# Patient Record
Sex: Male | Born: 1960 | Race: White | Hispanic: No | Marital: Married | Smoking: Former smoker
Health system: Southern US, Community
[De-identification: ages and names within clinical notes are randomized; demographics above are authoritative.]

## PROBLEM LIST (undated history)

## (undated) DIAGNOSIS — Z9861 Coronary angioplasty status: Secondary | ICD-10-CM

## (undated) DIAGNOSIS — M545 Low back pain, unspecified: Secondary | ICD-10-CM

## (undated) DIAGNOSIS — M179 Osteoarthritis of knee, unspecified: Secondary | ICD-10-CM

## (undated) DIAGNOSIS — M1710 Unilateral primary osteoarthritis, unspecified knee: Secondary | ICD-10-CM

## (undated) DIAGNOSIS — G5603 Carpal tunnel syndrome, bilateral upper limbs: Secondary | ICD-10-CM

## (undated) DIAGNOSIS — J449 Chronic obstructive pulmonary disease, unspecified: Principal | ICD-10-CM

## (undated) DIAGNOSIS — I2510 Atherosclerotic heart disease of native coronary artery without angina pectoris: Secondary | ICD-10-CM

## (undated) DIAGNOSIS — I213 ST elevation (STEMI) myocardial infarction of unspecified site: Secondary | ICD-10-CM

## (undated) HISTORY — DX: Atherosclerotic heart disease of native coronary artery without angina pectoris: I25.10

## (undated) HISTORY — DX: Low back pain: M54.5

## (undated) HISTORY — DX: ST elevation (STEMI) myocardial infarction of unspecified site: I21.3

## (undated) HISTORY — DX: Chronic obstructive pulmonary disease, unspecified: J44.9

## (undated) HISTORY — DX: Carpal tunnel syndrome, bilateral upper limbs: G56.03

## (undated) HISTORY — PX: HERNIA REPAIR: SHX51

## (undated) HISTORY — DX: Low back pain, unspecified: M54.50

## (undated) HISTORY — DX: Unilateral primary osteoarthritis, unspecified knee: M17.10

## (undated) HISTORY — DX: Coronary angioplasty status: Z98.61

## (undated) HISTORY — DX: Osteoarthritis of knee, unspecified: M17.9

## (undated) HISTORY — PX: TONSILLECTOMY: SUR1361

---

## 2005-04-26 HISTORY — PX: POSTERIOR LAMINECTOMY / DECOMPRESSION LUMBAR SPINE: SUR740

## 2005-08-11 ENCOUNTER — Ambulatory Visit (HOSPITAL_COMMUNITY): Admission: RE | Admit: 2005-08-11 | Discharge: 2005-08-11 | Payer: Self-pay | Admitting: Neurosurgery

## 2005-09-08 ENCOUNTER — Ambulatory Visit (HOSPITAL_COMMUNITY): Admission: RE | Admit: 2005-09-08 | Discharge: 2005-09-09 | Payer: Self-pay | Admitting: Neurosurgery

## 2007-03-28 ENCOUNTER — Encounter: Admission: RE | Admit: 2007-03-28 | Discharge: 2007-03-28 | Payer: Self-pay | Admitting: General Surgery

## 2007-03-30 ENCOUNTER — Ambulatory Visit (HOSPITAL_BASED_OUTPATIENT_CLINIC_OR_DEPARTMENT_OTHER): Admission: RE | Admit: 2007-03-30 | Discharge: 2007-03-30 | Payer: Self-pay | Admitting: General Surgery

## 2009-03-14 ENCOUNTER — Encounter: Admission: RE | Admit: 2009-03-14 | Discharge: 2009-03-14 | Payer: Self-pay | Admitting: Internal Medicine

## 2010-09-08 NOTE — Op Note (Signed)
Gordon Hayes, Gordon Hayes               ACCOUNT NO.:  [id number]   MEDICAL RECORD NO.:  [id number]          PATIENT TYPE:  AMB   LOCATION:  DSC                          FACILITY:  [facility]   PHYSICIAN:  Cherylynn Ridges, M.D.    DATE OF BIRTH:  Jun 17, 1960   DATE OF PROCEDURE:  03/30/2007  DATE OF DISCHARGE:                               OPERATIVE REPORT   PREOPERATIVE DIAGNOSIS:  Right inguinal hernia.   POSTOPERATIVE DIAGNOSIS:  Indirect right inguinal hernia.   PROCEDURE:  Repair of right inguinal hernia with mesh.   SURGEON:  Cherylynn Ridges, M.D.   ASSISTANT:  None.   ANESTHESIA:  General with a laryngeal airway.   ESTIMATED BLOOD LOSS:  Less than 20 mL.   COMPLICATIONS:  None.   CONDITION:  Good.   INDICATIONS FOR OPERATION:  The patient is a 50 year old with a  symptomatic right inguinal hernia who comes in for repair.   FINDINGS:  The patient had an indirect sac which was moderate in size.  The floor was weakened but not significantly herniated.   OPERATION:  The patient was taken to the operating room, placed on the  table in supine position.  After adequate general laryngeal airway  anesthetic was administered, he was prepped and draped in the usual  sterile manner exposing the right lower quadrant and right inguinal  area.   A transverse curvilinear incision was made using #10 blade into the  subcutaneous tissue at the level superficial ring.  He was taken down  through the subcutaneous tissue to the fascia of the external oblique  which was incised with cautery medially which had to be repaired with a  running 3-0 Vicryl suture.  We then exposed the fascia extending down to  the superficial ring.  We nicked the fascia along its fibers and then  opened up the fascia down through the superficial ring and then  proximally using Metzenbaum scissors.  We mobilized the spermatic cord  along with the hernia sac at the pubic tubercle and mobilized it with a  Penrose drain.  We  did not place it up on a work bench using the Penrose  drain and dissected the sac away from the anterior medial aspect of the  spermatic cord away from the spermatic cord itself.  A moderate sized  scarred sac was excised and we subsequently suture ligated it at its  base using interrupted 0 Ethibond sutures.  A total of two suture  ligatures were used at the base of the sac.  Once this was excised and  allowed to retract back into the internal ring, we placed an oval piece  of mesh measuring approximately 4 x 2 cm in size, attaching it to the  pubic tubercle and the conjoined tendon anterior medially and the  reflected portion of inguinal ligament inferolaterally, both running 0  Prolene sutures were used to attach the mesh.  The mesh had been soaked  in antibiotic solution prior to being implanted.  Once we had completed  the repair there, we closed the fascia on top of the spermatic cord  using a running 3-0 Vicryl suture.  We irrigated with antibiotic  solution again.  We closed Scarpa's fascia using interrupted 3-0 Vicryl  sutures.  We injected 50% Marcaine without epinephrine into the wound, a  total of 17-18 mL were used including a partial regional block at the  anterior-superior iliac spine.  The skin was closed using a running  subcuticular stitch of 4-0 Monocryl.  We applied Dermabond and Steri-  Strips and a Tegaderm dressing for closure.  All counts were correct.      Cherylynn Ridges, M.D.  Electronically Signed     JOW/MEDQ  D:  03/30/2007  T:  03/30/2007  Job:  [id number]   cc:   Brett Canales A. Cleta Alberts, M.D.

## 2010-09-11 NOTE — Op Note (Signed)
NAMEDONACIANO, RANGE NO.:  [id number]   MEDICAL RECORD NO.:  [id number]          PATIENT TYPE:  OIB   LOCATION:  3021                         FACILITY:  [facility]   PHYSICIAN:  Hewitt Shorts, M.D.DATE OF BIRTH:  1960/07/12   DATE OF PROCEDURE:  09/08/2005  DATE OF DISCHARGE:                                 OPERATIVE REPORT   PREOPERATIVE DIAGNOSES:  1.  Bilateral L4-5 lumbar disk herniation.  2.  Lumbar degenerative disk disease.  3.  Lumbar spondylosis and bilateral lumbar radiculopathy.   POSTOPERATIVE DIAGNOSES:  1.  Bilateral L4-5 lumbar disk herniation.  2.  Lumbar degenerative disk disease.  3.  Lumbar spondylosis and bilateral lumbar radiculopathy.   PROCEDURE:  1.  Left L4-5 hemilaminectomy and left L4-5 diskectomy with microdissection      with decompression of the left L4-5 nerve roots.  2.  Right L4 hemilaminotomy and right L4-5 diskectomy with microdissection      and decompression of the right L5 nerve root.   SURGEON:  Hewitt Shorts, M.D.   ASSISTANT:  Danae Orleans. Venetia Maxon, M.D.   ANESTHESIA:  General endotracheal.   INDICATIONS:  Patient is a 50 year old man who presented with bilateral  lumbar radiculopathy, right worse than left.  He was evaluated with an MRI  scan and myelogram and post myelogram CT scan.  This showed a large L4-5  disk herniation with bilateral compression at the L4-5 level, somewhat worse  on the left than the right side.  As well, he had a large fragment that had  migrated rostrally behind the body of L4 and was extending into the left L4-  5 neural foramen, compressing the exiting left L4 nerve roots.  The decision  was made to proceed with decompression.   PROCEDURE:  The patient was brought to the operating room, placed under  general endotracheal anesthesia.  The patient was turned to the prone  position.  The lumbar region was prepped with Betadine soap and solution and  draped in a sterile fashion.  The  midline was infiltrated with local  anesthetic with epinephrine.  An x-ray was taken.  The L4-5 level  identified, and the midline incision is made, carried down through the  subcutaneous tissue.  Bipolar cautery, electrocautery was used to maintain  hemostasis.  Dissection was carried down to the lumbar fascia, which was  incised bilaterally, and the paraspinous muscles with dissection of the  spinous process of the lamina in a subperiosteal fashion.  Greater exposure  was carried out on the left side so that we could expose the left L4 nerve  root.  The microscope was then draped and brought into the field to provide  additional magnification and illumination and visualization, and  decompression was performed using microdissection and microsurgical  technique.   A left L4 hemilaminectomy was performed, and a right L4 hemilaminotomy was  performed using the ExMax drill and Kerrison punches. The edges of the bone  were waxed as needed to maintain hemostasis.  The edges of the bone were  also rounded.  We then examined and identified the  L5 nerve roots  bilaterally as well as the left L4 nerve root as it exited into the neural  foramen.  Bilateral disk herniation was noted at the L4-5 level as well as a  large disk herniation within the left L4-5 neural foramen.  Diskectomy was  performed bilaterally at the L4-5 level with incision of the annulus  continued with a variety of microcurettes and pituitary rongeurs.  Thorough  diskectomy  was performed with removal of all loose disk fragments of both  the disk space and the epidural space with good decompression of the thecal  sac and the L5 nerve roots at the level of the disk space.  We then turned  our attention to the L4 nerve root on the left side.  The disk herniation  was identified.  This was carefully removed with decompression of the  exiting left L4 nerve root.  A thorough diskectomy was performed, with  again, removal of all disk  fragments from the epidural space and disk space.  Good decompression of the thecal sac and nerve root were achieved.  Then we  established hemostasis with the use of bipolar cautery as well as Gelfoam  soaked in thrombin providing closure.  All of the Gelfoam was removed, and  good hemostasis was confirmed.  We then instilled 2cc of fentanyl and 80 mg  of Depo-Medrol into the epidural space and then proceeded with closure.  The  deep fascia was closed with interrupted, undyed #1 Vicryl sutures, and  Scarpa's fascia was closed with interrupted inverted, undyed #1 Vicryl  sutures, and the subcutaneous and subcuticular layers closed with  interrupted, inverted 2-0 Vicryl sutures.  The skin was reapproximated with  Dermabond, and the wound was dressed with Adaptic and sterile gauze.  Following surgery, the patient was turned back to a supine position to be  reversed, and was extubated and transferred to the recovery room for further  care.      Hewitt Shorts, M.D.  Electronically Signed     RWN/MEDQ  D:  09/08/2005  T:  09/08/2005  Job:  [id number]

## 2011-02-01 LAB — DIFFERENTIAL
Eosinophils Absolute: 0.1 — ABNORMAL LOW
Monocytes Absolute: 0.5
Neutro Abs: 4

## 2011-02-01 LAB — CBC
Hemoglobin: 14.7
MCHC: 34.4
MCV: 91.7
WBC: 6.7

## 2011-04-27 DIAGNOSIS — J449 Chronic obstructive pulmonary disease, unspecified: Secondary | ICD-10-CM

## 2011-04-27 HISTORY — DX: Chronic obstructive pulmonary disease, unspecified: J44.9

## 2011-09-29 ENCOUNTER — Ambulatory Visit: Payer: BC Managed Care – PPO

## 2011-09-29 ENCOUNTER — Ambulatory Visit (INDEPENDENT_AMBULATORY_CARE_PROVIDER_SITE_OTHER): Payer: BC Managed Care – PPO | Admitting: Family Medicine

## 2011-09-29 VITALS — BP 116/79 | HR 75 | Temp 98.6°F | Resp 16 | Ht 69.0 in | Wt 181.0 lb

## 2011-09-29 DIAGNOSIS — R05 Cough: Secondary | ICD-10-CM

## 2011-09-29 DIAGNOSIS — R059 Cough, unspecified: Secondary | ICD-10-CM

## 2011-09-29 DIAGNOSIS — R062 Wheezing: Secondary | ICD-10-CM

## 2011-09-29 DIAGNOSIS — H6120 Impacted cerumen, unspecified ear: Secondary | ICD-10-CM

## 2011-09-29 DIAGNOSIS — J40 Bronchitis, not specified as acute or chronic: Secondary | ICD-10-CM

## 2011-09-29 LAB — POCT CBC
Granulocyte percent: 69 %G (ref 37–80)
HCT, POC: 51.5 % (ref 43.5–53.7)
Hemoglobin: 16.8 g/dL (ref 14.1–18.1)
Lymph, poc: 2.2 (ref 0.6–3.4)
MCH, POC: 30.2 pg (ref 27–31.2)
MCHC: 32.6 g/dL (ref 31.8–35.4)
MCV: 92.6 fL (ref 80–97)
MID (cbc): 1 — AB (ref 0–0.9)
MPV: 9.2 fL (ref 0–99.8)
POC Granulocyte: 7.2 — AB (ref 2–6.9)
POC LYMPH PERCENT: 21 %L (ref 10–50)
POC MID %: 10 %M (ref 0–12)
Platelet Count, POC: 300 10*3/uL (ref 142–424)
RBC: 5.56 M/uL (ref 4.69–6.13)
RDW, POC: 14.6 %
WBC: 10.5 10*3/uL — AB (ref 4.6–10.2)

## 2011-09-29 LAB — GLUCOSE, POCT (MANUAL RESULT ENTRY): POC Glucose: 65 mg/dl — AB (ref 70–99)

## 2011-09-29 MED ORDER — PREDNISONE 20 MG PO TABS: ORAL_TABLET | ORAL | Status: AC

## 2011-09-29 MED ORDER — IPRATROPIUM BROMIDE 0.02 % IN SOLN
0.5000 mg | Freq: Once | RESPIRATORY_TRACT | Status: AC
  Administered 2011-09-29: 0.5 mg via RESPIRATORY_TRACT

## 2011-09-29 MED ORDER — LEVOFLOXACIN 750 MG PO TABS: 750.0000 mg | ORAL_TABLET | Freq: Every day | ORAL | Status: AC

## 2011-09-29 MED ORDER — ALBUTEROL SULFATE HFA 108 (90 BASE) MCG/ACT IN AERS: 2.0000 | INHALATION_SPRAY | RESPIRATORY_TRACT | Status: DC | PRN

## 2011-09-29 MED ORDER — ALBUTEROL SULFATE (2.5 MG/3ML) 0.083% IN NEBU
2.5000 mg | INHALATION_SOLUTION | Freq: Once | RESPIRATORY_TRACT | Status: AC
  Administered 2011-09-29: 2.5 mg via RESPIRATORY_TRACT

## 2011-09-29 NOTE — Progress Notes (Signed)
  Subjective:    Patient ID: Gordon Hayes, male    DOB: October 19, 1960, 51 y.o.   MRN: [id number]  HPI Patient presents with cough, chest tightness, and wheezing since 09/26/11. Also complains of fatigue but this has been present for "months" due to long hours. He has been taking dayquil which helped with his upper respiratory symptoms but now he has tightness and pressure in his chest. Denies fever, chills, nausea, vomiting, otalgia, sinus pain/pressure, or dizziness.     Review of Systems  All other systems reviewed and are negative.       Objective:   Physical Exam  Vitals reviewed. Constitutional: He is oriented to person, place, and time. He appears well-developed and well-nourished.  HENT:  Head: Normocephalic and atraumatic.  Right Ear: Hearing, tympanic membrane and external ear normal.  Left Ear: Hearing, tympanic membrane, external ear and ear canal normal.  Mouth/Throat: No oropharyngeal exudate.       Right cerumen impaction. Normal TM s/p ear irrigation  Eyes: Conjunctivae are normal.  Neck: Neck supple.  Cardiovascular: Normal rate, regular rhythm and normal heart sounds.   Pulmonary/Chest: Effort normal. He has wheezes.       Wheezes improved s/p atrovent/albuterol nebulizer tx  Lymphadenopathy:    He has no cervical adenopathy.  Neurological: He is alert and oriented to person, place, and time.  Psychiatric: He has a normal mood and affect. His behavior is normal. Judgment and thought content normal.    [facility] reading (PRIMARY) by  Dr. Georgiana Shore as increased markings, no acute infiltration or consolidation.   Results for orders placed in visit on 09/29/11  POCT CBC      Component Value Range   WBC 10.5 (*) 4.6 - 10.2 (K/uL)   Lymph, poc 2.2  0.6 - 3.4    POC LYMPH PERCENT 21.0  10 - 50 (%L)   MID (cbc) 1.0 (*) 0 - 0.9    POC MID % 10.0  0 - 12 (%M)   POC Granulocyte 7.2 (*) 2 - 6.9    Granulocyte percent 69.0  37 - 80 (%G)   RBC 5.56  4.69 - 6.13 (M/uL)   Hemoglobin 16.8  14.1 - 18.1 (g/dL)   HCT, POC 78.2  95.6 - 53.7 (%)   MCV 92.6  80 - 97 (fL)   MCH, POC 30.2  27 - 31.2 (pg)   MCHC 32.6  31.8 - 35.4 (g/dL)   RDW, POC 21.3     Platelet Count, POC 300  142 - 424 (K/uL)   MPV 9.2  0 - 99.8 (fL)  GLUCOSE, POCT (MANUAL RESULT ENTRY)      Component Value Range   POC Glucose 65 (*) 70 - 99 (mg/dl)    Patient reports improvement of chest tightness and breathing after nebulizer. PO2 increased to 96% after nebulizer.      Assessment & Plan:   1. Cough  Bronchitis or possible early pneumonia. Will start Levaquin 750 mg daily x 5 days DG Chest 2 View, POCT CBC  2. Wheezing  Use albuterol as needed for wheezing/chest tightness ipratropium (ATROVENT) nebulizer solution 0.5 mg, albuterol (PROVENTIL) (2.5 MG/3ML) 0.083% nebulizer solution 2.5 mg  3. Bronchitis  Return for re-evaluation if no improvement in 48 hours, sooner if worse.

## 2011-09-29 NOTE — Patient Instructions (Addendum)
Discontinue use of Q-tips, hair pins, keys, etc.   To help prevent cerumen impaction: While in the washing hair in the shower, allow soapy water to run into ear canal for a few minutes and then rinse (it dissolves the wax like a dishwasher dissolves grease).   May also use half hydrogen peroxide half water in the shower 2-3 times per week to help rinse out the wax. DO NOT USE 100% HYDROGEN PEROXIDE (this can burn the skin).   Also, several drops of sweet oil can be used to help prevent itching of the canal. If this is not enough to decrease itch, you may put a small amount of OTC hydrocortisone cream on pinky finger and apply to portion of canal that can be reached with tip of finger.  Recommend OTC Debrox or Colace to prevent impaction.    Smoking Cessation This document explains the best ways for you to quit smoking and new treatments to help. It lists new medicines that can double or triple your chances of quitting and quitting for good. It also considers ways to avoid relapses and concerns you may have about quitting, including weight gain. NICOTINE: A POWERFUL ADDICTION If you have tried to quit smoking, you know how hard it can be. It is hard because nicotine is a very addictive drug. For some people, it can be as addictive as heroin or cocaine. Usually, people make 2 or 3 tries, or more, before finally being able to quit. Each time you try to quit, you can learn about what helps and what hurts. Quitting takes hard work and a lot of effort, but you can quit smoking. QUITTING SMOKING IS ONE OF THE MOST IMPORTANT THINGS YOU WILL EVER DO.  You will live longer, feel better, and live better.   The impact on your body of quitting smoking is felt almost immediately:   Within 20 minutes, blood pressure decreases. Pulse returns to its normal level.   After 8 hours, carbon monoxide levels in the blood return to normal. Oxygen level increases.   After 24 hours, chance of heart attack starts to  decrease. Breath, hair, and body stop smelling like smoke.   After 48 hours, damaged nerve endings begin to recover. Sense of taste and smell improve.   After 72 hours, the body is virtually free of nicotine. Bronchial tubes relax and breathing becomes easier.   After 2 to 12 weeks, lungs can hold more air. Exercise becomes easier and circulation improves.   Quitting will reduce your risk of having a heart attack, stroke, cancer, or lung disease:   After 1 year, the risk of coronary heart disease is cut in half.   After 5 years, the risk of stroke falls to the same as a nonsmoker.   After 10 years, the risk of lung cancer is cut in half and the risk of other cancers decreases significantly.   After 15 years, the risk of coronary heart disease drops, usually to the level of a nonsmoker.   If you are pregnant, quitting smoking will improve your chances of having a healthy baby.   The people you live with, especially your children, will be healthier.   You will have extra money to spend on things other than cigarettes.  FIVE KEYS TO QUITTING Studies have shown that these 5 steps will help you quit smoking and quit for good. You have the best chances of quitting if you use them together: 1. Get ready.  2. Get support and encouragement.  3. Learn new skills and behaviors.  4. Get medicine to reduce your nicotine addiction and use it correctly.  5. Be prepared for relapse or difficult situations. Be determined to continue trying to quit, even if you do not succeed at first.  1. GET READY  Set a quit date.   Change your environment.   Get rid of ALL cigarettes, ashtrays, matches, and lighters in your home, car, and place of work.   Do not let people smoke in your home.   Review your past attempts to quit. Think about what worked and what did not.   Once you quit, do not smoke. NOT EVEN A PUFF!  2. GET SUPPORT AND ENCOURAGEMENT Studies have shown that you have a better chance of  being successful if you have help. You can get support in many ways.  Tell your family, friends, and coworkers that you are going to quit and need their support. Ask them not to smoke around you.   Talk to your caregivers (doctor, dentist, nurse, pharmacist, psychologist, and/or smoking counselor).   Get individual, group, or telephone counseling and support. The more counseling you have, the better your chances are of quitting. Programs are available at [employer] and health centers. Call your local health department for information about programs in your area.   Spiritual beliefs and practices may help some smokers quit.   Quit meters are Photographer that keep track of quit statistics, such as amount of "quit-time," cigarettes not smoked, and money saved.   Many smokers find one or more of the many self-help books available useful in helping them quit and stay off tobacco.  3. LEARN NEW SKILLS AND BEHAVIORS  Try to distract yourself from urges to smoke. Talk to someone, go for a walk, or occupy your time with a task.   When you first try to quit, change your routine. Take a different route to work. Drink tea instead of coffee. Eat breakfast in a different place.   Do something to reduce your stress. Take a hot bath, exercise, or read a book.   Plan something enjoyable to do every day. Reward yourself for not smoking.   Explore interactive web-based programs that specialize in helping you quit.  4. GET MEDICINE AND USE IT CORRECTLY Medicines can help you stop smoking and decrease the urge to smoke. Combining medicine with the above behavioral methods and support can quadruple your chances of successfully quitting smoking. The [place] Food and Drug Administration (FDA) has approved 7 medicines to help you quit smoking. These medicines fall into 3 categories.  Nicotine replacement therapy (delivers nicotine to your body without the negative effects and  risks of smoking):   Nicotine gum: Available over-the-counter.   Nicotine lozenges: Available over-the-counter.   Nicotine inhaler: Available by prescription.   Nicotine nasal spray: Available by prescription.   Nicotine skin patches (transdermal): Available by prescription and over-the-counter.   Antidepressant medicine (helps people abstain from smoking, but how this works is unknown):   Bupropion sustained-release (SR) tablets: Available by prescription.   Nicotinic receptor partial agonist (simulates the effect of nicotine in your brain):   Varenicline tartrate tablets: Available by prescription.   Ask your caregiver for advice about which medicines to use and how to use them. Carefully read the information on the package.   Everyone who is trying to quit may benefit from using a medicine. If you are pregnant or trying to become pregnant, nursing an infant, you  are under age 61, or you smoke fewer than 10 cigarettes per day, talk to your caregiver before taking any nicotine replacement medicines.   You should stop using a nicotine replacement product and call your caregiver if you experience nausea, dizziness, weakness, vomiting, fast or irregular heartbeat, mouth problems with the lozenge or gum, or redness or swelling of the skin around the patch that does not go away.   Do not use any other product containing nicotine while using a nicotine replacement product.   Talk to your caregiver before using these products if you have diabetes, heart disease, asthma, stomach ulcers, you had a recent heart attack, you have high blood pressure that is not controlled with medicine, a history of irregular heartbeat, or you have been prescribed medicine to help you quit smoking.  5. BE PREPARED FOR RELAPSE OR DIFFICULT SITUATIONS  Most relapses occur within the first 3 months after quitting. Do not be discouraged if you start smoking again. Remember, most people try several times before they  finally quit.   You may have symptoms of withdrawal because your body is used to nicotine. You may crave cigarettes, be irritable, feel very hungry, cough often, get headaches, or have difficulty concentrating.   The withdrawal symptoms are only temporary. They are strongest when you first quit, but they will go away within 10 to 14 days.  Here are some difficult situations to watch for:  Alcohol. Avoid drinking alcohol. Drinking lowers your chances of successfully quitting.   Caffeine. Try to reduce the amount of caffeine you consume. It also lowers your chances of successfully quitting.   Other smokers. Being around smoking can make you want to smoke. Avoid smokers.   Weight gain. Many smokers will gain weight when they quit, usually less than 10 pounds. Eat a healthy diet and stay active. Do not let weight gain distract you from your main goal, quitting smoking. Some medicines that help you quit smoking may also help delay weight gain. You can always lose the weight gained after you quit.   Bad mood or depression. There are a lot of ways to improve your mood other than smoking.  If you are having problems with any of these situations, talk to your caregiver. SPECIAL SITUATIONS AND CONDITIONS Studies suggest that everyone can quit smoking. Your situation or condition can give you a special reason to quit.  Pregnant women/new mothers: By quitting, you protect your baby's health and your own.   Hospitalized patients: By quitting, you reduce health problems and help healing.   Heart attack patients: By quitting, you reduce your risk of a second heart attack.   Lung, head, and neck cancer patients: By quitting, you reduce your chance of a second cancer.   Parents of children and adolescents: By quitting, you protect your children from illnesses caused by secondhand smoke.  QUESTIONS TO THINK ABOUT Think about the following questions before you try to stop smoking. You may want to talk  about your answers with your caregiver.  Why do you want to quit?   If you tried to quit in the past, what helped and what did not?   What will be the most difficult situations for you after you quit? How will you plan to handle them?   Who can help you through the tough times? Your family? Friends? Caregiver?   What pleasures do you get from smoking? What ways can you still get pleasure if you quit?  Here are some questions to ask  your caregiver:  How can you help me to be successful at quitting?   What medicine do you think would be best for me and how should I take it?   What should I do if I need more help?   What is smoking withdrawal like? How can I get information on withdrawal?  Quitting takes hard work and a lot of effort, but you can quit smoking. FOR MORE INFORMATION  Smokefree.gov (http://www.davis-sullivan.com/) provides free, accurate, evidence-based information and professional assistance to help support the immediate and long-term needs of people trying to quit smoking. Document Released: 04/06/2001 Document Revised: 04/01/2011 Document Reviewed: 01/27/2009 [facility] Patient Information 2012 [place], [place].

## 2011-09-29 NOTE — Progress Notes (Signed)
  Subjective:    Patient ID: Gordon Hayes, male    DOB: 05-30-1960, 51 y.o.   MRN: [id number]  HPI    Review of Systems     Objective:   Physical Exam        Assessment & Plan:

## 2011-11-23 ENCOUNTER — Ambulatory Visit (INDEPENDENT_AMBULATORY_CARE_PROVIDER_SITE_OTHER): Payer: BC Managed Care – PPO | Admitting: Emergency Medicine

## 2011-11-23 ENCOUNTER — Encounter: Payer: Self-pay | Admitting: Physician Assistant

## 2011-11-23 ENCOUNTER — Ambulatory Visit: Payer: BC Managed Care – PPO

## 2011-11-23 VITALS — BP 130/80 | HR 78 | Temp 98.3°F | Resp 16 | Ht 70.5 in | Wt 183.2 lb

## 2011-11-23 DIAGNOSIS — Z1211 Encounter for screening for malignant neoplasm of colon: Secondary | ICD-10-CM

## 2011-11-23 DIAGNOSIS — R05 Cough: Secondary | ICD-10-CM

## 2011-11-23 DIAGNOSIS — F17200 Nicotine dependence, unspecified, uncomplicated: Secondary | ICD-10-CM

## 2011-11-23 DIAGNOSIS — Z0000 Encounter for general adult medical examination without abnormal findings: Secondary | ICD-10-CM

## 2011-11-23 DIAGNOSIS — Z125 Encounter for screening for malignant neoplasm of prostate: Secondary | ICD-10-CM

## 2011-11-23 DIAGNOSIS — R5381 Other malaise: Secondary | ICD-10-CM

## 2011-11-23 DIAGNOSIS — R5383 Other fatigue: Secondary | ICD-10-CM

## 2011-11-23 DIAGNOSIS — J449 Chronic obstructive pulmonary disease, unspecified: Secondary | ICD-10-CM | POA: Insufficient documentation

## 2011-11-23 DIAGNOSIS — R059 Cough, unspecified: Secondary | ICD-10-CM

## 2011-11-23 DIAGNOSIS — Z720 Tobacco use: Secondary | ICD-10-CM

## 2011-11-23 DIAGNOSIS — Z23 Encounter for immunization: Secondary | ICD-10-CM

## 2011-11-23 DIAGNOSIS — J4489 Other specified chronic obstructive pulmonary disease: Secondary | ICD-10-CM

## 2011-11-23 LAB — COMPREHENSIVE METABOLIC PANEL
ALT: 17 U/L (ref 0–53)
Albumin: 4.3 g/dL (ref 3.5–5.2)
Alkaline Phosphatase: 46 U/L (ref 39–117)
CO2: 21 mEq/L (ref 19–32)
Chloride: 108 mEq/L (ref 96–112)
Creat: 0.87 mg/dL (ref 0.50–1.35)
Sodium: 138 mEq/L (ref 135–145)

## 2011-11-23 LAB — LIPID PANEL
Cholesterol: 162 mg/dL (ref 0–200)
Total CHOL/HDL Ratio: 2.7 Ratio
Triglycerides: 64 mg/dL (ref <150)
VLDL: 13 mg/dL (ref 0–40)

## 2011-11-23 LAB — CBC WITH DIFFERENTIAL/PLATELET
Basophils Absolute: 0 10*3/uL (ref 0.0–0.1)
Basophils Relative: 1 % (ref 0–1)
Eosinophils Absolute: 0.1 10*3/uL (ref 0.0–0.7)
Eosinophils Relative: 1 % (ref 0–5)
HCT: 45.8 % (ref 39.0–52.0)
Monocytes Absolute: 0.4 10*3/uL (ref 0.1–1.0)
Neutro Abs: 3 10*3/uL (ref 1.7–7.7)
Platelets: 270 10*3/uL (ref 150–400)
RBC: 5.15 MIL/uL (ref 4.22–5.81)
RDW: 14.3 % (ref 11.5–15.5)

## 2011-11-23 LAB — POCT UA - MICROSCOPIC ONLY: Casts, Ur, LPF, POC: NEGATIVE

## 2011-11-23 LAB — IFOBT (OCCULT BLOOD): IFOBT: NEGATIVE

## 2011-11-23 LAB — POCT URINALYSIS DIPSTICK
Bilirubin, UA: NEGATIVE
Glucose, UA: NEGATIVE
Nitrite, UA: NEGATIVE
Urobilinogen, UA: 0.2

## 2011-11-23 LAB — TSH: TSH: 1.108 u[IU]/mL (ref 0.350–4.500)

## 2011-11-23 MED ORDER — TIOTROPIUM BROMIDE MONOHYDRATE 18 MCG IN CAPS: 18.0000 ug | ORAL_CAPSULE | Freq: Every day | RESPIRATORY_TRACT | Status: DC

## 2011-11-23 NOTE — Progress Notes (Signed)
Subjective:    Patient ID: Gordon Hayes, male    DOB: May 15, 1960, 51 y.o.   MRN: [id number]  HPI This 51 y.o. Male presents for CPE.  Review of Systems  Constitutional: Negative.   HENT: Negative.  Rhinorrhea: with lawn mowing.   Eyes: Negative.   Respiratory: Positive for cough (daily).        Dyspnea with exertion.  Cardiovascular: Negative.   Gastrointestinal: Negative.   Genitourinary: Negative.   Musculoskeletal: Positive for arthralgias (hands, knees, back).  Skin: Negative.   Neurological: Negative.   Hematological: Negative.   Psychiatric/Behavioral: Negative.     Past Medical History  Diagnosis Date  . Back pain, lumbosacral   . DJD (degenerative joint disease) of knee   . Carpal tunnel syndrome, bilateral     Past Surgical History  Procedure Date  . Posterior laminectomy / decompression lumbar spine 2007  . Hernia repair     right inguinal  . Tonsillectomy     Prior to Admission medications   Medication Sig Start Date End Date Taking? Authorizing Provider  albuterol (PROVENTIL HFA;VENTOLIN HFA) 108 (90 BASE) MCG/ACT inhaler Inhale 2 puffs into the lungs every 4 (four) hours as needed for wheezing (cough, shortness of breath or wheezing.). 09/29/11 09/28/12 Yes Heather Jaquita Rector, PA-C   Allergies  Allergen Reactions  . Penicillins Anaphylaxis    History   Social History  . Marital Status: Married    Spouse Name: Asher Muir    Number of Children: 0  . 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 Smoking status: Current Everyday Smoker -- 1.5 packs/day    Types: Cigarettes  . Smokeless tobacco: Former Neurosurgeon   Comment: trying to-"you gotta have some will power.  I don't have my mind right to do it yet."  . Alcohol Use: 0.0 - 2.4 oz/week    0-4 Cans of beer per week  . Drug Use: No  . Sexually Active: Yes -- Male partner(s)   Family History  Problem Relation Age of Onset  . Hearing  loss Mother   . Cancer Father 87    prostate, bladder  . Heart disease Father   . Stroke Father   . Cancer Maternal Grandfather   . Cancer Paternal Grandmother   . Heart disease Paternal Grandfather       Objective:   Physical Exam  Vitals reviewed. Constitutional: He is oriented to person, place, and time. Vital signs are normal. He appears well-developed and well-nourished.  Non-toxic appearance. He does not have a sickly appearance. He does not appear ill. No distress.  HENT:  Head: Normocephalic and atraumatic. No trismus in the jaw.  Right Ear: Hearing, tympanic membrane, external ear and ear canal normal.  Left Ear: Hearing, tympanic membrane, external ear and ear canal normal.  Nose: Nose normal.  Mouth/Throat: Uvula is midline, oropharynx is clear and moist and mucous membranes are normal. He does not have dentures. No oral lesions. Normal dentition. No dental abscesses, uvula swelling, lacerations or dental caries.  Eyes: Conjunctivae and EOM are normal. Pupils are equal, round, and reactive to light. Right eye exhibits no discharge. Left eye exhibits no discharge. No scleral icterus.  Fundoscopic exam:      The right eye shows no arteriolar narrowing, no AV nicking, no exudate, no hemorrhage and no papilledema. The right eye shows red reflex.The right eye shows no venous pulsations.  The left eye shows no arteriolar narrowing, no AV nicking, no exudate, no hemorrhage and no papilledema. The left eye shows red reflex.The left eye shows no venous pulsations. Neck: Normal range of motion, full passive range of motion without pain and phonation normal. Neck supple. No spinous process tenderness and no muscular tenderness present. No rigidity. No tracheal deviation, no edema, no erythema and normal range of motion present. No thyromegaly present.  Cardiovascular: Normal rate, regular rhythm, S1 normal, S2 normal, normal heart sounds, intact distal pulses and normal pulses.  Exam  reveals no gallop and no friction rub.   No murmur heard. Pulmonary/Chest: Effort normal and breath sounds normal. No respiratory distress. He has no wheezes. He has no rales.  Abdominal: Soft. Normal appearance and bowel sounds are normal. He exhibits no distension and no mass. There is no hepatosplenomegaly. There is no tenderness. There is no rebound and no guarding. No hernia. Hernia confirmed negative in the right inguinal area and confirmed negative in the left inguinal area.  Genitourinary: Rectum normal, prostate normal, testes normal and penis normal. Guaiac negative stool. No phimosis, paraphimosis, hypospadias, penile erythema or penile tenderness. No discharge found.  Musculoskeletal: Normal range of motion. He exhibits no edema and no tenderness.       Right shoulder: Normal.       Left shoulder: Normal.       Right elbow: Normal.      Left elbow: Normal.       Right wrist: Normal.       Left wrist: Normal.       Right hip: Normal.       Left hip: Normal.       Right knee: Normal.       Left knee: Normal.       Right ankle: Normal. Achilles tendon normal.       Left ankle: Normal. Achilles tendon normal.       Cervical back: Normal. He exhibits normal range of motion, no tenderness, no bony tenderness, no swelling, no edema, no deformity, no laceration, no pain, no spasm and normal pulse.       Thoracic back: Normal.       Lumbar back: Normal.       Right upper arm: Normal.       Left upper arm: Normal.       Right forearm: Normal.       Left forearm: Normal.       Right hand: Normal.       Left hand: Normal.       Right upper leg: Normal.       Left upper leg: Normal.       Right lower leg: Normal.       Left lower leg: Normal.       Right foot: Normal.       Left foot: Normal.  Lymphadenopathy:       Head (right side): No submental, no submandibular, no tonsillar, no preauricular, no posterior auricular and no occipital adenopathy present.       Head (left side): No  submental, no submandibular, no tonsillar, no preauricular, no posterior auricular and no occipital adenopathy present.    He has no cervical adenopathy.       Right: No inguinal and no supraclavicular adenopathy present.       Left: No inguinal and no supraclavicular adenopathy present.  Neurological: He is alert and oriented to person, place, and time. He has normal strength and  normal reflexes. He displays no tremor. No cranial nerve deficit. He exhibits normal muscle tone. Coordination and gait normal.  Skin: Skin is warm, dry and intact. No abrasion, no ecchymosis, no laceration, no lesion and no rash noted. He is not diaphoretic. No cyanosis or erythema. No pallor. Nails show no clubbing.  Psychiatric: He has a normal mood and affect. His speech is normal and behavior is normal. Judgment and thought content normal. Cognition and memory are normal.   CXR: [facility] reading (PRIMARY) by  Dr. Cleta Alberts.  Increased markings in the right hilar region, seen on both PA and lateral views are concerning given his smoking history.  On lateral view anterior markings are unusual, opacity like a curtain.  Spirometry reveals severe obstruction.  Results for orders placed in visit on 11/23/11  IFOBT (OCCULT BLOOD)      Component Value Range   IFOBT Negative    POCT UA - MICROSCOPIC ONLY      Component Value Range   WBC, Ur, HPF, POC neg     RBC, urine, microscopic 0-3     Bacteria, U Microscopic neg     Mucus, UA trace     Epithelial cells, urine per micros 0-1     Crystals, Ur, HPF, POC neg     Casts, Ur, LPF, POC neg     Yeast, UA neg    POCT URINALYSIS DIPSTICK      Component Value Range   Color, UA yellow     Clarity, UA clear     Glucose, UA neg     Bilirubin, UA neg     Ketones, UA neg     Spec Grav, UA 1.020     Blood, UA trace     pH, UA 5.5     Protein, UA neg     Urobilinogen, UA 0.2     Nitrite, UA neg     Leukocytes, UA Negative        Assessment & Plan:   1. Routine general  medical examination at a health care facility  Lipid panel, POCT UA - Microscopic Only, POCT urinalysis dipstick  2. COPD (chronic obstructive pulmonary disease)  tiotropium (SPIRIVA) 18 MCG inhalation capsule; repeat spirometry in 4 weeks.  Urged to quit smoking.  3. Need for Tdap vaccination  Tdap vaccine greater than or equal to 7yo IM  4. Screening for colon cancer  IFOBT POC (occult bld, rslt in office), Ambulatory referral to Gastroenterology for screening colonoscopy.  5. Fatigue  CBC with Differential, Comprehensive metabolic panel, TSH, Testosterone; likely due to COPD  6. Screening for prostate cancer  PSA  7. Cough  DG Chest 2 View  8. Tobacco abuse  URGED to quit   Discussed with Dr. Cleta Alberts.

## 2011-11-23 NOTE — Patient Instructions (Addendum)

## 2011-11-24 ENCOUNTER — Encounter: Payer: Self-pay | Admitting: Physician Assistant

## 2011-11-24 ENCOUNTER — Telehealth: Payer: Self-pay

## 2011-11-24 LAB — PSA: PSA: 1.2 ng/mL (ref <=4.00)

## 2011-11-24 MED ORDER — BECLOMETHASONE DIPROPIONATE 80 MCG/ACT IN AERS: 2.0000 | INHALATION_SPRAY | Freq: Two times a day (BID) | RESPIRATORY_TRACT | Status: DC

## 2011-11-24 NOTE — Telephone Encounter (Signed)
Spiriva costly for him is there any substitution?

## 2011-11-24 NOTE — Telephone Encounter (Signed)
Rx changed to Qvar and sent to pharmacy.

## 2011-11-24 NOTE — Telephone Encounter (Signed)
PT STATES HE WENT TO GET HIS MEDS FILLED AND EVEN WITH INSURANCE THE MEDICINE COST OVER $184.00 CANNOT AFFORD IT PLEASE CALL [phone]   [facility] [address]

## 2011-11-25 NOTE — Telephone Encounter (Signed)
Pt picked up rx yesterday.

## 2011-12-13 ENCOUNTER — Telehealth: Payer: Self-pay | Admitting: *Deleted

## 2011-12-13 NOTE — Telephone Encounter (Signed)
Spoke with Mrs. Gordon Hayes and she said they were not aware that any appts for colon were set up.  She will have [name] call and [facility] Previsit and Colon.

## 2011-12-15 ENCOUNTER — Encounter: Payer: Self-pay | Admitting: Gastroenterology

## 2011-12-20 ENCOUNTER — Other Ambulatory Visit: Payer: Self-pay | Admitting: Gastroenterology

## 2011-12-21 ENCOUNTER — Ambulatory Visit (INDEPENDENT_AMBULATORY_CARE_PROVIDER_SITE_OTHER): Payer: BC Managed Care – PPO | Admitting: Physician Assistant

## 2011-12-21 ENCOUNTER — Encounter: Payer: Self-pay | Admitting: Physician Assistant

## 2011-12-21 VITALS — BP 124/80 | HR 72 | Temp 98.7°F | Resp 16 | Ht 70.5 in | Wt 188.0 lb

## 2011-12-21 DIAGNOSIS — J449 Chronic obstructive pulmonary disease, unspecified: Secondary | ICD-10-CM

## 2011-12-21 MED ORDER — MOMETASONE FURO-FORMOTEROL FUM 200-5 MCG/ACT IN AERO: 1.0000 | INHALATION_SPRAY | Freq: Two times a day (BID) | RESPIRATORY_TRACT | Status: DC

## 2011-12-21 NOTE — Assessment & Plan Note (Addendum)
  Office Spirometry Results:   FVC 91% predicted FEV1 71% predicted (up from 35% last month) FEV1/FVC% 78% predicted FEF 25-75 44% predicted  Significant improvement in spirometry, but could improve still.  D/C Qvar.  Start [place].  RTC 4 weeks.

## 2011-12-21 NOTE — Progress Notes (Signed)
Subjective:    Patient ID: Gordon Hayes, male    DOB: [birth year], 51 y.o.   MRN: [id number]  HPI  This 51 y.o. male presents for evaluation of COPD since starting Qvar last month.  He was originally prescribed Spireva, but was unable to afford it.  He reports it's not helping, but that since he started taking Claritin, he's had dramatic improvement.  No rescue inhaler use. He denies SOB, dyspnea with daily activities.  Wants to quit smoking, but "I don't have the will power."    Review of Systems No chest pain, SOB, HA, dizziness, vision change, N/V, diarrhea, constipation, dysuria, urinary urgency or frequency, myalgias, arthralgias or rash.   Past Medical History  Diagnosis Date  . Back pain, lumbosacral   . DJD (degenerative joint disease) of knee   . Carpal tunnel syndrome, bilateral     Past Surgical History  Procedure Date  . Posterior laminectomy / decompression lumbar spine 2007  . Hernia repair     right inguinal  . Tonsillectomy     Prior to Admission medications   Medication Sig Start Date End Date Taking? Authorizing Provider  albuterol (PROVENTIL HFA;VENTOLIN HFA) 108 (90 BASE) MCG/ACT inhaler Inhale 2 puffs into the lungs every 4 (four) hours as needed for wheezing (cough, shortness of breath or wheezing.). 09/29/11 09/28/12 Yes Heather M Marte, PA-C  beclomethasone (QVAR) 80 MCG/ACT inhaler Inhale 2 puffs into the lungs 2 (two) times daily. 11/24/11 11/23/12 Yes Heather M Marte, PA-C  loratadine (CLARITIN) 10 MG tablet Take 10 mg by mouth daily.   Yes Historical Provider, MD  tiotropium (SPIRIVA) 18 MCG inhalation capsule Place 1 capsule (18 mcg total) into inhaler and inhale daily. 11/23/11 11/22/12  Janaisha Tolsma Tessa Lerner, PA-C    Allergies  Allergen Reactions  . Penicillins Anaphylaxis    History   Social History  . Marital Status: Married    Spouse Name: Asher Muir    Number of Children: 0  . 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 Smoking status: Current Everyday Smoker -- 1.5 packs/day    Types: Cigarettes  . Smokeless tobacco: Former Neurosurgeon   Comment: trying to-"you gotta have some will power.  I don't have my mind right to do it yet."  . Alcohol Use: 0.0 - 2.4 oz/week    0-4 Cans of beer per week  . Drug Use: No  . Sexually Active: Yes -- Male partner(s)   Other Topics Concern  . Not on file   Social History Narrative  . No narrative on file    Family History  Problem Relation Age of Onset  . Hearing loss Mother   . Cancer Father 77    prostate, bladder  . Heart disease Father   . Stroke Father   . Cancer Maternal Grandfather   . Cancer Paternal Grandmother   . Heart disease Paternal Grandfather        Objective:   Physical Exam  Blood pressure 124/80, pulse 72, temperature 98.7 F (37.1 C), temperature source Oral, resp. rate 16, height 5' 10.5" (1.791 m), weight 188 lb (85.276 kg), SpO2 95.00%. Body mass index is 26.59 kg/(m^2). Well-developed, well nourished WM who is awake, alert and oriented, in NAD. HEENT: [place]/AT, sclera and conjunctiva are clear.   Neck: supple, non-tender, no lymphadenopathy, thyromegaly. Heart: RRR, no murmur Lungs: normal effort, CTA Extremities: no cyanosis, clubbing or edema.  Skin: warm and dry without rash.  Office Spirometry Results:   FVC 91% predicted FEV1 71% predicted (up from 35% last month) FEV1/FVC% 78% predicted FEF 25-75 44% predicted      Assessment & Plan:

## 2012-01-11 ENCOUNTER — Ambulatory Visit (AMBULATORY_SURGERY_CENTER): Payer: Self-pay | Admitting: *Deleted

## 2012-01-11 ENCOUNTER — Encounter: Payer: Self-pay | Admitting: Gastroenterology

## 2012-01-11 VITALS — Ht 70.0 in | Wt 187.1 lb

## 2012-01-11 DIAGNOSIS — Z1211 Encounter for screening for malignant neoplasm of colon: Secondary | ICD-10-CM

## 2012-01-11 MED ORDER — MOVIPREP 100 G PO SOLR: 1.0000 | Freq: Once | ORAL | Status: DC

## 2012-01-20 ENCOUNTER — Encounter: Payer: Self-pay | Admitting: Physician Assistant

## 2012-01-25 ENCOUNTER — Encounter: Payer: Self-pay | Admitting: Gastroenterology

## 2012-01-25 ENCOUNTER — Ambulatory Visit (AMBULATORY_SURGERY_CENTER): Payer: BC Managed Care – PPO | Admitting: Gastroenterology

## 2012-01-25 VITALS — BP 124/88 | HR 66 | Temp 98.5°F | Resp 17 | Ht 70.0 in | Wt 187.0 lb

## 2012-01-25 DIAGNOSIS — Z1211 Encounter for screening for malignant neoplasm of colon: Secondary | ICD-10-CM

## 2012-01-25 DIAGNOSIS — D126 Benign neoplasm of colon, unspecified: Secondary | ICD-10-CM

## 2012-01-25 LAB — HM COLONOSCOPY

## 2012-01-25 MED ORDER — SODIUM CHLORIDE 0.9 % IV SOLN: 500.0000 mL | INTRAVENOUS | Status: DC

## 2012-01-25 NOTE — Progress Notes (Signed)
Patient did not experience any of the following events: a burn prior to discharge; a fall within the facility; wrong site/side/patient/procedure/implant event; or a hospital transfer or hospital admission upon discharge from the facility. (G8907) Patient did not have preoperative order for IV antibiotic SSI prophylaxis. (G8918)  

## 2012-01-25 NOTE — Patient Instructions (Addendum)
One of your biggest health concerns is your smoking.  This increases your risk for most cancers and serious cardiovascular diseases such as strokes, heart attacks.  You should try your best to stop.  If you need assistance, please contact your PCP or Smoking Cessation Class at [facility] ([phone]) or [facility] (1-800-QUIT-NOW).   YOU HAD AN ENDOSCOPIC PROCEDURE TODAY AT THE [facility]: Refer to the procedure report that was given to you for any specific questions about what was found during the examination.  If the procedure report does not answer your questions, please call your gastroenterologist to clarify.  If you requested that your care partner not be given the details of your procedure findings, then the procedure report has been included in a sealed envelope for you to review at your convenience later.  YOU SHOULD EXPECT: Some feelings of bloating in the abdomen. Passage of more gas than usual.  Walking can help get rid of the air that was put into your GI tract during the procedure and reduce the bloating. If you had a lower endoscopy (such as a colonoscopy or flexible sigmoidoscopy) you may notice spotting of blood in your stool or on the toilet paper. If you underwent a bowel prep for your procedure, then you may not have a normal bowel movement for a few days.  DIET: Your first meal following the procedure should be a light meal and then it is ok to progress to your normal diet.  A half-sandwich or bowl of soup is an example of a good first meal.  Heavy or fried foods are harder to digest and may make you feel nauseous or bloated.  Likewise meals heavy in dairy and vegetables can cause extra gas to form and this can also increase the bloating.  Drink plenty of fluids but you should avoid alcoholic beverages for 24 hours.  ACTIVITY: Your care partner should take you home directly after the procedure.  You should plan to take it easy, moving slowly for the rest of  the day.  You can resume normal activity the day after the procedure however you should NOT DRIVE or use heavy machinery for 24 hours (because of the sedation medicines used during the test).    SYMPTOMS TO REPORT IMMEDIATELY: A gastroenterologist can be reached at any hour.  During normal business hours, 8:30 AM to 5:00 PM Monday through Friday, call [phone].  After hours and on weekends, please call the GI answering service at [phone] who will take a message and have the physician on call contact you.   Following lower endoscopy (colonoscopy or flexible sigmoidoscopy):  Excessive amounts of blood in the stool  Significant tenderness or worsening of abdominal pains  Swelling of the abdomen that is new, acute  Fever of 100F or higher   FOLLOW UP: If any biopsies were taken you will be contacted by phone or by letter within the next 1-3 weeks.  Call your gastroenterologist if you have not heard about the biopsies in 3 weeks.  Our staff will call the home number listed on your records the next business day following your procedure to check on you and address any questions or concerns that you may have at that time regarding the information given to you following your procedure. This is a courtesy call and so if there is no answer at the home number and we have not heard from you through the emergency physician on call, we will assume that you have   returned to your regular daily activities without incident.  SIGNATURES/CONFIDENTIALITY: You and/or your care partner have signed paperwork which will be entered into your electronic medical record.  These signatures attest to the fact that that the information above on your After Visit Summary has been reviewed and is understood.  Full responsibility of the confidentiality of this discharge information lies with you and/or your care-partner.   INFORMATION ON POLYPS GIVEN TO YOU TODAY 

## 2012-01-25 NOTE — Op Note (Signed)
[facility] [address] [place], [address]   COLONOSCOPY PROCEDURE REPORT  PATIENT: Gordon Hayes, Gordon Hayes  MR#: [id number] BIRTHDATE: August 22, 1960 , 51  yrs. old GENDER: Male ENDOSCOPIST: Rachael Fee, MD PROCEDURE DATE:  01/25/2012 PROCEDURE:   Colonoscopy with snare polypectomy ASA CLASS:   Class III INDICATIONS:average risk screening. MEDICATIONS: Fentanyl 75 mcg IV, Versed 8 mg IV, and These medications were titrated to patient response per physician's verbal order  DESCRIPTION OF PROCEDURE:   After the risks benefits and alternatives of the procedure were thoroughly explained, informed consent was obtained.  A digital rectal exam revealed no abnormalities of the rectum.   The LB [id number]-[id number]  endoscope was introduced through the anus and advanced to the cecum, which was identified by both the appendix and ileocecal valve. No adverse events experienced.   The quality of the prep was Moviprep fair The instrument was then slowly withdrawn as the colon was fully examined.  OLON FINDINGS: Three sessile polyps were found.  These ranged in size from 3mm to 12mm across, located in ascending and transverse segments.  They were all removed with cold snare and all were sent to pathology.  The largest polyp required piecemeal technique.  The examination was otherwise normal.  Retroflexed views revealed no abnormalities. The time to cecum=1 minutes 31 seconds.  Withdrawal time=15 minutes 22 seconds.  The scope was withdrawn and the procedure completed. COMPLICATIONS: There were no complications.  ENDOSCOPIC IMPRESSION: Three polyps were found, removed and sent to pathology.  One was 12mm and required piecemeal resection.Marland Kitchen  RECOMMENDATIONS: If the polyp(s) removed today are proven to be adenomatous (pre-cancerous) polyps, you will need a colonoscopy in 6 months. You will receive a letter within 1-2 weeks with the results of your biopsy as well as final  recommendations.  Please call my office if you have not received a letter after 3 weeks.   eSigned:  Rachael Fee, MD 01/25/2012 11:48 AM

## 2012-01-26 ENCOUNTER — Telehealth: Payer: Self-pay | Admitting: *Deleted

## 2012-01-26 NOTE — Telephone Encounter (Signed)
  Follow up Call-  Call back number 01/25/2012  Post procedure Call Back phone  # [phone]  Permission to leave phone message Yes     Patient questions:  Do you have a fever, pain , or abdominal swelling? no Pain Score  0 *  Have you tolerated food without any problems? yes  Have you been able to return to your normal activities? yes  Do you have any questions about your discharge instructions: Diet   no Medications  no Follow up visit  no  Do you have questions or concerns about your Care? no  Actions: * If pain score is 4 or above: No action needed, pain <4.  Spoke with patients wife who states he has gone to work, he was fine and everything went well.

## 2012-02-01 ENCOUNTER — Encounter: Payer: Self-pay | Admitting: Physician Assistant

## 2012-02-01 ENCOUNTER — Ambulatory Visit (INDEPENDENT_AMBULATORY_CARE_PROVIDER_SITE_OTHER): Payer: BC Managed Care – PPO | Admitting: Physician Assistant

## 2012-02-01 ENCOUNTER — Encounter: Payer: Self-pay | Admitting: Gastroenterology

## 2012-02-01 VITALS — BP 154/98 | HR 79 | Temp 98.0°F | Resp 18 | Ht 70.0 in | Wt 190.0 lb

## 2012-02-01 DIAGNOSIS — Z23 Encounter for immunization: Secondary | ICD-10-CM

## 2012-02-01 DIAGNOSIS — IMO0001 Reserved for inherently not codable concepts without codable children: Secondary | ICD-10-CM

## 2012-02-01 DIAGNOSIS — J449 Chronic obstructive pulmonary disease, unspecified: Secondary | ICD-10-CM

## 2012-02-01 DIAGNOSIS — R030 Elevated blood-pressure reading, without diagnosis of hypertension: Secondary | ICD-10-CM

## 2012-02-01 NOTE — Progress Notes (Signed)
Subjective:    Patient ID: Gordon Hayes, male    DOB: [birth year], 51 y.o.   MRN: [id number]  HPI This 51 y.o. male presents for evaluation of COPD, diagnosed this summer.  He believes he doesn't have a chronic lung disease, and that the problem was allergies, which are now controlled. Is breathing better, sleeping through the night.  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 Frustrated that he was recent\ly denied application for increased life insurance due to COPD.  COntinues to smoke, but is working hard to quit.  Has elimiated the cigarette after each meal.  When I asked about his elevated BP at this visit, he states that he "Power chugged" 48 ounces of coffee before coming in this morning.   Review of Systems No chest pain, SOB, HA, dizziness, vision change, N/V, diarrhea, constipation, dysuria, urinary urgency or frequency, myalgias, arthralgias or rash.    Past Medical History  Diagnosis Date  . Back pain, lumbosacral   . DJD (degenerative joint disease) of knee   . Carpal tunnel syndrome, bilateral   . COPD (chronic obstructive pulmonary disease) 2013    Past Surgical History  Procedure Date  . Posterior laminectomy / decompression lumbar spine 2007  . Hernia repair     right inguinal  . Tonsillectomy     Prior to Admission medications   Medication Sig Start Date End Date Taking? Authorizing Provider  albuterol (PROVENTIL HFA;VENTOLIN HFA) 108 (90 BASE) MCG/ACT inhaler Inhale 2 puffs into the lungs every 4 (four) hours as needed for wheezing (cough, shortness of breath or wheezing.). 09/29/11 09/28/12 Yes Heather M Marte, PA-C  ibuprofen (ADVIL,MOTRIN) 200 MG tablet Take 600 mg by mouth every 6 (six) hours as needed.   Yes Historical Provider, MD  loratadine (CLARITIN) 10 MG tablet Take 10 mg by mouth daily.   Yes Historical Provider, MD  Mometasone Furo-Formoterol Fum 200-5 MCG/ACT AERO Inhale 1 puff  into the lungs 2 (two) times daily. 12/21/11  Yes Ladasha Schnackenberg Tessa Lerner, PA-C    Allergies  Allergen Reactions  . Penicillins Anaphylaxis    History   Social History  . Marital Status: Married    Spouse Name: Asher Muir    Number of Children: 3  . 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     Social History Main Topics  . Smoking status: Current Every Day Smoker -- 0.8 packs/day    Types: Cigarettes  . Smokeless tobacco: Former Neurosurgeon   Comment: trying to-"you gotta have some will power.  I don't have my mind right to do it yet."  . Alcohol Use: 0.0 - 2.4 oz/week    0-4 Cans of beer per week  . Drug Use: No  . Sexually Active: Yes -- Male partner(s)   Other Topics Concern  . Not on file   Social History Narrative   Denied application for additional life insurance policy due to recent diagnosis of COPD.    Family History  Problem Relation Age of Onset  . Hearing loss Mother   . Cancer Father 56    prostate, bladder  . Heart disease Father   . Stroke Father   . Cancer Maternal Grandfather   . Cancer Paternal Grandmother   . Heart disease Paternal Grandfather   . Colon cancer Neg Hx   . Esophageal cancer Neg  Hx   . Rectal cancer Neg Hx   . Stomach cancer Neg Hx        Objective:   Physical Exam Blood pressure 154/98, pulse 79, temperature 98 F (36.7 C), resp. rate 18, height 5\' 10"  (1.778 m), weight 190 lb (86.183 kg). Body mass index is 27.26 kg/(m^2). Well-developed, well nourished WM who is awake, alert and oriented, in NAD. HEENT: [place]/AT, sclera and conjunctiva are clear.   Heart: RRR, no murmur Lungs: normal effort, CTA Extremities: no cyanosis, clubbing or edema. Skin: warm and dry without rash. Psychologic: good mood and appropriate affect, normal speech and behavior.  Office Spirometry Results: FEV1: 3.31 liters FVC: 5.38 liters FEV1/FVC: 61.5 % FVC  % Predicted: 106 liters FEV % Predicted: 84 liters FeF 25-75:  2.32 liters FeF 25-75 % Predicted: 67  Difficult to reproduce, so interpret with care-none-the-less, this is a considerable improvement from his initial diagnosis, and continued improvement from 5 weeks ago.      Assessment & Plan:   1. COPD (chronic obstructive pulmonary disease)  Improved.  Encouraged continued efforts for smoking cessation and continued medication.  2. Need for influenza vaccination  Flu vaccine greater than or equal to 3yo preservative free IM  3. Elevated BP  Recheck at next visit.  If remains elevated, will need to start medication to lower it.  Of course, smoking cessation will also help.   He declined the pneumococcal vaccine at this visit, but will re-consider in the future.

## 2012-02-01 NOTE — Patient Instructions (Signed)
Please continue your current treatment and continue your efforts to quit smoking.

## 2012-05-09 ENCOUNTER — Ambulatory Visit: Payer: BC Managed Care – PPO | Admitting: Physician Assistant

## 2012-07-19 ENCOUNTER — Encounter: Payer: Self-pay | Admitting: Gastroenterology

## 2013-03-01 ENCOUNTER — Other Ambulatory Visit: Payer: Self-pay

## 2015-03-27 DIAGNOSIS — Z9861 Coronary angioplasty status: Principal | ICD-10-CM

## 2015-03-27 DIAGNOSIS — I2510 Atherosclerotic heart disease of native coronary artery without angina pectoris: Secondary | ICD-10-CM

## 2015-03-27 HISTORY — DX: Coronary angioplasty status: Z98.61

## 2015-03-27 HISTORY — DX: Atherosclerotic heart disease of native coronary artery without angina pectoris: I25.10

## 2015-03-31 ENCOUNTER — Encounter (HOSPITAL_COMMUNITY): Payer: Self-pay | Admitting: Family Medicine

## 2015-03-31 ENCOUNTER — Encounter (HOSPITAL_COMMUNITY)
Admission: EM | Disposition: A | Discharge status: Home / Self Care | Payer: Self-pay | Source: Home / Self Care | Attending: Cardiology

## 2015-03-31 ENCOUNTER — Ambulatory Visit (HOSPITAL_COMMUNITY): Admit: 2015-03-31 | Payer: Self-pay | Admitting: Cardiology

## 2015-03-31 ENCOUNTER — Inpatient Hospital Stay (HOSPITAL_COMMUNITY)
Admission: EM | Admit: 2015-03-31 | Discharge: 2015-04-03 | DRG: 247 | Disposition: A | Discharge status: Home / Self Care | Payer: 59 | Attending: Cardiology | Admitting: Cardiology

## 2015-03-31 DIAGNOSIS — I213 ST elevation (STEMI) myocardial infarction of unspecified site: Secondary | ICD-10-CM

## 2015-03-31 DIAGNOSIS — Z880 Allergy status to penicillin: Secondary | ICD-10-CM

## 2015-03-31 DIAGNOSIS — Z8249 Family history of ischemic heart disease and other diseases of the circulatory system: Secondary | ICD-10-CM | POA: Diagnosis not present

## 2015-03-31 DIAGNOSIS — I25110 Atherosclerotic heart disease of native coronary artery with unstable angina pectoris: Secondary | ICD-10-CM

## 2015-03-31 DIAGNOSIS — J449 Chronic obstructive pulmonary disease, unspecified: Secondary | ICD-10-CM | POA: Diagnosis present

## 2015-03-31 DIAGNOSIS — I2129 ST elevation (STEMI) myocardial infarction involving other sites: Principal | ICD-10-CM | POA: Diagnosis present

## 2015-03-31 DIAGNOSIS — I10 Essential (primary) hypertension: Secondary | ICD-10-CM | POA: Diagnosis present

## 2015-03-31 DIAGNOSIS — Z79899 Other long term (current) drug therapy: Secondary | ICD-10-CM

## 2015-03-31 DIAGNOSIS — I2510 Atherosclerotic heart disease of native coronary artery without angina pectoris: Secondary | ICD-10-CM | POA: Diagnosis present

## 2015-03-31 DIAGNOSIS — I2121 ST elevation (STEMI) myocardial infarction involving left circumflex coronary artery: Secondary | ICD-10-CM

## 2015-03-31 DIAGNOSIS — Z7951 Long term (current) use of inhaled steroids: Secondary | ICD-10-CM

## 2015-03-31 DIAGNOSIS — Z823 Family history of stroke: Secondary | ICD-10-CM

## 2015-03-31 DIAGNOSIS — F17200 Nicotine dependence, unspecified, uncomplicated: Secondary | ICD-10-CM | POA: Diagnosis present

## 2015-03-31 HISTORY — PX: CARDIAC CATHETERIZATION: SHX172

## 2015-03-31 HISTORY — DX: ST elevation (STEMI) myocardial infarction of unspecified site: I21.3

## 2015-03-31 LAB — DIFFERENTIAL
BASOS ABS: 0 10*3/uL (ref 0.0–0.1)
Basophils Relative: 0 %
Eosinophils Absolute: 0.1 10*3/uL (ref 0.0–0.7)
Eosinophils Relative: 1 %
LYMPHS ABS: 1.7 10*3/uL (ref 0.7–4.0)
Lymphocytes Relative: 21 %
MONOS PCT: 10 %
Monocytes Absolute: 0.8 10*3/uL (ref 0.1–1.0)
NEUTROS ABS: 5.6 10*3/uL (ref 1.7–7.7)
NEUTROS PCT: 68 %

## 2015-03-31 LAB — CBC
HEMATOCRIT: 46.9 % (ref 39.0–52.0)
HEMOGLOBIN: 15.5 g/dL (ref 13.0–17.0)
MCH: 31 pg (ref 26.0–34.0)
MCHC: 33 g/dL (ref 30.0–36.0)
MCV: 93.8 fL (ref 78.0–100.0)
Platelets: 216 10*3/uL (ref 150–400)
RBC: 5 MIL/uL (ref 4.22–5.81)
RDW: 13.4 % (ref 11.5–15.5)
WBC: 8.3 10*3/uL (ref 4.0–10.5)

## 2015-03-31 LAB — I-STAT CHEM 8, ED
BUN: 13 mg/dL (ref 6–20)
CALCIUM ION: 1.11 mmol/L — AB (ref 1.12–1.23)
CHLORIDE: 105 mmol/L (ref 101–111)
Creatinine, Ser: 0.8 mg/dL (ref 0.61–1.24)
Glucose, Bld: 100 mg/dL — ABNORMAL HIGH (ref 65–99)
HEMATOCRIT: 51 % (ref 39.0–52.0)
Hemoglobin: 17.3 g/dL — ABNORMAL HIGH (ref 13.0–17.0)
Potassium: 3.9 mmol/L (ref 3.5–5.1)
SODIUM: 140 mmol/L (ref 135–145)
TCO2: 20 mmol/L (ref 0–100)

## 2015-03-31 LAB — BASIC METABOLIC PANEL
ANION GAP: 9 (ref 5–15)
BUN: 12 mg/dL (ref 6–20)
CALCIUM: 9 mg/dL (ref 8.9–10.3)
CO2: 20 mmol/L — AB (ref 22–32)
Chloride: 107 mmol/L (ref 101–111)
Creatinine, Ser: 0.91 mg/dL (ref 0.61–1.24)
GLUCOSE: 100 mg/dL — AB (ref 65–99)
POTASSIUM: 3.9 mmol/L (ref 3.5–5.1)
Sodium: 136 mmol/L (ref 135–145)

## 2015-03-31 LAB — MRSA PCR SCREENING: MRSA by PCR: NEGATIVE

## 2015-03-31 LAB — TSH: TSH: 1.059 u[IU]/mL (ref 0.350–4.500)

## 2015-03-31 LAB — TROPONIN I: TROPONIN I: 19.51 ng/mL — AB (ref <0.031)

## 2015-03-31 LAB — I-STAT TROPONIN, ED: Troponin i, poc: 0.01 ng/mL (ref 0.00–0.08)

## 2015-03-31 LAB — POCT ACTIVATED CLOTTING TIME: Activated Clotting Time: 435 seconds

## 2015-03-31 LAB — MAGNESIUM: Magnesium: 1.9 mg/dL (ref 1.7–2.4)

## 2015-03-31 SURGERY — LEFT HEART CATH AND CORONARY ANGIOGRAPHY
Anesthesia: LOCAL

## 2015-03-31 MED ORDER — ENOXAPARIN SODIUM 30 MG/0.3ML ~~LOC~~ SOLN: 30.0000 mg | SUBCUTANEOUS | Status: DC

## 2015-03-31 MED ORDER — ASPIRIN EC 81 MG PO TBEC: 81.0000 mg | DELAYED_RELEASE_TABLET | Freq: Every day | ORAL | Status: DC

## 2015-03-31 MED ORDER — ACETAMINOPHEN 325 MG PO TABS: 650.0000 mg | ORAL_TABLET | ORAL | Status: DC | PRN

## 2015-03-31 MED ORDER — IOHEXOL 350 MG/ML SOLN
INTRAVENOUS | Status: DC | PRN
  Administered 2015-03-31: 160 mL via INTRA_ARTERIAL

## 2015-03-31 MED ORDER — TICAGRELOR 90 MG PO TABS
ORAL_TABLET | ORAL | Status: DC | PRN
  Administered 2015-03-31: 180 mg via ORAL

## 2015-03-31 MED ORDER — HEPARIN (PORCINE) IN NACL 100-0.45 UNIT/ML-% IJ SOLN
1600.0000 [IU]/h | INTRAMUSCULAR | Status: DC
  Administered 2015-03-31: 1100 [IU]/h via INTRAVENOUS
  Administered 2015-04-01: 1600 [IU]/h via INTRAVENOUS
  Filled 2015-03-31 (×2): qty 250

## 2015-03-31 MED ORDER — NITROGLYCERIN 1 MG/10 ML FOR IR/CATH LAB
INTRA_ARTERIAL | Status: AC
  Filled 2015-03-31: qty 10

## 2015-03-31 MED ORDER — MIDAZOLAM HCL 2 MG/2ML IJ SOLN
INTRAMUSCULAR | Status: DC | PRN
  Administered 2015-03-31: 2 mg via INTRAVENOUS

## 2015-03-31 MED ORDER — MOMETASONE FURO-FORMOTEROL FUM 200-5 MCG/ACT IN AERO
1.0000 | INHALATION_SPRAY | Freq: Two times a day (BID) | RESPIRATORY_TRACT | Status: DC
  Administered 2015-03-31 – 2015-04-03 (×6): 1 via RESPIRATORY_TRACT
  Filled 2015-03-31: qty 8.8

## 2015-03-31 MED ORDER — SODIUM CHLORIDE 0.9 % IJ SOLN: 3.0000 mL | INTRAMUSCULAR | Status: DC | PRN

## 2015-03-31 MED ORDER — ASPIRIN 81 MG PO CHEW
81.0000 mg | CHEWABLE_TABLET | Freq: Every day | ORAL | Status: DC
  Administered 2015-04-01 – 2015-04-03 (×3): 81 mg via ORAL
  Filled 2015-03-31 (×3): qty 1

## 2015-03-31 MED ORDER — VERAPAMIL HCL 2.5 MG/ML IV SOLN
INTRA_ARTERIAL | Status: DC | PRN
  Administered 2015-03-31: 13:00:00 via INTRA_ARTERIAL

## 2015-03-31 MED ORDER — MIDAZOLAM HCL 2 MG/2ML IJ SOLN
INTRAMUSCULAR | Status: AC
  Filled 2015-03-31: qty 2

## 2015-03-31 MED ORDER — SODIUM CHLORIDE 0.9 % IV SOLN: 250.0000 mL | INTRAVENOUS | Status: DC | PRN

## 2015-03-31 MED ORDER — ATORVASTATIN CALCIUM 80 MG PO TABS
80.0000 mg | ORAL_TABLET | Freq: Every day | ORAL | Status: DC
  Administered 2015-03-31 – 2015-04-02 (×3): 80 mg via ORAL
  Filled 2015-03-31 (×3): qty 1

## 2015-03-31 MED ORDER — FENTANYL CITRATE (PF) 100 MCG/2ML IJ SOLN
INTRAMUSCULAR | Status: DC | PRN
  Administered 2015-03-31: 50 ug via INTRAVENOUS

## 2015-03-31 MED ORDER — ONDANSETRON HCL 4 MG/2ML IJ SOLN: 4.0000 mg | Freq: Four times a day (QID) | INTRAMUSCULAR | Status: DC | PRN

## 2015-03-31 MED ORDER — SODIUM CHLORIDE 0.9 % IV SOLN
INTRAVENOUS | Status: DC | PRN
  Administered 2015-03-31: 1000 mL via INTRAVENOUS
  Administered 2015-03-31: 250 mL

## 2015-03-31 MED ORDER — ALBUTEROL SULFATE (2.5 MG/3ML) 0.083% IN NEBU: 3.0000 mL | INHALATION_SOLUTION | RESPIRATORY_TRACT | Status: DC | PRN

## 2015-03-31 MED ORDER — LORATADINE 10 MG PO TABS: 10.0000 mg | ORAL_TABLET | Freq: Every day | ORAL | Status: DC | PRN

## 2015-03-31 MED ORDER — MORPHINE SULFATE (PF) 2 MG/ML IV SOLN: 2.0000 mg | INTRAVENOUS | Status: DC | PRN

## 2015-03-31 MED ORDER — FENTANYL CITRATE (PF) 100 MCG/2ML IJ SOLN
INTRAMUSCULAR | Status: AC
  Filled 2015-03-31: qty 2

## 2015-03-31 MED ORDER — SODIUM CHLORIDE 0.9 % WEIGHT BASED INFUSION
3.0000 mL/kg/h | INTRAVENOUS | Status: AC
  Administered 2015-03-31: 3 mL/kg/h via INTRAVENOUS

## 2015-03-31 MED ORDER — SODIUM CHLORIDE 0.9 % IV SOLN
250.0000 mg | INTRAVENOUS | Status: DC | PRN
  Administered 2015-03-31: 1.75 mg/kg/h via INTRAVENOUS

## 2015-03-31 MED ORDER — VERAPAMIL HCL 2.5 MG/ML IV SOLN
INTRAVENOUS | Status: AC
  Filled 2015-03-31: qty 2

## 2015-03-31 MED ORDER — HEPARIN SODIUM (PORCINE) 5000 UNIT/ML IJ SOLN
60.0000 [IU]/kg | Freq: Once | INTRAMUSCULAR | Status: AC
  Administered 2015-03-31: 4000 [IU] via INTRAVENOUS

## 2015-03-31 MED ORDER — CARVEDILOL 6.25 MG PO TABS
6.2500 mg | ORAL_TABLET | Freq: Two times a day (BID) | ORAL | Status: DC
  Administered 2015-03-31 – 2015-04-03 (×5): 6.25 mg via ORAL
  Filled 2015-03-31: qty 1
  Filled 2015-03-31 (×4): qty 2

## 2015-03-31 MED ORDER — LIDOCAINE HCL (PF) 1 % IJ SOLN
INTRAMUSCULAR | Status: AC
Start: 2015-03-31 — End: 2015-03-31
  Filled 2015-03-31: qty 30

## 2015-03-31 MED ORDER — HEPARIN (PORCINE) IN NACL 2-0.9 UNIT/ML-% IJ SOLN
INTRAMUSCULAR | Status: AC
  Filled 2015-03-31: qty 1000

## 2015-03-31 MED ORDER — SODIUM CHLORIDE 0.9 % IJ SOLN
3.0000 mL | Freq: Two times a day (BID) | INTRAMUSCULAR | Status: DC
  Administered 2015-04-01 – 2015-04-02 (×3): 3 mL via INTRAVENOUS

## 2015-03-31 MED ORDER — TICAGRELOR 90 MG PO TABS
90.0000 mg | ORAL_TABLET | Freq: Two times a day (BID) | ORAL | Status: DC
  Administered 2015-03-31 – 2015-04-03 (×6): 90 mg via ORAL
  Filled 2015-03-31 (×6): qty 1

## 2015-03-31 MED ORDER — ONDANSETRON HCL 4 MG/2ML IJ SOLN
4.0000 mg | Freq: Four times a day (QID) | INTRAMUSCULAR | Status: DC | PRN
Start: 2015-03-31 — End: 2015-04-03

## 2015-03-31 MED ORDER — NITROGLYCERIN 0.4 MG SL SUBL: 0.4000 mg | SUBLINGUAL_TABLET | SUBLINGUAL | Status: DC | PRN

## 2015-03-31 MED ORDER — BIVALIRUDIN 250 MG IV SOLR
INTRAVENOUS | Status: AC
  Filled 2015-03-31: qty 250

## 2015-03-31 MED ORDER — LIDOCAINE HCL (PF) 1 % IJ SOLN
INTRAMUSCULAR | Status: DC | PRN
  Administered 2015-03-31: 5 mL

## 2015-03-31 MED ORDER — ZOLPIDEM TARTRATE 5 MG PO TABS: 5.0000 mg | ORAL_TABLET | Freq: Every evening | ORAL | Status: DC | PRN

## 2015-03-31 MED ORDER — SODIUM CHLORIDE 0.9 % IJ SOLN
3.0000 mL | Freq: Two times a day (BID) | INTRAMUSCULAR | Status: DC
  Administered 2015-03-31 – 2015-04-01 (×2): 3 mL via INTRAVENOUS

## 2015-03-31 MED ORDER — BIVALIRUDIN BOLUS VIA INFUSION - CUPID
INTRAVENOUS | Status: DC | PRN
  Administered 2015-03-31: 66.375 mg via INTRAVENOUS

## 2015-03-31 MED ORDER — ASPIRIN 81 MG PO CHEW
324.0000 mg | CHEWABLE_TABLET | Freq: Once | ORAL | Status: AC
  Administered 2015-03-31: 324 mg via ORAL
  Filled 2015-03-31: qty 4

## 2015-03-31 MED ORDER — ALPRAZOLAM 0.25 MG PO TABS: 0.2500 mg | ORAL_TABLET | Freq: Two times a day (BID) | ORAL | Status: DC | PRN

## 2015-03-31 MED ORDER — TICAGRELOR 90 MG PO TABS
ORAL_TABLET | ORAL | Status: AC
  Filled 2015-03-31: qty 2

## 2015-03-31 MED ORDER — HEPARIN (PORCINE) IN NACL 2-0.9 UNIT/ML-% IJ SOLN
INTRAMUSCULAR | Status: DC | PRN
  Administered 2015-03-31: 14:00:00

## 2015-03-31 SURGICAL SUPPLY — 17 items
BALLN EUPHORA RX 2.5X15 (BALLOONS) ×2
BALLN ~~LOC~~ EMERGE MR 3.0X8 (BALLOONS) ×2
BALLOON EUPHORA RX 2.5X15 (BALLOONS) IMPLANT
BALLOON ~~LOC~~ EMERGE MR 3.0X8 (BALLOONS) IMPLANT
CATH INFINITI JR4 5F (CATHETERS) ×1 IMPLANT
CATH OPTITORQUE TIG 4.0 5F (CATHETERS) ×2 IMPLANT
CATH VISTA GUIDE 6FR XBLAD3.5 (CATHETERS) ×1 IMPLANT
DEVICE RAD COMP TR BAND LRG (VASCULAR PRODUCTS) ×2 IMPLANT
GLIDESHEATH SLEND A-KIT 6F 22G (SHEATH) ×2 IMPLANT
KIT ENCORE 26 ADVANTAGE (KITS) ×1 IMPLANT
KIT HEART LEFT (KITS) ×2 IMPLANT
PACK CARDIAC CATHETERIZATION (CUSTOM PROCEDURE TRAY) ×2 IMPLANT
STENT SYNERGY DES 2.75X16 (Permanent Stent) ×1 IMPLANT
TRANSDUCER W/STOPCOCK (MISCELLANEOUS) ×2 IMPLANT
TUBING CIL FLEX 10 FLL-RA (TUBING) ×2 IMPLANT
WIRE ASAHI PROWATER 180CM (WIRE) ×1 IMPLANT
WIRE SAFE-T 1.5MM-J .035X260CM (WIRE) ×2 IMPLANT

## 2015-03-31 NOTE — Progress Notes (Signed)
ANTICOAGULATION CONSULT NOTE - Initial Consult  Pharmacy Consult for heparin Indication: chest pain/ACS  Allergies  Allergen Reactions  . Penicillins Anaphylaxis    Patient Measurements: Height: 5\' 10"  (177.8 cm) Weight: 180 lb 12.4 oz (82 kg) IBW/kg (Calculated) : 73 Heparin Dosing Weight: 82kg  Vital Signs: Temp: 98.5 F (36.9 C) (12/05 1250) Temp Source: Oral (12/05 1250) BP: 114/82 mmHg (12/05 1432) Pulse Rate: 77 (12/05 1432)  Labs:  Recent Labs  03/31/15 1245 03/31/15 1253  HGB 15.5 17.3*  HCT 46.9 51.0  PLT 216  --   CREATININE 0.91 0.80    Estimated Creatinine Clearance: 110.3 mL/min (by C-G formula based on Cr of 0.8).   Medical History: Past Medical History  Diagnosis Date  . Back pain, lumbosacral   . DJD (degenerative joint disease) of knee   . Carpal tunnel syndrome, bilateral   . COPD (chronic obstructive pulmonary disease) ([place]) 2013    Medications:  OTCs only  Assessment: 54 year old male with history of tobacco use but no prior cardiac history (strong family history of CAD). Patient began to have chest pain at work earlier today and was taken to [facility] for emergency cath. 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 DES placed OM, will plan on staged pci to RCA on Wednesday. Heparin to start tonight.  Goal of Therapy:  Heparin level 0.3-0.7 units/ml Monitor platelets by anticoagulation protocol: Yes   Plan:  Start heparin at 1100 units/hr>>9pm tonight Check 6 hour HL then daily with CBC  Erin Hearing PharmD., BCPS Clinical Pharmacist Pager [phone] 03/31/2015 3:08 PM

## 2015-03-31 NOTE — H&P (Signed)
History and Physical   Patient ID: WILBOR SCHMIESING MRN: [id number], DOB/AGE: 12-13-1960 54 y.o. Date of Encounter: 03/31/2015  Primary Physician: No primary care provider on file. Primary Cardiologist: New  Chief Complaint:  STEMI  HPI: Gordon Hayes is a 54 y.o. male with a history of tobacco use and premature coronary artery disease in his father and grandfather. He was without medical care until her recent visit to clinic. He had not been taking any medications. He denies any history of hypertension, hyperlipidemia or diabetes, but has not been screened recently for chronic diseases.  Today he was at work, and developed sudden onset of substernal chest pain at about 12:15 PM. It was an 11/10. It was associated with shortness of breath, diaphoresis, and nausea. He thought he was going to vomit but did not. He did not take any medications for this pain. He has never had before. He drove himself to the emergency room, and his ECG was consistent with a lateral STEMI. Code STEMI was called and he was taken emergently to the Cath Lab. He was given aspirin 324 mg and a heparin bolus. He was placed on oxygen. Upon arrival to the Cath Lab, his pain is decreased to an 8/10.  Past Medical History  Diagnosis Date  . Back pain, lumbosacral   . DJD (degenerative joint disease) of knee   . Carpal tunnel syndrome, bilateral   . COPD (chronic obstructive pulmonary disease) ([place]) 2013    Surgical History:  Past Surgical History  Procedure Laterality Date  . Posterior laminectomy / decompression lumbar spine  2007  . Hernia repair      right inguinal  . Tonsillectomy       I have reviewed the patient's current medications. Prior to Admission medications   Medication Sig Start Date End Date Taking? Authorizing Provider  albuterol (PROVENTIL HFA;VENTOLIN HFA) 108 (90 BASE) MCG/ACT inhaler Inhale 2 puffs into the lungs every 4 (four) hours as needed for wheezing (cough, shortness of breath  or wheezing.). 09/29/11 09/28/12  Collene Leyden, PA-C  ibuprofen (ADVIL,MOTRIN) 200 MG tablet Take 600 mg by mouth every 6 (six) hours as needed.    Historical Provider, MD  loratadine (CLARITIN) 10 MG tablet Take 10 mg by mouth daily.    Historical Provider, MD  Mometasone Furo-Formoterol Fum 200-5 MCG/ACT AERO Inhale 1 puff into the lungs 2 (two) times daily. 12/21/11   Harrison Mons, PA-C   Scheduled Meds:  Continuous Infusions: . sodium chloride 1,000 mL (03/31/15 1312)   PRN Meds:.sodium chloride, fentaNYL, lidocaine (PF), midazolam, Radial Cocktail (customizable)  Allergies:  Allergies  Allergen Reactions  . Penicillins Anaphylaxis   Social History   Social History  . Marital Status: Married    Spouse Name: [name]  . Number of Children: 3  . Years of Education: 12   Occupational History  . [employer] History Main Topics  . Smoking status: Current Every Day Smoker -- 0.80 packs/day    Types: Cigarettes  . Smokeless tobacco: Former Systems developer     Comment: trying to-"you gotta have some will power.  I don't have my mind right to do it yet."  . Alcohol Use: 0.0 - 2.4 oz/week    0-4 Cans of beer per week  . Drug Use: No  . Sexual Activity:    Partners: Female   Other Topics Concern  . Not on file   Social History Narrative  Denied application for additional life insurance policy due to recent diagnosis of COPD.    Family History  Problem Relation Age of Onset  . Hearing loss Mother   . Cancer Father 49    prostate, bladder  . Heart disease Father   . Stroke Father   . Cancer Maternal Grandfather   . Cancer Paternal Grandmother   . Heart disease Paternal Grandfather   . Colon cancer Neg Hx   . Esophageal cancer Neg Hx   . Rectal cancer Neg Hx   . Stomach cancer Neg Hx   . Heart attack Father     2   Family Status  Relation Status Death Age  . Mother Alive   . Father Deceased 49    staph infection  . Maternal Grandfather Deceased       prostate cancer  . Paternal Grandmother Deceased     cancer ?Lymphoma  . Paternal Grandfather Deceased   . Brother Alive   . Maternal Grandmother Deceased 39  . Brother Alive   . Daughter Alive   . Daughter Alive   . Daughter Alive     Review of Systems:   Full 14-point review of systems otherwise negative except as noted above.  Physical Exam: Blood pressure 150/107, pulse 79, temperature 98.5 F (36.9 C), temperature source Oral, resp. rate 18, height 5\' 10"  (1.778 m), weight 195 lb (88.451 kg), SpO2 97 %. General: Well developed, well nourished,male in acute distress. Head: Normocephalic, atraumatic, sclera non-icteric, no xanthomas, nares are without discharge. Dentition:  Neck: No carotid bruits. JVD not elevated. No thyromegally Lungs: Good expansion bilaterally. without wheezes or rhonchi.  Heart: Regular rate and rhythm with S1 S2.  No S3 or S4.  No murmur, no rubs, or gallops appreciated. Abdomen: Soft, non-tender, non-distended with normoactive bowel sounds. No hepatomegaly. No rebound/guarding. No obvious abdominal masses. Msk:  Strength and tone appear normal for age. No joint deformities or effusions, no spine or costo-vertebral angle tenderness. Extremities: No clubbing or cyanosis. No edema.  Distal pedal pulses are 2+ in 4 extrem Neuro: Alert and oriented X 3. Moves all extremities spontaneously. No focal deficits noted. Psych:  Responds to questions appropriately with a normal affect. Skin: No rashes or lesions noted  Labs:   Lab Results  Component Value Date   WBC 5.3 11/23/2011   HGB 17.3* 03/31/2015   HCT 51.0 03/31/2015   MCV 88.9 11/23/2011   PLT 270 11/23/2011     Recent Labs Lab 03/31/15 1253  NA 140  K 3.9  CL 105  BUN 13  CREATININE 0.80  GLUCOSE 100*    Recent Labs  03/31/15 1251  TROPIPOC 0.01    ECG: sinus rhythm, rate 80, lateral ST elevation in V5 and V6 with reciprocal changes in V 1-3  ASSESSMENT AND PLAN:  Principal  Problem:   Acute ST elevation myocardial infarction (STEMI) of lateral wall (HCC) - patient has had aspirin. He is being taken emergently to the cath lab with further evaluation and treatment depending on the results. He will be screened for cardiac risk factors and educated on cardiac risk factor reduction, including smoking cessation.  Active Problems:   Essential hypertension - patient is not on any medications for this and denies history. Continue to follow    COPD - Pt not currently on any inhalers, but has 2 on previous med lists - follow for symptoms, hold off on ordering anything at this time.  Signed, Rosaria Ferries, PA-C 03/31/2015 1:20  PM Beeper [phone]   I have seen, examined and evaluated the patient this PM along with Ms. Benita Gutter, PA-C.  After reviewing all the available data and chart,  I agree with her findings, examination as well as impression recommendations.  Presented with ACS- Acute Lateral STE on V5 & V6 severe 8-10/10 pain.  Plan is to proceed to cardiac catheterization lab. Final plans per Note.   Leonie Man, M.D., M.S. Interventional Cardiologist   Pager # [phone]

## 2015-03-31 NOTE — ED Notes (Signed)
Pt here for chest pain that started about 30 minutes ago. sts left chest pain with bilateral arm numbness. sts he was sitting there when it started.

## 2015-03-31 NOTE — ED Notes (Signed)
Patient undressed, in gown, on monitor, placed on zoll pads, continuous pulse oximetry and blood pressure cuff

## 2015-03-31 NOTE — Care Management Note (Signed)
Case Management Note  Patient Details  Name: Gordon Hayes MRN: [id number] Date of Birth: 1960/08/18  Subjective/Objective:        Adm w mi         Action/Plan: lives w wife  Expected Discharge Date:                  Expected Discharge Plan:  Home/Self Care  In-House Referral:     Discharge planning Services  CM Consult, Medication Assistance  Post Acute Care Choice:    Choice offered to:     DME Arranged:    DME Agency:     HH Arranged:    [place] Agency:     Status of Service:     Medicare Important Message Given:    Date Medicare IM Given:    Medicare IM give by:    Date Additional Medicare IM Given:    Additional Medicare Important Message give by:     If discussed at [place] of Stay Meetings, dates discussed:    Additional Comments: ur review done, gave pt 30day free and copay assist card for brilinta. Pt has [facility] ins thru 12-31 then changes to [facility] ins.  Lacretia Leigh, RN 03/31/2015, 2:52 PM

## 2015-03-31 NOTE — ED Provider Notes (Signed)
CSN: [id number]     Arrival date & time 03/31/15  1233 History   First MD Initiated Contact with Patient 03/31/15 1302     Chief Complaint  Patient presents with  . Code STEMI     HPI  Patient presents after sudden onset of chest pain. Since onset has been persistent, anterior, left-sided, nonradiating. There is associated generalized discomfort, weakness, minimal dyspnea. Patient denies prior similar events, acknowledges a history of COPD, denies history of coronary disease.   Past Medical History  Diagnosis Date  . Back pain, lumbosacral   . DJD (degenerative joint disease) of knee   . Carpal tunnel syndrome, bilateral   . COPD (chronic obstructive pulmonary disease) ([place]) 2013   Past Surgical History  Procedure Laterality Date  . Posterior laminectomy / decompression lumbar spine  2007  . Hernia repair      right inguinal  . Tonsillectomy     Family History  Problem Relation Age of Onset  . Hearing loss Mother   . Cancer Father 67    prostate, bladder  . Heart disease Father   . Stroke Father   . Cancer Maternal Grandfather   . Cancer Paternal Grandmother   . Heart disease Paternal Grandfather   . Colon cancer Neg Hx   . Esophageal cancer Neg Hx   . Rectal cancer Neg Hx   . Stomach cancer Neg Hx   . Heart attack Father     2   Social History  Substance Use Topics  . Smoking status: Current Every Day Smoker -- 0.80 packs/day    Types: Cigarettes  . Smokeless tobacco: Former Systems developer     Comment: trying to-"you gotta have some will power.  I don't have my mind right to do it yet."  . Alcohol Use: 0.0 - 2.4 oz/week    0-4 Cans of beer per week    Review of Systems  Constitutional:       Per HPI, otherwise negative  HENT:       Per HPI, otherwise negative  Respiratory:       Per HPI, otherwise negative  Cardiovascular:       Per HPI, otherwise negative  Gastrointestinal: Negative for vomiting.  Endocrine:       Negative aside from HPI  Genitourinary:       Neg aside from HPI   Musculoskeletal:       Per HPI, otherwise negative  Skin: Negative.   Neurological: Negative for syncope.      Allergies  Penicillins  Home Medications   Prior to Admission medications   Medication Sig Start Date End Date Taking? Authorizing Provider  albuterol (PROVENTIL HFA;VENTOLIN HFA) 108 (90 BASE) MCG/ACT inhaler Inhale 2 puffs into the lungs every 4 (four) hours as needed for wheezing (cough, shortness of breath or wheezing.). 09/29/11 09/28/12  Collene Leyden, PA-C  ibuprofen (ADVIL,MOTRIN) 200 MG tablet Take 600 mg by mouth every 6 (six) hours as needed.    Historical Provider, MD  loratadine (CLARITIN) 10 MG tablet Take 10 mg by mouth daily.    Historical Provider, MD  Mometasone Furo-Formoterol Fum 200-5 MCG/ACT AERO Inhale 1 puff into the lungs 2 (two) times daily. 12/21/11   Chelle Jeffery, PA-C   BP 150/107 mmHg  Pulse 79  Temp(Src) 98.5 F (36.9 C) (Oral)  Resp 18  Ht 5\' 10"  (1.778 m)  Wt 195 lb (88.451 kg)  BMI 27.98 kg/m2  SpO2 97% Physical Exam  Constitutional: He is oriented to  person, place, and time. He appears well-developed.  Uncomfortable appearing M, diaphoretic  HENT:  Head: Normocephalic and atraumatic.  Eyes: Conjunctivae and EOM are normal.  Cardiovascular: Regular rhythm.  Tachycardia present.   Pulmonary/Chest: Effort normal. No stridor. No respiratory distress.  Abdominal: He exhibits no distension.  Musculoskeletal: He exhibits no edema.  Neurological: He is alert and oriented to person, place, and time.  Skin: Skin is warm. He is diaphoretic.  Psychiatric: He has a normal mood and affect.  Nursing note and vitals reviewed.  With EKG changes concerning for ST elevation MI, case was conducted with our cardiology colleagues. ED Course  Procedures (including critical care time) Labs Review Labs Reviewed  I-STAT CHEM 8, ED - Abnormal; Notable for the following:    Glucose, Bld 100 (*)    Calcium, Ion 1.11 (*)     Hemoglobin 17.3 (*)    All other components within normal limits  CBC  DIFFERENTIAL  PROTIME-INR  APTT  BASIC METABOLIC PANEL  I-STAT TROPOININ, ED       EKG Interpretation   Date/Time:  Monday March 31 2015 12:40:25 EST Ventricular Rate:  80 PR Interval:  122 QRS Duration: 98 QT Interval:  370 QTC Calculation: 426 R Axis:   86 Text Interpretation:  Critical Test Result: STEMI Normal sinus rhythm  Incomplete right bundle branch block ST elevation consider inferolateral  injury or acute infarct  ACUTE MI / STEMI Abnormal EC ACUTE MI / STEMI Sinus rhythm Abnormal ekg Confirmed by Carmin Muskrat  MD [phone]) on 03/31/2015 1:42:55 PM     After the initial valuation discussed patient's case with our cath lab colleagues. Patient was started on heparin, transferred to the catheterization lab for definitive care. MDM  No history of coronary artery disease, but active smoker now presents with new chest pain. Given the patient's new chest pain, EKG changes, there is concern for ongoing ischemia. This was discussed with our cardiology team, soon after my evaluation, and the patient was transferred to the catheterization lab for additional intervention.  On review of cath results:  Severe 2 vessel disease involving OM 2 and mid RCA. 2nd Mrg lesion, 99% stenosed. PCI performed with synergy DES 2.75 mm x 16 mm (3.2 mm). Post intervention, there is a 0% residual stenosis. Lat 2nd Mrg lesion, 80% stenosed. Mid RCA lesion, 85% stenosed. There is mild left ventricular systolic dysfunction.   CRITICAL CARE Performed by: Carmin Muskrat Total critical care time: 35 minutes Critical care time was exclusive of separately billable procedures and treating other patients. Critical care was necessary to treat or prevent imminent or life-threatening deterioration. Critical care was time spent personally by me on the following activities: development of treatment plan with patient and/or  surrogate as well as nursing, discussions with consultants, evaluation of patient's response to treatment, examination of patient, obtaining history from patient or surrogate, ordering and performing treatments and interventions, ordering and review of laboratory studies, ordering and review of radiographic studies, pulse oximetry and re-evaluation of patient's condition.   Carmin Muskrat, MD 03/31/15 [phone]

## 2015-03-31 NOTE — Progress Notes (Addendum)
CRITICAL VALUE ALERT  Critical value received:  Troponin 19.51  Date of notification:  03/31/15  Time of notification:  [id number]  Critical value read back:Yes.    Nurse who received alert:  Newman Nickels RN Expected value

## 2015-04-01 ENCOUNTER — Inpatient Hospital Stay (HOSPITAL_COMMUNITY): Payer: 59

## 2015-04-01 LAB — COMPREHENSIVE METABOLIC PANEL
ALBUMIN: 3.3 g/dL — AB (ref 3.5–5.0)
ALT: 45 U/L (ref 17–63)
ANION GAP: 7 (ref 5–15)
AST: 159 U/L — ABNORMAL HIGH (ref 15–41)
Alkaline Phosphatase: 53 U/L (ref 38–126)
BILIRUBIN TOTAL: 0.6 mg/dL (ref 0.3–1.2)
BUN: 10 mg/dL (ref 6–20)
CO2: 24 mmol/L (ref 22–32)
Calcium: 8.8 mg/dL — ABNORMAL LOW (ref 8.9–10.3)
Chloride: 109 mmol/L (ref 101–111)
Creatinine, Ser: 0.86 mg/dL (ref 0.61–1.24)
GFR calc Af Amer: >60 mL/min (ref >60)
GLUCOSE: 107 mg/dL — AB (ref 65–99)
POTASSIUM: 3.9 mmol/L (ref 3.5–5.1)
Sodium: 140 mmol/L (ref 135–145)
TOTAL PROTEIN: 6.2 g/dL — AB (ref 6.5–8.1)

## 2015-04-01 LAB — LIPID PANEL
Cholesterol: 140 mg/dL (ref 0–200)
HDL: 46 mg/dL (ref >40)
LDL CALC: 80 mg/dL (ref 0–99)
TRIGLYCERIDES: 69 mg/dL (ref <150)
Total CHOL/HDL Ratio: 3 RATIO
VLDL: 14 mg/dL (ref 0–40)

## 2015-04-01 LAB — CBC
HEMATOCRIT: 45 % (ref 39.0–52.0)
HEMOGLOBIN: 15 g/dL (ref 13.0–17.0)
MCH: 30.9 pg (ref 26.0–34.0)
MCHC: 33.3 g/dL (ref 30.0–36.0)
MCV: 92.8 fL (ref 78.0–100.0)
Platelets: 204 10*3/uL (ref 150–400)
RBC: 4.85 MIL/uL (ref 4.22–5.81)
RDW: 13.4 % (ref 11.5–15.5)
WBC: 8.9 10*3/uL (ref 4.0–10.5)

## 2015-04-01 LAB — HEPARIN LEVEL (UNFRACTIONATED)
Heparin Unfractionated: 0.11 IU/mL — ABNORMAL LOW (ref 0.30–0.70)
Heparin Unfractionated: 0.24 IU/mL — ABNORMAL LOW (ref 0.30–0.70)
Heparin Unfractionated: 0.31 IU/mL (ref 0.30–0.70)

## 2015-04-01 LAB — TROPONIN I: Troponin I: >65 ng/mL (ref <0.031)

## 2015-04-01 LAB — HEMOGLOBIN A1C
HEMOGLOBIN A1C: 5.9 % — AB (ref 4.8–5.6)
MEAN PLASMA GLUCOSE: 123 mg/dL

## 2015-04-01 MED ORDER — HEPARIN (PORCINE) IN NACL 100-0.45 UNIT/ML-% IJ SOLN
1650.0000 [IU]/h | INTRAMUSCULAR | Status: DC
  Administered 2015-04-02: 1650 [IU]/h via INTRAVENOUS
  Filled 2015-04-01: qty 250

## 2015-04-01 NOTE — Progress Notes (Signed)
Patient Name: Gordon Hayes Date of Encounter: 04/01/2015  Principal Problem:   Acute ST elevation myocardial infarction (STEMI) of lateral wall [facility]) Active Problems:   Essential hypertension   Atherosclerotic heart disease of native coronary artery with unstable angina pectoris ([place])   STEMI (ST elevation myocardial infarction) [facility])   Primary Cardiologist: Dr Ellyn Hack  Patient Profile: 54 yo male w/ tob use, FH CAD, admitted 12/05 w/ STEMI, had PCI OM2, staged PCI RCA 12/07  SUBJECTIVE: No chest pain or SOB, did well w/ cardiac rehab  OBJECTIVE Filed Vitals:   04/01/15 0600 04/01/15 0716 04/01/15 0740 04/01/15 0800  BP: 99/68 112/80  112/92  Pulse: 66 73  74  Temp:  98.2 F (36.8 C)    TempSrc:  Oral    Resp:  18    Height:      Weight: 179 lb 14.3 oz (81.6 kg)     SpO2: 93% 96% 96% 95%    Intake/Output Summary (Last 24 hours) at 04/01/15 1006 Last data filed at 04/01/15 0800  Gross per 24 hour  Intake 1032.05 ml  Output   2600 ml  Net -1567.95 ml   Filed Weights   03/31/15 1311 03/31/15 1432 04/01/15 0600  Weight: 195 lb (88.451 kg) 180 lb 12.4 oz (82 kg) 179 lb 14.3 oz (81.6 kg)    PHYSICAL EXAM General: Well developed, well nourished, male in no acute distress. Head: Normocephalic, atraumatic.  Neck: Supple without bruits, JVD not elevated. Lungs:  Resp regular and unlabored, CTA. Heart: RRR, S1, S2, no S3, S4, or murmur; no rub. Abdomen: Soft, non-tender, non-distended, BS + x 4.  Extremities: No clubbing, cyanosis, edema. R radial cath site without ecchymosis or hematoma  Neuro: Alert and oriented X 3. Moves all extremities spontaneously. Psych: Normal affect.  LABS: CBC: Recent Labs  03/31/15 1245 03/31/15 1253 04/01/15 0250  WBC 8.3  --  8.9  NEUTROABS 5.6  --   --   HGB 15.5 17.3* 15.0  HCT 46.9 51.0 45.0  MCV 93.8  --  92.8  PLT 216  --  0000000   Basic Metabolic Panel: Recent Labs  03/31/15 1245 03/31/15 1253 03/31/15 1541  04/01/15 0250  NA 136 140  --  140  K 3.9 3.9  --  3.9  CL 107 105  --  109  CO2 20*  --   --  24  GLUCOSE 100* 100*  --  107*  BUN 12 13  --  10  CREATININE 0.91 0.80  --  0.86  CALCIUM 9.0  --   --  8.8*  MG  --   --  1.9  --    Liver Function Tests: Recent Labs  04/01/15 0250  AST 159*  ALT 45  ALKPHOS 53  BILITOT 0.6  PROT 6.2*  ALBUMIN 3.3*   Cardiac Enzymes: Recent Labs  03/31/15 1541 03/31/15 2026 04/01/15 0250  TROPONINI 19.51* >65.00* >65.00*    Recent Labs  03/31/15 1251  TROPIPOC 0.01   Fasting Lipid Panel: Recent Labs  04/01/15 0250  CHOL 140  HDL 46  LDLCALC 80  TRIG 69  CHOLHDL 3.0   Thyroid Function Tests: Recent Labs  03/31/15 1541  TSH 1.059   TELE:  SR, PVCs & 3 bt runs VT      ECG: 12/05 SR, short PR, previous ischemic changes resolved Vent. rate 69 BPM PR interval 108 ms QRS duration 96 ms QT/QTc 446/477 ms P-R-T axes 4 -41 235  Cath: 12/05 1. Severe 2 vessel disease involving OM 2 and mid RCA. 2. 2nd Mrg lesion, 99% stenosed. PCI performed with synergy DES 2.75 mm x 16 mm (3.2 mm). Post intervention, there is a 0% residual stenosis. 3. Lat 2nd Mrg lesion, 80% stenosed. 4. Mid RCA lesion, 85% stenosed. 5. There is mild left ventricular systolic dysfunction. Successful PCI of what appears to be the culprit lesion based on patient having resolution of his anginal symptoms and improved EKG findings. He does still have residual. Lesion in the mid RCA. The inferior branch of OM 2 does have an ostial lesion, but this was felt to be too small and too ostial branch vessel for PCI/PTCA. Will treat medically. Plan:  Admit to CCU for standard post radial cath care.  Restart heparin 6 hours post TR band removal  Anticipate staged PCI of severe flow-limiting lesion of mid RCA on Wednesday.  Dual antiplatelet therapy for minimum one year (would be okay potentially to stop aspirin after 3 months for bleeding issues) Expected  discharge on the following days PCI.  Radiology/Studies: No results found.   Current Medications:  . aspirin  81 mg Oral Daily  . atorvastatin  80 mg Oral q1800  . carvedilol  6.25 mg Oral BID WC  . mometasone-formoterol  1 puff Inhalation BID  . sodium chloride  3 mL Intravenous Q12H  . sodium chloride  3 mL Intravenous Q12H  . ticagrelor  90 mg Oral BID   . heparin 1,400 Units/hr (04/01/15 0334)    ASSESSMENT AND PLAN: Principal Problem:   Acute ST elevation myocardial infarction (STEMI) of lateral wall (HCC) - on ASA, BB, statin - no ACE w/ SBP 90s at times - PCI RCA 12/07 - cardiac rehab seeing  Active Problems:   Essential hypertension - good control on current rx    Atherosclerotic heart disease of native coronary artery with unstable angina pectoris ([place]) - see above     Tobacco use - cessation encouraged  Plan - tx stepdown, PCI in am, possible d/c 12/08  Signed, Rosaria Ferries , PA-C 10:06 AM 04/01/2015 Patient seen and examined. I agree with the assessment and plan as detailed above. See also my additional thoughts below.   Patient is stable. The plan is for staged PCI. He had PCI with acute event yesterday. Schedule for PCI tomorrow on April 02, 2015.  Dola Argyle, MD, [facility] 04/01/2015 11:34 AM

## 2015-04-01 NOTE — Progress Notes (Addendum)
ANTICOAGULATION CONSULT NOTE - Follow-up Consult  Pharmacy Consult for heparin Indication: chest pain/ACS  Allergies  Allergen Reactions  . Penicillins Anaphylaxis    Patient Measurements: Height: 5\' 10"  (177.8 cm) Weight: 179 lb 14.3 oz (81.6 kg) IBW/kg (Calculated) : 73 Heparin Dosing Weight: 82kg  Vital Signs: Temp: 98.9 F (37.2 C) (12/06 1139) Temp Source: Oral (12/06 1139) BP: 125/75 mmHg (12/06 1140) Pulse Rate: 78 (12/06 1139)  Labs:  Recent Labs  03/31/15 1245 03/31/15 1253 03/31/15 1541 03/31/15 2026 04/01/15 0250 04/01/15 0930  HGB 15.5 17.3*  --   --  15.0  --   HCT 46.9 51.0  --   --  45.0  --   PLT 216  --   --   --  204  --   HEPARINUNFRC  --   --   --   --  0.11* 0.24*  CREATININE 0.91 0.80  --   --  0.86  --   TROPONINI  --   --  19.51* >65.00* >65.00*  --     Estimated Creatinine Clearance: 102.6 mL/min (by C-G formula based on Cr of 0.86).  Assessment: 54 year old male on heparin for severe CAD. S/p cath with DES placed OM, will plan on staged pci to RCA on Wednesday.  Heparin level 0.24 (subtherapeutic) on 1400 units/hr. CBC stable. No issues with line or bleeding reported per RN.  Goal of Therapy:  Heparin level 0.3-0.7 units/ml Monitor platelets by anticoagulation protocol: Yes   Plan:  Increase heparin to 1600 units/hr Check 6 hour HL  Erin Hearing PharmD., BCPS Clinical Pharmacist Pager [phone] 04/01/2015 11:44 AM   ===========================   Addendum: - HL 0.31, therapeutic - no bleeding reported   Plan: - Increase heparin gtt to 1650 units/hr - F/U AM labs    Azrael Maddix D. Mina Marble, PharmD, BCPS Pager:  [phone] 04/01/2015, 8:04 PM

## 2015-04-01 NOTE — Research (Signed)
Presented [employer] Research Study to patient and family. Pt reviewing ICF, questions encouraged and answered. Will follow up 04/02/15.

## 2015-04-01 NOTE — Progress Notes (Signed)
ANTICOAGULATION CONSULT NOTE - Follow-up Consult  Pharmacy Consult for heparin Indication: chest pain/ACS  Allergies  Allergen Reactions  . Penicillins Anaphylaxis    Patient Measurements: Height: 5\' 10"  (177.8 cm) Weight: 180 lb 12.4 oz (82 kg) IBW/kg (Calculated) : 73 Heparin Dosing Weight: 82kg  Vital Signs: Temp: 98.2 F (36.8 C) (12/05 1640) Temp Source: Oral (12/05 1640) BP: 120/80 mmHg (12/05 2200) Pulse Rate: 68 (12/06 0000)  Labs:  Recent Labs  03/31/15 1245 03/31/15 1253 03/31/15 1541 03/31/15 2026 04/01/15 0250  HGB 15.5 17.3*  --   --  15.0  HCT 46.9 51.0  --   --  45.0  PLT 216  --   --   --  204  HEPARINUNFRC  --   --   --   --  0.11*  CREATININE 0.91 0.80  --   --   --   TROPONINI  --   --  19.51* >65.00*  --     Estimated Creatinine Clearance: 110.3 mL/min (by C-G formula based on Cr of 0.8).  Assessment: 54 year old male on heparin for severe CAD. S/p cath with DES placed OM, will plan on staged pci to RCA on Wednesday. Heparin level 0.11 (subtherapeutic) on 1100 units/hr. CBC stable. No issues with line or bleeding reported per RN.  Goal of Therapy:  Heparin level 0.3-0.7 units/ml Monitor platelets by anticoagulation protocol: Yes   Plan:  Increase heparin to 1400 units/hr Check 6 hour HL  Sherlon Handing, PharmD, BCPS Clinical pharmacist, pager [phone] 04/01/2015 3:30 AM

## 2015-04-01 NOTE — Progress Notes (Signed)
CARDIAC REHAB PHASE I   PRE:  Rate/Rhythm: 105 ST  BP:  Sitting: 115/82        SaO2: 95 RA  MODE:  Ambulation: 700 ft   POST:  Rate/Rhythm: 83 SR  BP:  Sitting: 118/88         SaO2: 97 RA  Pt ambulated  700 ft on RA, IV, handheld assist, steady gait, tolerated well.  Pt denies cp, dizziness, DOE, declined rest stop. Began MI/stent education with pt and mother at bedside.  Reviewed risk factors, tobacco cessation (gave pt fake cigarette), anti-platelet therapy, stent card, activity restrictions, ntg, exercise, and  phase 2 cardiac rehab. Gave pt heart healthy diet handout to review. Pt verbalized understanding, receptive to education, states he knows he has to quit smoking. Pt agrees to phase 2 cardiac rehab referral, will send to [facility]. Pt to recliner after walk, call bell within reach. Pt for staged PCI tomorrow, will follow and finish diet education.  WW:[phone]  Lenna Sciara, RN, BSN 04/01/2015 10:41 AM

## 2015-04-02 ENCOUNTER — Encounter (HOSPITAL_COMMUNITY)
Admission: EM | Disposition: A | Discharge status: Home / Self Care | Payer: Self-pay | Source: Home / Self Care | Attending: Cardiology

## 2015-04-02 DIAGNOSIS — I2129 ST elevation (STEMI) myocardial infarction involving other sites: Principal | ICD-10-CM

## 2015-04-02 HISTORY — PX: CARDIAC CATHETERIZATION: SHX172

## 2015-04-02 LAB — PROTIME-INR
INR: 1.06 (ref 0.00–1.49)
PROTHROMBIN TIME: 14 s (ref 11.6–15.2)

## 2015-04-02 LAB — CBC
HEMATOCRIT: 42.4 % (ref 39.0–52.0)
Hemoglobin: 14.3 g/dL (ref 13.0–17.0)
MCH: 31.1 pg (ref 26.0–34.0)
MCHC: 33.7 g/dL (ref 30.0–36.0)
MCV: 92.2 fL (ref 78.0–100.0)
PLATELETS: 193 10*3/uL (ref 150–400)
RBC: 4.6 MIL/uL (ref 4.22–5.81)
RDW: 13.6 % (ref 11.5–15.5)
WBC: 9.8 10*3/uL (ref 4.0–10.5)

## 2015-04-02 LAB — HEPARIN LEVEL (UNFRACTIONATED): Heparin Unfractionated: 0.53 IU/mL (ref 0.30–0.70)

## 2015-04-02 LAB — POCT ACTIVATED CLOTTING TIME: ACTIVATED CLOTTING TIME: 513 s

## 2015-04-02 SURGERY — CORONARY STENT INTERVENTION
Anesthesia: LOCAL

## 2015-04-02 MED ORDER — SODIUM CHLORIDE 0.9 % IV SOLN
INTRAVENOUS | Status: AC
  Administered 2015-04-02: 18:00:00 via INTRAVENOUS

## 2015-04-02 MED ORDER — ONDANSETRON HCL 4 MG/2ML IJ SOLN
INTRAMUSCULAR | Status: AC
  Filled 2015-04-02: qty 2

## 2015-04-02 MED ORDER — HEPARIN (PORCINE) IN NACL 2-0.9 UNIT/ML-% IJ SOLN
INTRAMUSCULAR | Status: AC
  Filled 2015-04-02: qty 1000

## 2015-04-02 MED ORDER — ONDANSETRON HCL 4 MG/2ML IJ SOLN
INTRAMUSCULAR | Status: DC | PRN
  Administered 2015-04-02: 4 mg via INTRAVENOUS

## 2015-04-02 MED ORDER — IOHEXOL 350 MG/ML SOLN
INTRAVENOUS | Status: DC | PRN
  Administered 2015-04-02: 75 mL via INTRA_ARTERIAL

## 2015-04-02 MED ORDER — MIDAZOLAM HCL 2 MG/2ML IJ SOLN
INTRAMUSCULAR | Status: DC | PRN
  Administered 2015-04-02: 1 mg via INTRAVENOUS

## 2015-04-02 MED ORDER — SODIUM CHLORIDE 0.9 % IJ SOLN
3.0000 mL | Freq: Two times a day (BID) | INTRAMUSCULAR | Status: DC
  Administered 2015-04-02: 3 mL via INTRAVENOUS

## 2015-04-02 MED ORDER — LIDOCAINE HCL (PF) 1 % IJ SOLN
INTRAMUSCULAR | Status: AC
  Filled 2015-04-02: qty 30

## 2015-04-02 MED ORDER — BIVALIRUDIN BOLUS VIA INFUSION - CUPID
INTRAVENOUS | Status: DC | PRN
  Administered 2015-04-02: 61.875 mg via INTRAVENOUS

## 2015-04-02 MED ORDER — SODIUM CHLORIDE 0.9 % IJ SOLN: 3.0000 mL | INTRAMUSCULAR | Status: DC | PRN

## 2015-04-02 MED ORDER — SODIUM CHLORIDE 0.9 % IV SOLN: 250.0000 mL | INTRAVENOUS | Status: DC | PRN

## 2015-04-02 MED ORDER — MIDAZOLAM HCL 2 MG/2ML IJ SOLN
INTRAMUSCULAR | Status: AC
  Filled 2015-04-02: qty 2

## 2015-04-02 MED ORDER — FENTANYL CITRATE (PF) 100 MCG/2ML IJ SOLN
INTRAMUSCULAR | Status: AC
  Filled 2015-04-02: qty 2

## 2015-04-02 MED ORDER — NITROGLYCERIN 1 MG/10 ML FOR IR/CATH LAB
INTRA_ARTERIAL | Status: DC | PRN
  Administered 2015-04-02: 17:00:00

## 2015-04-02 MED ORDER — SODIUM CHLORIDE 0.9 % IV SOLN
250.0000 mg | INTRAVENOUS | Status: DC | PRN
  Administered 2015-04-02: 1.75 mg/kg/h via INTRAVENOUS

## 2015-04-02 MED ORDER — SODIUM CHLORIDE 0.9 % IV SOLN
INTRAVENOUS | Status: DC
  Administered 2015-04-02: 08:00:00 via INTRAVENOUS

## 2015-04-02 MED ORDER — SODIUM CHLORIDE 0.9 % IV SOLN: INTRAVENOUS | Status: DC | PRN

## 2015-04-02 MED ORDER — FENTANYL CITRATE (PF) 100 MCG/2ML IJ SOLN
INTRAMUSCULAR | Status: DC | PRN
  Administered 2015-04-02: 25 ug via INTRAVENOUS
  Administered 2015-04-02: 50 ug via INTRAVENOUS

## 2015-04-02 MED ORDER — VERAPAMIL HCL 2.5 MG/ML IV SOLN
INTRAVENOUS | Status: AC
  Filled 2015-04-02: qty 2

## 2015-04-02 MED ORDER — LIDOCAINE HCL (PF) 1 % IJ SOLN
INTRAMUSCULAR | Status: DC | PRN
  Administered 2015-04-02: 5 mL via INTRADERMAL

## 2015-04-02 MED ORDER — BIVALIRUDIN 250 MG IV SOLR
INTRAVENOUS | Status: AC
  Filled 2015-04-02: qty 250

## 2015-04-02 MED ORDER — NITROGLYCERIN 1 MG/10 ML FOR IR/CATH LAB
INTRA_ARTERIAL | Status: AC
  Filled 2015-04-02: qty 10

## 2015-04-02 SURGICAL SUPPLY — 14 items
BALLN EUPHORA RX 2.5X15 (BALLOONS) ×2
BALLN ~~LOC~~ TREK RX 3.5X25 (BALLOONS) ×1 IMPLANT
BALLOON EUPHORA RX 2.5X15 (BALLOONS) IMPLANT
DEVICE RAD COMP TR BAND LRG (VASCULAR PRODUCTS) ×1 IMPLANT
GLIDESHEATH SLEND SS 6F .021 (SHEATH) ×1 IMPLANT
GUIDE CATH RUNWAY 6FR AL 75 (CATHETERS) ×1 IMPLANT
KIT ENCORE 26 ADVANTAGE (KITS) ×2 IMPLANT
KIT HEART LEFT (KITS) ×2 IMPLANT
PACK CARDIAC CATHETERIZATION (CUSTOM PROCEDURE TRAY) ×2 IMPLANT
STENT XIENCE ALPINE RX 2.75X33 (Permanent Stent) ×1 IMPLANT
TRANSDUCER W/STOPCOCK (MISCELLANEOUS) ×2 IMPLANT
TUBING CIL FLEX 10 FLL-RA (TUBING) ×2 IMPLANT
WIRE RUNTHROUGH .014X180CM (WIRE) ×1 IMPLANT
WIRE SAFE-T 1.5MM-J .035X260CM (WIRE) ×1 IMPLANT

## 2015-04-02 NOTE — Interval H&P Note (Signed)
History and Physical Interval Note:  04/02/2015 3:51 PM  Gordon Hayes  has presented today for surgery, with the diagnosis of Staged Intervention  The various methods of treatment have been discussed with the patient and family. After consideration of risks, benefits and other options for treatment, the patient has consented to  Procedure(s): Coronary Stent Intervention (N/A) as a surgical intervention .  The patient's history has been reviewed, patient examined, no change in status, stable for surgery.  I have reviewed the patient's chart and labs.  Questions were answered to the patient's satisfaction.     Kathlyn [place]

## 2015-04-02 NOTE — Progress Notes (Signed)
ANTICOAGULATION CONSULT NOTE - Follow-up Consult  Pharmacy Consult for heparin Indication: chest pain/ACS  Allergies  Allergen Reactions  . Penicillins Anaphylaxis    Patient Measurements: Height: 5\' 10"  (177.8 cm) Weight: 181 lb 14.1 oz (82.5 kg) IBW/kg (Calculated) : 73 Heparin Dosing Weight: 82kg  Vital Signs: Temp: 98.8 F (37.1 C) (12/07 0400) Temp Source: Oral (12/07 0400) BP: 74/63 mmHg (12/07 0600) Pulse Rate: 68 (12/07 0700)  Labs:  Recent Labs  03/31/15 1245 03/31/15 1253 03/31/15 1541 03/31/15 2026  04/01/15 0250 04/01/15 0930 04/01/15 1917 04/02/15 0441  HGB 15.5 17.3*  --   --   --  15.0  --   --  14.3  HCT 46.9 51.0  --   --   --  45.0  --   --  42.4  PLT 216  --   --   --   --  204  --   --  193  HEPARINUNFRC  --   --   --   --   < > 0.11* 0.24* 0.31 0.53  CREATININE 0.91 0.80  --   --   --  0.86  --   --   --   TROPONINI  --   --  19.51* >65.00*  --  >65.00*  --   --   --   < > = values in this interval not displayed.  Estimated Creatinine Clearance: 102.6 mL/min (by C-G formula based on Cr of 0.86).  Assessment: 54 year old male on heparin for severe CAD. S/p cath with DES placed OM, will plan on staged pci to RCA today.  Heparin level 0.53 at goal on 1650 units/hr. CBC stable. No issues with line or bleeding reported per RN.  Goal of Therapy:  Heparin level 0.3-0.7 units/ml Monitor platelets by anticoagulation protocol: Yes   Plan:  heparin at 1650 units/hr Follow up plan for PCI  Erin Hearing PharmD., [facility] Clinical Pharmacist Pager [phone] 04/02/2015 7:37 AM

## 2015-04-02 NOTE — H&P (View-Only) (Signed)
Patient Name: Gordon Hayes Date of Encounter: 04/01/2015  Principal Problem:   Acute ST elevation myocardial infarction (STEMI) of lateral wall [facility]) Active Problems:   Essential hypertension   Atherosclerotic heart disease of native coronary artery with unstable angina pectoris ([place])   STEMI (ST elevation myocardial infarction) [facility])   Primary Cardiologist: Dr Ellyn Hack  Patient Profile: 54 yo male w/ tob use, FH CAD, admitted 12/05 w/ STEMI, had PCI OM2, staged PCI RCA 12/07  SUBJECTIVE: No chest pain or SOB, did well w/ cardiac rehab  OBJECTIVE Filed Vitals:   04/01/15 0600 04/01/15 0716 04/01/15 0740 04/01/15 0800  BP: 99/68 112/80  112/92  Pulse: 66 73  74  Temp:  98.2 F (36.8 C)    TempSrc:  Oral    Resp:  18    Height:      Weight: 179 lb 14.3 oz (81.6 kg)     SpO2: 93% 96% 96% 95%    Intake/Output Summary (Last 24 hours) at 04/01/15 1006 Last data filed at 04/01/15 0800  Gross per 24 hour  Intake 1032.05 ml  Output   2600 ml  Net -1567.95 ml   Filed Weights   03/31/15 1311 03/31/15 1432 04/01/15 0600  Weight: 195 lb (88.451 kg) 180 lb 12.4 oz (82 kg) 179 lb 14.3 oz (81.6 kg)    PHYSICAL EXAM General: Well developed, well nourished, male in no acute distress. Head: Normocephalic, atraumatic.  Neck: Supple without bruits, JVD not elevated. Lungs:  Resp regular and unlabored, CTA. Heart: RRR, S1, S2, no S3, S4, or murmur; no rub. Abdomen: Soft, non-tender, non-distended, BS + x 4.  Extremities: No clubbing, cyanosis, edema. R radial cath site without ecchymosis or hematoma  Neuro: Alert and oriented X 3. Moves all extremities spontaneously. Psych: Normal affect.  LABS: CBC: Recent Labs  03/31/15 1245 03/31/15 1253 04/01/15 0250  WBC 8.3  --  8.9  NEUTROABS 5.6  --   --   HGB 15.5 17.3* 15.0  HCT 46.9 51.0 45.0  MCV 93.8  --  92.8  PLT 216  --  0000000   Basic Metabolic Panel: Recent Labs  03/31/15 1245 03/31/15 1253 03/31/15 1541  04/01/15 0250  NA 136 140  --  140  K 3.9 3.9  --  3.9  CL 107 105  --  109  CO2 20*  --   --  24  GLUCOSE 100* 100*  --  107*  BUN 12 13  --  10  CREATININE 0.91 0.80  --  0.86  CALCIUM 9.0  --   --  8.8*  MG  --   --  1.9  --    Liver Function Tests: Recent Labs  04/01/15 0250  AST 159*  ALT 45  ALKPHOS 53  BILITOT 0.6  PROT 6.2*  ALBUMIN 3.3*   Cardiac Enzymes: Recent Labs  03/31/15 1541 03/31/15 2026 04/01/15 0250  TROPONINI 19.51* >65.00* >65.00*    Recent Labs  03/31/15 1251  TROPIPOC 0.01   Fasting Lipid Panel: Recent Labs  04/01/15 0250  CHOL 140  HDL 46  LDLCALC 80  TRIG 69  CHOLHDL 3.0   Thyroid Function Tests: Recent Labs  03/31/15 1541  TSH 1.059   TELE:  SR, PVCs & 3 bt runs VT      ECG: 12/05 SR, short PR, previous ischemic changes resolved Vent. rate 69 BPM PR interval 108 ms QRS duration 96 ms QT/QTc 446/477 ms P-R-T axes 4 -41 235  Cath: 12/05 1. Severe 2 vessel disease involving OM 2 and mid RCA. 2. 2nd Mrg lesion, 99% stenosed. PCI performed with synergy DES 2.75 mm x 16 mm (3.2 mm). Post intervention, there is a 0% residual stenosis. 3. Lat 2nd Mrg lesion, 80% stenosed. 4. Mid RCA lesion, 85% stenosed. 5. There is mild left ventricular systolic dysfunction. Successful PCI of what appears to be the culprit lesion based on patient having resolution of his anginal symptoms and improved EKG findings. He does still have residual. Lesion in the mid RCA. The inferior branch of OM 2 does have an ostial lesion, but this was felt to be too small and too ostial branch vessel for PCI/PTCA. Will treat medically. Plan:  Admit to CCU for standard post radial cath care.  Restart heparin 6 hours post TR band removal  Anticipate staged PCI of severe flow-limiting lesion of mid RCA on Wednesday.  Dual antiplatelet therapy for minimum one year (would be okay potentially to stop aspirin after 3 months for bleeding issues) Expected  discharge on the following days PCI.  Radiology/Studies: No results found.   Current Medications:  . aspirin  81 mg Oral Daily  . atorvastatin  80 mg Oral q1800  . carvedilol  6.25 mg Oral BID WC  . mometasone-formoterol  1 puff Inhalation BID  . sodium chloride  3 mL Intravenous Q12H  . sodium chloride  3 mL Intravenous Q12H  . ticagrelor  90 mg Oral BID   . heparin 1,400 Units/hr (04/01/15 0334)    ASSESSMENT AND PLAN: Principal Problem:   Acute ST elevation myocardial infarction (STEMI) of lateral wall (HCC) - on ASA, BB, statin - no ACE w/ SBP 90s at times - PCI RCA 12/07 - cardiac rehab seeing  Active Problems:   Essential hypertension - good control on current rx    Atherosclerotic heart disease of native coronary artery with unstable angina pectoris ([place]) - see above     Tobacco use - cessation encouraged  Plan - tx stepdown, PCI in am, possible d/c 12/08  Signed, Rosaria Ferries , PA-C 10:06 AM 04/01/2015 Patient seen and examined. I agree with the assessment and plan as detailed above. See also my additional thoughts below.   Patient is stable. The plan is for staged PCI. He had PCI with acute event yesterday. Schedule for PCI tomorrow on April 02, 2015.  Dola Argyle, MD, [facility] 04/01/2015 11:34 AM

## 2015-04-02 NOTE — Progress Notes (Signed)
The patient is scheduled for PCI today.  Daryel November, MD

## 2015-04-03 ENCOUNTER — Telehealth: Payer: Self-pay | Admitting: Cardiology

## 2015-04-03 ENCOUNTER — Encounter (HOSPITAL_COMMUNITY): Payer: Self-pay | Admitting: Cardiovascular Disease

## 2015-04-03 LAB — BASIC METABOLIC PANEL
Anion gap: 6 (ref 5–15)
BUN: 12 mg/dL (ref 6–20)
CHLORIDE: 105 mmol/L (ref 101–111)
CO2: 26 mmol/L (ref 22–32)
Calcium: 8.2 mg/dL — ABNORMAL LOW (ref 8.9–10.3)
Creatinine, Ser: 0.9 mg/dL (ref 0.61–1.24)
GFR calc non Af Amer: >60 mL/min (ref >60)
Glucose, Bld: 105 mg/dL — ABNORMAL HIGH (ref 65–99)
POTASSIUM: 4.2 mmol/L (ref 3.5–5.1)
SODIUM: 137 mmol/L (ref 135–145)

## 2015-04-03 LAB — CBC
HEMATOCRIT: 36.9 % — AB (ref 39.0–52.0)
HEMOGLOBIN: 12 g/dL — AB (ref 13.0–17.0)
MCH: 30.5 pg (ref 26.0–34.0)
MCHC: 32.5 g/dL (ref 30.0–36.0)
MCV: 93.9 fL (ref 78.0–100.0)
Platelets: 165 10*3/uL (ref 150–400)
RBC: 3.93 MIL/uL — AB (ref 4.22–5.81)
RDW: 13.3 % (ref 11.5–15.5)
WBC: 6.6 10*3/uL (ref 4.0–10.5)

## 2015-04-03 MED ORDER — ATORVASTATIN CALCIUM 80 MG PO TABS: 80.0000 mg | ORAL_TABLET | Freq: Every day | ORAL | Status: DC

## 2015-04-03 MED ORDER — CARVEDILOL 6.25 MG PO TABS: 6.2500 mg | ORAL_TABLET | Freq: Two times a day (BID) | ORAL | Status: DC

## 2015-04-03 MED ORDER — ASPIRIN 81 MG PO TABS: 81.0000 mg | ORAL_TABLET | Freq: Every day | ORAL | Status: DC

## 2015-04-03 MED ORDER — TICAGRELOR 90 MG PO TABS: 90.0000 mg | ORAL_TABLET | Freq: Two times a day (BID) | ORAL | Status: DC

## 2015-04-03 MED ORDER — NITROGLYCERIN 0.4 MG SL SUBL: 0.4000 mg | SUBLINGUAL_TABLET | SUBLINGUAL | Status: DC | PRN

## 2015-04-03 NOTE — Progress Notes (Addendum)
Patient Name:  Gordon Hayes, DOB: Sep 08, 1960, MRN: [id number] Primary Doctor: No primary care provider on file. Primary Cardiologist:   Date: 04/03/2015   SUBJECTIVE:  The patient feels great today. He had slight discomfort in the cath lab yesterday. There has been no recurrence.   Past Medical History  Diagnosis Date  . Back pain, lumbosacral   . DJD (degenerative joint disease) of knee   . Carpal tunnel syndrome, bilateral   . COPD (chronic obstructive pulmonary disease) ([place]) 2013   Filed Vitals:   04/03/15 0700 04/03/15 0748 04/03/15 0800 04/03/15 0826  BP: 91/54  108/76   Pulse: 64  61   Temp:   98.3 F (36.8 C)   TempSrc:   Oral   Resp:   16   Height:      Weight:      SpO2: 96% 98% 98% 100%    Intake/Output Summary (Last 24 hours) at 04/03/15 0923 Last data filed at 04/03/15 0400  Gross per 24 hour  Intake 2811.7 ml  Output   1225 ml  Net 1586.7 ml   Filed Weights   04/01/15 0600 04/02/15 0200 04/03/15 0400  Weight: 179 lb 14.3 oz (81.6 kg) 181 lb 14.1 oz (82.5 kg) 181 lb 3.5 oz (82.2 kg)     LABS: Basic Metabolic Panel:  Recent Labs  03/31/15 1541 04/01/15 0250 04/03/15 0210  NA  --  140 137  K  --  3.9 4.2  CL  --  109 105  CO2  --  24 26  GLUCOSE  --  107* 105*  BUN  --  10 12  CREATININE  --  0.86 0.90  CALCIUM  --  8.8* 8.2*  MG 1.9  --   --    Liver Function Tests:  Recent Labs  04/01/15 0250  AST 159*  ALT 45  ALKPHOS 53  BILITOT 0.6  PROT 6.2*  ALBUMIN 3.3*   No results for input(s): LIPASE, AMYLASE in the last 72 hours. CBC:  Recent Labs  03/31/15 1245  04/02/15 0441 04/03/15 0210  WBC 8.3  < > 9.8 6.6  NEUTROABS 5.6  --   --   --   HGB 15.5  < > 14.3 12.0*  HCT 46.9  < > 42.4 36.9*  MCV 93.8  < > 92.2 93.9  PLT 216  < > 193 165  < > = values in this interval not displayed. Cardiac Enzymes:  Recent Labs  03/31/15 1541 03/31/15 2026 04/01/15 0250  TROPONINI 19.51* >65.00* >65.00*   BNP: Invalid  input(s): POCBNP D-Dimer: No results for input(s): DDIMER in the last 72 hours. Thyroid Function Tests:  Recent Labs  03/31/15 1541  TSH 1.059    RADIOLOGY: Dg Chest 2 View  04/01/2015  CLINICAL DATA:  ST-elevation myocardial infarction EXAM: CHEST  2 VIEW COMPARISON:  11/23/2011 FINDINGS: The heart size and mediastinal contours are within normal limits. Both lungs are clear. The visualized skeletal structures are unremarkable. IMPRESSION: No active cardiopulmonary disease. Electronically Signed   By: Skipper Cliche M.D.   On: 04/01/2015 11:43    PHYSICAL EXAM  patient is oriented to person time and place. Affect is normal. Right radial cath site is stable. Lungs are clear. Respiratory effort is nonlabored. Cardiac exam reveals S1 and S2.   TELEMETRY: I have reviewed telemetry today April 03, 2015. There is normal sinus rhythm.  ECG: I have reviewed his EKGs. He has persistent diffuse T-wave inversions with  no significant change.  ASSESSMENT AND PLAN:    Acute ST elevation myocardial infarction (STEMI) of lateral wall (HCC)   Atherosclerotic heart disease of native coronary artery with unstable angina pectoris [facility])     The patient is now status post acute PCI followed by staged PCI. He is stable. He is ready to go home. He is on a statin. He is on carvedilol. We have not yet been able to start an ACE inhibitor with low blood pressure. He had only mild left ventricular dysfunction in the cath lab. I'm hesitant to add an ACE inhibitor as he ambulates early post MI and procedures procedures. This can be reconsidered when he is seen as an outpatient. He will be discharged home today.   Dola Argyle 04/03/2015 9:23 AM

## 2015-04-03 NOTE — Discharge Summary (Signed)
CARDIOLOGY DISCHARGE SUMMARY   Patient ID: Gordon Hayes MRN: [id number] DOB/AGE: 1961-02-16 54 y.o.  Admit date: 03/31/2015 Discharge date: 04/03/2015  PCP: No primary care provider on file. Primary Cardiologist: Dr Ellyn Hack  Primary Discharge Diagnosis:  Acute ST elevation myocardial infarction (STEMI) of lateral wall [facility]) Secondary Discharge Diagnosis:    Essential hypertension   Atherosclerotic heart disease of native coronary artery with unstable angina pectoris  [facility])  Procedures:   Hospital Course: Gordon Hayes is a 54 y.o. male with a history of tobacco use and premature coronary artery disease in his father and grandfather. No recent medical care, no known history of hypertension, hyperlipidemia or diabetes.   He had sudden onset of SSCP on the day of admission. He went to the ER and code STEMI was activated. He was taken directly to the cath lab.  Initial cath results are below. He had 3 vessel disease, with the OM2 felt to be the culprit lesion. It was stented with a DES. The RCA was significantly stenosed, and staged intervention was planned. An inferior branch of the OM had an 80% lesion but this is a small vessel and is to be treated medically.   He did well post-cath. He was started on a BB and high-dose statin. His was put on Brilinta and ASA and tolerated all the medications well. His SBP was in the 90s at times so no ACE/ARB was initiated.   He was seen by cardiac rehab and educated on MI restrictions, heart-healthy lifestyle modifications and exercise guidelines. Smoking cessation was discussed several times and he is encouraged to quit. He did not feel he needed a nicotine patch.  He was taken back to the cath lab on 12/07 for staged PCI to the RCA. A small RV branch was jailed and he had some chest pain, but it resolved in about 15 minutes.  On 12/08, he was seen by Dr Ron Parker and all data were reviewed. He was having no further chest pain and ambulating  without difficulty. His ECG has evolving MI changes, but is improved from admission. No further inpatient workup is indicated, he is considered stable for discharge, to follow up as an outpatient.    Labs:   Lab Results  Component Value Date   WBC 6.6 04/03/2015   HGB 12.0* 04/03/2015   HCT 36.9* 04/03/2015   MCV 93.9 04/03/2015   PLT 165 04/03/2015     Recent Labs Lab 04/01/15 0250 04/03/15 0210  NA 140 137  K 3.9 4.2  CL 109 105  CO2 24 26  BUN 10 12  CREATININE 0.86 0.90  CALCIUM 8.8* 8.2*  PROT 6.2*  --   BILITOT 0.6  --   ALKPHOS 53  --   ALT 45  --   AST 159*  --   GLUCOSE 107* 105*    Recent Labs  03/31/15 1541 03/31/15 2026 04/01/15 0250  TROPONINI 19.51* >65.00* >65.00*   Lipid Panel     Component Value Date/Time   CHOL 140 04/01/2015 0250   TRIG 69 04/01/2015 0250   HDL 46 04/01/2015 0250   CHOLHDL 3.0 04/01/2015 0250   VLDL 14 04/01/2015 0250   LDLCALC 80 04/01/2015 0250    Recent Labs  04/02/15 1341  INR 1.06      Radiology: Dg Chest 2 View 04/01/2015  CLINICAL DATA:  ST-elevation myocardial infarction EXAM: CHEST  2 VIEW COMPARISON:  11/23/2011 FINDINGS: The heart size and mediastinal contours are within normal limits.  Both lungs are clear. The visualized skeletal structures are unremarkable. IMPRESSION: No active cardiopulmonary disease. Electronically Signed   By: Skipper Cliche M.D.   On: 04/01/2015 11:43    Cardiac Cath: 03/31/2015 1. Severe 2 vessel disease involving OM 2 and mid RCA. 2. 2nd Mrg lesion, 99% stenosed. PCI performed with synergy DES 2.75 mm x 16 mm (3.2 mm). Post intervention, there is a 0% residual stenosis. 3. Lat 2nd Mrg lesion, 80% stenosed. 4. Mid RCA lesion, 85% stenosed. 5. There is mild left ventricular systolic dysfunction. EF 45-50% Successful PCI of what appears to be the culprit lesion based on patient having resolution of his anginal symptoms and improved EKG findings. He does still have residual. Lesion  in the mid RCA. The inferior branch of OM 2 does have an ostial lesion, but this was felt to be too small and too ostial branch vessel for PCI/PTCA. Will treat medically.  Plan:  Admit to CCU for standard post radial cath care.  Restart heparin 6 hours post TR band removal  Anticipate staged PCI of severe flow-limiting lesion of mid RCA on Wednesday.  Dual antiplatelet therapy for minimum one year (would be okay potentially to stop aspirin after 3 months for bleeding issues)  Cardiac Cath: 04/02/2015  Lat 2nd Mrg lesion, 80% stenosed.  Mid RCA lesion, 90% stenosed. Post intervention, there is a 0% residual stenosis. 2. 75 X 33 mm Xience Alpine  Successful angioplasty and drug-eluting stent placement to the mid and proximal right coronary artery. The severe stenosis was in the mid RCA but there is diffuse 70% disease proximally. The stent was postdilated with a 3.5 noncompliant balloon to high pressure. A small RV branch was jailed by the stent with sluggish flow. The patient did have mild chest pain that resolved after about 15 minutes. Recommendations: Dual antiplatelet therapy for at least one year. Aggressive treatment of risk factors.   EKG: 04/03/2015 Normal sinus rhythm, HR 61 Right bundle branch block Inferior infarct , age undetermined T wave abnormality, lateral T wave inversion  FOLLOW UP PLANS AND APPOINTMENTS Allergies  Allergen Reactions  . Penicillins Anaphylaxis     Medication List    STOP taking these medications        albuterol 108 (90 BASE) MCG/ACT inhaler  Commonly known as:  PROVENTIL HFA;VENTOLIN HFA     DAYQUIL MULTI-SYMPTOM COLD/FLU PO     ibuprofen 200 MG tablet  Commonly known as:  ADVIL,MOTRIN     NYQUIL COLD & FLU PO      TAKE these medications        aspirin 81 MG tablet  Take 1 tablet (81 mg total) by mouth daily.     atorvastatin 80 MG tablet  Commonly known as:  LIPITOR  Take 1 tablet (80 mg total) by mouth daily at 6 PM.      carvedilol 6.25 MG tablet  Commonly known as:  COREG  Take 1 tablet (6.25 mg total) by mouth 2 (two) times daily with a meal.     nitroGLYCERIN 0.4 MG SL tablet  Commonly known as:  NITROSTAT  Place 1 tablet (0.4 mg total) under the tongue every 5 (five) minutes as needed for chest pain.     ticagrelor 90 MG Tabs tablet  Commonly known as:  BRILINTA  Take 1 tablet (90 mg total) by mouth 2 (two) times daily.        Discharge Instructions    AMB Referral to Cardiac Rehabilitation - Phase II  Complete by:  As directed   Diagnosis:   Myocardial Infarction PCI       Diet - low sodium heart healthy    Complete by:  As directed      Increase activity slowly    Complete by:  As directed           Follow-up Information    Follow up with Leonie Man, MD.   Specialty:  Cardiology   Why:  MD or PA/NP, the office will call.    Contact information:   [address] [place] [address] [phone]       BRING ALL MEDICATIONS WITH YOU TO FOLLOW UP APPOINTMENTS  Time spent with patient to include physician time: 43 min Signed: Rosaria Ferries, PA-C 04/03/2015, 10:03 AM Co-Sign MD  Patient seen and examined. I agree with the assessment and plan as detailed above. See also my additional thoughts below.   The patient has done well. He is stable and ready to go home today. I have reviewed all of the discharge planning and agree. I made the decision for the patient to be discharged.  Dola Argyle, MD, [facility] 04/04/2015 7:14 AM

## 2015-04-03 NOTE — [place] Notes (Signed)
Discharge instructions and scripts given to patient. Patient verbalizes understanding. PIV d/c'd catheter tip intact. Patient taken to car via wc.

## 2015-04-03 NOTE — Discharge Instructions (Signed)
PLEASE REMEMBER TO BRING ALL OF YOUR MEDICATIONS TO EACH OF YOUR FOLLOW-UP OFFICE VISITS. ° °PLEASE ATTEND ALL SCHEDULED FOLLOW-UP APPOINTMENTS.  ° °Activity: Increase activity slowly as tolerated. You may shower, but no soaking baths (or swimming) for 1 week. No driving for 1 week. No lifting over 5 lbs for 2 weeks. No sexual activity for 1 week.  ° °You May Return to Work: in 3 weeks (if applicable) ° °Wound Care: You may wash cath site gently with soap and water. Keep cath site clean and dry. If you notice pain, swelling, bleeding or pus at your cath site, please call [phone]. ° ° ° °Cardiac Cath Site Care °Refer to this sheet in the next few weeks. These instructions provide you with information on caring for yourself after your procedure. Your caregiver may also give you more specific instructions. Your treatment has been planned according to current medical practices, but problems sometimes occur. Call your caregiver if you have any problems or questions after your procedure. °HOME CARE INSTRUCTIONS °· You may shower 24 hours after the procedure. Remove the bandage (dressing) and gently wash the site with plain soap and water. Gently pat the site dry.  °· Do not apply powder or lotion to the site.  °· Do not sit in a bathtub, swimming pool, or whirlpool for 5 to 7 days.  °· No bending, squatting, or lifting anything over 10 pounds (4.5 kg) as directed by your caregiver.  °· Inspect the site at least twice daily.  °· Do not drive home if you are discharged the same day of the procedure. Have someone else drive you.  °· You may drive 24 hours after the procedure unless otherwise instructed by your caregiver.  °What to expect: °· Any bruising will usually fade within 1 to 2 weeks.  °· Blood that collects in the tissue (hematoma) may be painful to the touch. It should usually decrease in size and tenderness within 1 to 2 weeks.  °SEEK IMMEDIATE MEDICAL CARE IF: °· You have unusual pain at the site or down the  affected limb.  °· You have redness, warmth, swelling, or pain at the site.  °· You have drainage (other than a small amount of blood on the dressing).  °· You have chills.  °· You have a fever or persistent symptoms for more than 72 hours.  °· You have a fever and your symptoms suddenly get worse.  °· Your leg becomes pale, cool, tingly, or numb.  °· You have heavy bleeding from the site. Hold pressure on the site.  °Document Released: 05/15/2010 Document Revised: 04/01/2011 Document Reviewed:  ° °

## 2015-04-03 NOTE — Telephone Encounter (Signed)
TCM phone call . Appt is on 04/14/15 at 1:30pm w/ Rosaria Ferries at [facility] office .Marland Kitchen   Thanks

## 2015-04-03 NOTE — Progress Notes (Signed)
CARDIAC REHAB PHASE I   PRE:  Rate/Rhythm: 64 SR    BP: sitting 97/60    SaO2: 97 RA  MODE:  Ambulation: 740 ft   POST:  Rate/Rhythm: 74 SR    BP: sitting 105/86     SaO2: 100 RA  Tolerated very well. Feels good. Ed completed/reviewed. Plans to quit smoking and understands importance of Brilinta. Planning to attend CRPII.  [id number]   Gordon Hayes CES, ACSM 04/03/2015 10:06 AM

## 2015-04-04 NOTE — Telephone Encounter (Signed)
Patient contacted regarding discharge from [place] on 04/03/15.  Patient understands to follow up with provider Rosaria Ferries on 04/14/15 at 1:30 pm at [facility]. Patient understands discharge instructions? yes  Patient understands medications and regiment? yes Patient understands to bring all medications to this visit? yes

## 2015-04-14 ENCOUNTER — Ambulatory Visit (INDEPENDENT_AMBULATORY_CARE_PROVIDER_SITE_OTHER): Payer: 59 | Admitting: Physician Assistant

## 2015-04-14 ENCOUNTER — Encounter: Payer: Self-pay | Admitting: Physician Assistant

## 2015-04-14 VITALS — BP 100/60 | HR 57 | Ht 70.0 in | Wt 191.0 lb

## 2015-04-14 DIAGNOSIS — I255 Ischemic cardiomyopathy: Secondary | ICD-10-CM | POA: Diagnosis not present

## 2015-04-14 DIAGNOSIS — I25110 Atherosclerotic heart disease of native coronary artery with unstable angina pectoris: Secondary | ICD-10-CM

## 2015-04-14 MED ORDER — ATORVASTATIN CALCIUM 80 MG PO TABS: 80.0000 mg | ORAL_TABLET | Freq: Every day | ORAL | Status: DC

## 2015-04-14 MED ORDER — TICAGRELOR 90 MG PO TABS: 90.0000 mg | ORAL_TABLET | Freq: Two times a day (BID) | ORAL | Status: DC

## 2015-04-14 MED ORDER — CARVEDILOL 6.25 MG PO TABS: 6.2500 mg | ORAL_TABLET | Freq: Two times a day (BID) | ORAL | Status: DC

## 2015-04-14 NOTE — Progress Notes (Signed)
Cardiology Office Note   Date:  04/14/2015   ID:  Gordon Hayes, DOB [birth year], MRN [id number]  PCP:  Jenny Reichmann, MD  Cardiologist:  Dr Nolon Stalls, PA-C   No chief complaint on file.   History of Present Illness: Gordon Hayes is a 54 y.o. male with a history of admission for STEMI 12/05-12/08 w/ PCI of the OM2, RCA. Med rx for OM2 branch 80%. No ACE/ARB due to low BP, low-dose BB started.    Gordon Hayes presents for  Post hospital follow-up.   Gordon Hayes and his wife are here today with a list of questions. The questions were answered and the sheet will be scanned into his chart.  The questions have to do with medications, activity plan return to work as well as questions on cardiac rehabilitation.   This discharge from the hospital, Gordon Hayes has done very well. He has not had any chest pain. He has been a little bit tired and weak , but has generally felt well. He has been walking around the store but gets short of breath easily with this. He has not had orthopnea , edema, or PND. He is trying to eat healthier and is determined to quit smoking cold [place]. He is not wearing a patch and feels he is doing well with this. He has felt cold at times and his feet were cold at first when he went home, but this is getting better.   Past Medical History  Diagnosis Date  . Back pain, lumbosacral   . DJD (degenerative joint disease) of knee   . Carpal tunnel syndrome, bilateral   . COPD (chronic obstructive pulmonary disease) ([place]) 2013  . STEMI (ST elevation myocardial infarction) ([place])  03/31/2015    OM2 99>0% w/ 2.75 mm x 16 mm (3.2 mm) Synergy DES; OM2 branch 80%, med rx, pRCA 70%, mRCA 90% (s/p staged PCI w/ 2. 75 X 33 mm Xience Alpine DES); EF 45-50%    Past Surgical History  Procedure Laterality Date  . Posterior laminectomy / decompression lumbar spine  2007  . Hernia repair      right inguinal  . Tonsillectomy    . Cardiac catheterization N/A  03/31/2015    Procedure: Left Heart Cath and Coronary Angiography;  Surgeon: Leonie Man, MD;  Location: [facility];  Service: Cardiovascular;  Laterality: N/A;  . Cardiac catheterization N/A 03/31/2015    Procedure: Coronary Stent Intervention;  Surgeon: Leonie Man, MD;  Location: [facility];  Service: Cardiovascular;  Laterality: N/A;  . Cardiac catheterization N/A 04/02/2015    Procedure: Coronary Stent Intervention;  Surgeon: Wellington Hampshire, MD;  Location: [facility];  Service: Cardiovascular;  Laterality: N/A;    Current Outpatient Prescriptions  Medication Sig Dispense Refill  . aspirin 81 MG tablet Take 1 tablet (81 mg total) by mouth daily.    Marland Kitchen atorvastatin (LIPITOR) 80 MG tablet Take 1 tablet (80 mg total) by mouth daily at 6 PM. 30 tablet 11  . carvedilol (COREG) 6.25 MG tablet Take 1 tablet (6.25 mg total) by mouth 2 (two) times daily with a meal. 60 tablet 11  . nitroGLYCERIN (NITROSTAT) 0.4 MG SL tablet Place 1 tablet (0.4 mg total) under the tongue every 5 (five) minutes as needed for chest pain. 25 tablet 3  . ticagrelor (BRILINTA) 90 MG TABS tablet Take 1 tablet (90 mg total) by mouth 2 (two) times daily.  60 tablet 11   No current facility-administered medications for this visit.    Allergies:   Penicillins    Social History:  The patient  reports that he quit smoking about 2 weeks ago. His smoking use included Cigarettes. He smoked 0.80 packs per day. He has quit using smokeless tobacco. He reports that he drinks alcohol. He reports that he does not use illicit drugs.   Family History:  The patient's family history includes Cancer in his maternal grandfather and paternal grandmother; Cancer (age of onset: 69) in his father; Hearing loss in his mother; Heart attack in his father; Heart disease in his father and paternal grandfather; Hypertension in his mother; Stroke in his father. There is no history of Colon cancer, Esophageal cancer, Rectal  cancer, or Stomach cancer.    ROS:  Please see the history of present illness. All other systems are reviewed and negative.    PHYSICAL EXAM: VS:  BP 100/60 mmHg  Pulse 57  Ht 5\' 10"  (1.778 m)  Wt 191 lb (86.637 kg)  BMI 27.41 kg/m2 , BMI Body mass index is 27.41 kg/(m^2). GEN: Well nourished, well developed, male in no acute distress HEENT: normal for age  Neck: no JVD, no carotid bruit, no masses Cardiac: RRR; no murmur, no rubs, or gallops Respiratory: slightly decreased breath sounds bases bilaterally, normal work of breathing GI: soft, nontender, nondistended, + BS MS: no deformity or atrophy; no edema; distal pulses are 2+ in all 4 extremities;  Right radial cath site has healed and has a good pulse  Skin: warm and dry, no rash Neuro:  Strength and sensation are intact Psych: euthymic mood, full affect   EKG:  EKG is ordered today. The ekg ordered today demonstrates  Sinus rhythm with evolving MI changes   03/31/2015 cath   Severe 2 vessel disease involving OM 2 and mid RCA.  2nd Mrg lesion, 99% stenosed. PCI performed with synergy DES 2.75 mm x 16 mm (3.2 mm). Post intervention, there is a 0% residual stenosis.  Lat 2nd Mrg lesion, 80% stenosed.  Mid RCA lesion, 85% stenosed.  There is mild left ventricular systolic dysfunction. EF 45-50 percent Successful PCI of what appears to be the culprit lesion based on patient having resolution of his anginal symptoms and improved EKG findings. He does still have residual. Lesion in the mid RCA. The inferior branch of OM 2 does have an ostial lesion, but this was felt to be too small and too ostial branch vessel for PCI/PTCA. Will treat medically.   04/02/2015 cath  Lat 2nd Mrg lesion, 80% stenosed.  Mid RCA lesion, 90% stenosed. Post intervention, there is a 0% residual stenosis. Successful angioplasty and drug-eluting stent placement to the mid and proximal right coronary artery. The severe stenosis was in the mid RCA but  there is diffuse 70% disease proximally. The stent was postdilated with a 3.5 noncompliant balloon to high pressure. A small RV branch was jailed by the stent with sluggish flow. The patient did have mild chest pain that resolved after about 15 minutes. Recommendations: Dual antiplatelet therapy for at least one year. Aggressive treatment of risk factors.  Recent Labs: 03/31/2015: Magnesium 1.9; TSH 1.059 04/01/2015: ALT 45 04/03/2015: BUN 12; Creatinine, Ser 0.90; Hemoglobin 12.0*; Platelets 165; Potassium 4.2; Sodium 137  Lipid Panel    Component Value Date/Time   CHOL 140 04/01/2015 0250   TRIG 69 04/01/2015 0250   HDL 46 04/01/2015 0250   CHOLHDL 3.0 04/01/2015 0250  VLDL 14 04/01/2015 0250   LDLCALC 80 04/01/2015 0250     Wt Readings from Last 3 Encounters:  04/14/15 191 lb (86.637 kg)  04/03/15 181 lb 3.5 oz (82.2 kg)  02/01/12 190 lb (86.183 kg)     Other studies Reviewed: Additional studies/ records that were reviewed today include:  Hospital records and Reports.  ASSESSMENT AND PLAN:  1.   Recent STEMI with two-vessel CAD: he had stents to 2 vessels and has a small 80% OM branch that is to be treated medically. Additionally, a small RV branch was jailed by the RCA stent. He is tolerating his medications well. His blood pressure is not high enough to add an ACE inhibitor but he is to continue on the aspirin, Brilinta, statin and beta blocker.   2. Ischemic cardiomyopathy: blood pressure not elevated enough to add an ACE inhibitor but he is on a beta blocker. No symptoms of volume overload. We will check an echocardiogram in 3 months   3. Lipid status: he wonders why he is on a statin since his cholesterol profiles. Good. Advised him that for now the Lipitor is to help his heart recover from the MI. He may be on a lower dose in the future but will still need to be on a statin in order to help him not build plaque   4. Tobacco use: The patient does not need any medications  and is determined to quit cold [place]. He is encouraged in this. He gets a lot of secondhand smoke at work and it appears any way to avoid this,  He will pursue it   Current medicines are reviewed at length with the patient today.  The patient has concerns regarding medicines.  Concerns were addressed  The following changes have been made:  no change  Labs/ tests ordered today include:   Orders Placed This Encounter  Procedures  . Lipid panel  . Hepatic function panel  . EKG 12-Lead  . Echocardiogram     Disposition: Follow-up with Dr. Ellyn Hack  Signed, Lenoard Aden  04/14/2015 2:13 PM    [facility] [place], [place],   [address] Phone: [phone]; Fax: [phone]

## 2015-04-14 NOTE — Patient Instructions (Signed)
Medication Instructions:   NO CHANGE-REFILLS HAVE BEEN SENT TO THE PHARMACY  Labwork:  Your physician recommends that you return for lab work in [place] [address] HARD[place]-DO NOT EAT PRIOR TO HAVING LAB WORK DRAWN  Testing/Procedures:  Your physician has requested that you have an echocardiogram. Echocardiography is a painless test that uses sound waves to create images of your heart. It provides your doctor with information about the size and shape of your heart and how well your heart's chambers and valves are working. This procedure takes approximately one hour. There are no restrictions for this procedure.SCHEDULE IN 3 MONTHS PRIOR TO APPT WITH DR Ellyn Hack    Follow-Up:  Your physician recommends that you schedule a follow-up appointment in: [place]   If you need a refill on your cardiac medications before your next appointment, please call your pharmacy.

## 2015-04-16 ENCOUNTER — Telehealth: Payer: Self-pay | Admitting: Cardiology

## 2015-04-16 NOTE — Telephone Encounter (Signed)
Mr. Cobarrubias is calling because the pharmacy ([facility] on [address]) is needing a pre- Auth for his Brilinta .Marland KitchenPlease call .  Thanks

## 2015-04-18 NOTE — Telephone Encounter (Signed)
Pt calling again,says he still have not received his Brilinta.

## 2015-04-18 NOTE — Telephone Encounter (Signed)
Prior auth obtained. Patient informed.

## 2015-04-22 ENCOUNTER — Telehealth: Payer: Self-pay

## 2015-04-22 NOTE — Telephone Encounter (Signed)
Brilinta 90 mg approved.

## 2015-04-30 ENCOUNTER — Telehealth (HOSPITAL_COMMUNITY): Payer: Self-pay

## 2015-04-30 NOTE — Telephone Encounter (Signed)
Pt declined CRPII. Pt is already exercising at home and feels that he can do it on his own. Pt denies CP, SOB or extreme/unsual fatigue with exercise. Did express the importance of exercise and nutrition/education. Gave pt contact information in case he changes his mind in the future.

## 2015-05-29 ENCOUNTER — Telehealth: Payer: Self-pay | Admitting: Cardiology

## 2015-05-29 NOTE — Telephone Encounter (Signed)
Patient brought AUTH/Pmt to office for Attending Physician [place].  (He had just dropped off Attending Physician Statement forms at front desk.on 05/27/15.)   I contacted patient and he came back in office today.  Sent AUTH/ Money Order/' APS Forms to [place] @ [facility] [employer] 05/29/15 via Courier to be processed. lp

## 2015-06-04 ENCOUNTER — Telehealth: Payer: Self-pay | Admitting: Cardiology

## 2015-06-04 NOTE — Telephone Encounter (Signed)
Received Disability Forms ([place]) back from [place] @ [place] for DR Ellyn Hack to review, complete and sign.  Forms given to Trixie Dredge, RN for Dr Ellyn Hack. 06/04/15.

## 2015-06-12 ENCOUNTER — Telehealth: Payer: Self-pay | Admitting: Cardiology

## 2015-06-12 NOTE — Telephone Encounter (Signed)
Error

## 2015-06-13 ENCOUNTER — Telehealth: Payer: Self-pay | Admitting: Cardiology

## 2015-06-13 ENCOUNTER — Telehealth: Payer: Self-pay | Admitting: *Deleted

## 2015-06-13 NOTE — Telephone Encounter (Signed)
Received completed and signed Disability Forms back from Dr Ellyn Hack.  Notified patient forms ready to pick up. lp

## 2015-06-13 NOTE — Telephone Encounter (Signed)
Spoke to patient Per Gordon Hayes- PATIENT MAY RETURN TO WORK , WITHOUT RESTRICTION . PATIENT AWARE. WILL RETURN TO WORK 06/16/15 FORMS COMPLETED

## 2015-06-25 HISTORY — PX: TRANSTHORACIC ECHOCARDIOGRAM: SHX275

## 2015-07-14 ENCOUNTER — Ambulatory Visit (HOSPITAL_COMMUNITY): Payer: BLUE CROSS/BLUE SHIELD | Attending: Cardiology

## 2015-07-14 ENCOUNTER — Other Ambulatory Visit: Payer: Self-pay

## 2015-07-14 DIAGNOSIS — I071 Rheumatic tricuspid insufficiency: Secondary | ICD-10-CM | POA: Insufficient documentation

## 2015-07-14 DIAGNOSIS — J449 Chronic obstructive pulmonary disease, unspecified: Secondary | ICD-10-CM | POA: Insufficient documentation

## 2015-07-14 DIAGNOSIS — I2510 Atherosclerotic heart disease of native coronary artery without angina pectoris: Secondary | ICD-10-CM | POA: Insufficient documentation

## 2015-07-14 DIAGNOSIS — I252 Old myocardial infarction: Secondary | ICD-10-CM | POA: Diagnosis not present

## 2015-07-14 DIAGNOSIS — I340 Nonrheumatic mitral (valve) insufficiency: Secondary | ICD-10-CM | POA: Insufficient documentation

## 2015-07-14 DIAGNOSIS — I119 Hypertensive heart disease without heart failure: Secondary | ICD-10-CM | POA: Diagnosis not present

## 2015-07-14 DIAGNOSIS — Z87891 Personal history of nicotine dependence: Secondary | ICD-10-CM | POA: Insufficient documentation

## 2015-07-14 DIAGNOSIS — I255 Ischemic cardiomyopathy: Secondary | ICD-10-CM | POA: Diagnosis not present

## 2015-07-14 DIAGNOSIS — I429 Cardiomyopathy, unspecified: Secondary | ICD-10-CM | POA: Diagnosis present

## 2015-07-15 LAB — HEPATIC FUNCTION PANEL
ALBUMIN: 4 g/dL (ref 3.6–5.1)
ALT: 27 U/L (ref 9–46)
AST: 17 U/L (ref 10–35)
Alkaline Phosphatase: 59 U/L (ref 40–115)
Bilirubin, Direct: 0.1 mg/dL (ref <=0.2)
Indirect Bilirubin: 0.3 mg/dL (ref 0.2–1.2)
TOTAL PROTEIN: 6.3 g/dL (ref 6.1–8.1)
Total Bilirubin: 0.4 mg/dL (ref 0.2–1.2)

## 2015-07-15 LAB — LIPID PANEL
CHOL/HDL RATIO: 2.2 ratio (ref <=5.0)
CHOLESTEROL: 103 mg/dL — AB (ref 125–200)
HDL: 46 mg/dL (ref >=40)
LDL Cholesterol: 39 mg/dL (ref <130)
TRIGLYCERIDES: 88 mg/dL (ref <150)
VLDL: 18 mg/dL (ref <30)

## 2015-07-24 ENCOUNTER — Telehealth: Payer: Self-pay | Admitting: Cardiology

## 2015-07-24 NOTE — Telephone Encounter (Signed)
Not available, no voice mail. Dialed, goes to message stating patient is not taking calls at this time.

## 2015-07-24 NOTE — Telephone Encounter (Signed)
Follow Up  ° °Pt returned the call  °

## 2015-07-25 NOTE — Telephone Encounter (Signed)
2nd call attempt.

## 2015-07-27 ENCOUNTER — Encounter: Payer: Self-pay | Admitting: Cardiology

## 2015-07-28 ENCOUNTER — Ambulatory Visit (INDEPENDENT_AMBULATORY_CARE_PROVIDER_SITE_OTHER): Payer: BLUE CROSS/BLUE SHIELD | Admitting: Cardiology

## 2015-07-28 ENCOUNTER — Encounter: Payer: Self-pay | Admitting: Cardiology

## 2015-07-28 VITALS — BP 122/88 | HR 75 | Ht 70.0 in | Wt 213.0 lb

## 2015-07-28 DIAGNOSIS — I25110 Atherosclerotic heart disease of native coronary artery with unstable angina pectoris: Secondary | ICD-10-CM | POA: Diagnosis not present

## 2015-07-28 DIAGNOSIS — Z79899 Other long term (current) drug therapy: Secondary | ICD-10-CM

## 2015-07-28 DIAGNOSIS — I10 Essential (primary) hypertension: Secondary | ICD-10-CM

## 2015-07-28 DIAGNOSIS — I2129 ST elevation (STEMI) myocardial infarction involving other sites: Secondary | ICD-10-CM

## 2015-07-28 DIAGNOSIS — E785 Hyperlipidemia, unspecified: Secondary | ICD-10-CM

## 2015-07-28 MED ORDER — ATORVASTATIN CALCIUM 80 MG PO TABS: 40.0000 mg | ORAL_TABLET | Freq: Every day | ORAL | Status: DC

## 2015-07-28 NOTE — Assessment & Plan Note (Signed)
Culprit lesion was clearly the circumflex OM lesion treated initially. Lead to relief of symptoms. He has had no further anginal symptoms. Normal EF and normal wall motion by echocardiogram. He is on aspirin plus Brilinta along with high-dose statin which I think can decrease the dose. He is also taking carvedilol. Currently his blood pressure stable, so would not add ACE inhibitor or ARB.

## 2015-07-28 NOTE — Assessment & Plan Note (Signed)
He still has an 80% lesion in a small branch of previously occluded OM. He is not having any angina from that. He does have when necessary nitroglycerin. He is on stable dose of carvedilol. Remains on dual antiplatelet therapy and statin.

## 2015-07-28 NOTE — Assessment & Plan Note (Signed)
Recent labs show pretty excellent lipid control. I think were easily able to reduce Lipitor to 40 mg daily. I would like to give him at least 2 weeks off of Lipitor to see if his myalgias and arthralgias improve. He can then restart at 40 mg daily if tolerated. If not we can try 40 mg every other day. Reassess in 6 months

## 2015-07-28 NOTE — Assessment & Plan Note (Signed)
Borderline diastolic pressure, but for now I will simply continue carvedilol at current dose

## 2015-07-28 NOTE — Patient Instructions (Signed)
Hold  Atorvastatin for 2 weeks ,then restart at 40 mg take everyday if you can not tolerate that dose got to every other day until bottle is complete Call the office and let us know how you are you feeling.  Please have labs done in 4 months will mail you the lab slip.   Your physician wants you to follow-up in Nov 2017 with Dr Ellyn Hack.  You will receive a reminder letter in the mail two months in advance. If you don't receive a letter, please call our office to schedule the follow-up appointment.   If you need a refill on your cardiac medications before your next appointment, please call your pharmacy.

## 2015-07-28 NOTE — Progress Notes (Signed)
PCP: Jenny Reichmann, MD  Clinic Note: Chief Complaint  Patient presents with  . Follow-up    pt c/o of muscle aches  . Coronary Artery Disease  . Code STEMI    HPI: Gordon Hayes is a 55 y.o. male with a PMH below who presents today for 3 month f/u for CAD-STEMI-PCI.Marland Kitchen  Gordon Hayes was last seen on Dec 19th, 2016 by Rosaria Ferries, PA for initial post-hospital f/u.He noted a little bit exertional dyspnea at the time, but was otherwise doing better. Was trying to watch his dietary intake. He has also been determined to quit smoking.  Recent Hospitalizations: 03/31/2015  Studies Reviewed:  *Cath-PCI-staged PCI from Dec 2016 reviewed & data entered in PMH/PSH *  03/31/2015   Coronary Stent Intervention   Left Heart Cath and Coronary Angiography    Conclusion    1. Severe 2 vessel disease involving OM 2 and mid RCA. 2. 2nd Mrg lesion, 99% stenosed. PCI performed with synergy DES 2.75 mm x 16 mm (3.2 mm). Post intervention, there is a 0% residual stenosis. 3. Lat 2nd Mrg lesion, 80% stenosed. 4. Mid RCA lesion, 85% stenosed. 5. There is mild left ventricular systolic dysfunction.  Successful PCI of what appears to be the culprit lesion - OM2 Staged PCI o RCA on 12/7: --> Successful angioplasty and drug-eluting stent placement to the mid and proximal right coronary artery. The severe stenosis was in the mid RCA but there is diffuse 70% disease proximally. The stent was postdilated with a 3.5 noncompliant balloon to high pressure.  The inferior branch of OM 2 does have an ostial lesion, but this was felt to be too small and too ostial branch vessel for PCI/PTCA. Will treat medically.   * Echo from 06/2015 reviewed & data entered in PMH/PSH: mild LVH. EF 55-60%. Normal Wall Motion. Gr 1 DD. Trace MR/TR   Interval History: He presents today really only complaint of having diffuse muscle aching feet pain the point where he is really been unable to do the amount of exercise that  he would like to do. He is concerned as he has gained a significant amount of weight as a result of this. He also says that he feels like without exercises energy level has gone down. Pain in both his joints and his muscles most notably knees and ankles as well as the lower legs and feet.  He has successfully quit smoking cold [place]. Declined any assistance with medications. Despite this, he continues to be active and is very physically active at work. He denies any recurrent symptoms suggestive of his angina with either rest or exertion. He denies any PND, orthopnea or edema. No exertional dyspnea. No heart failure symptoms of PND, orthopnea or edema.  No palpitations, lightheadedness, dizziness, weakness or syncope/near syncope. No TIA/amaurosis fugax symptoms. No melena, hematochezia, hematuria, or epstaxis. No claudication.  ROS: A comprehensive was performed. Review of Systems  Constitutional: Negative for weight loss (Despite trying to eat, he has not been on exercise because of his myalgias and arthralgias) and malaise/fatigue.  HENT: Negative for nosebleeds.   Eyes: Negative for blurred vision.  Musculoskeletal: Positive for myalgias and joint pain.  Neurological: Negative for dizziness and headaches.  Endo/Heme/Allergies: Does not bruise/bleed easily.  All other systems reviewed and are negative.    Past Medical History  Diagnosis Date  . Back pain, lumbosacral   . DJD (degenerative joint disease) of knee   . Carpal tunnel syndrome, bilateral   .  COPD (chronic obstructive pulmonary disease) ([place]) 2013  . STEMI (ST elevation myocardial infarction) ([place])  03/31/2015    Subtotal OM2 - PCI with DES. EF 45-50%; Staged PCI of RCA  . CAD S/P percutaneous coronary angioplasty 03/2015    OM2 99>0% w/ 2.75 mm x 16 mm (3.2 mm) Synergy DES; OM2 branch 80%, med rx, pRCA 70%, mRCA 90% (s/p staged PCI w/ 2. 75 X 33 mm Xience Alpine DES);     Past Surgical History  Procedure Laterality  Date  . Posterior laminectomy / decompression lumbar spine  2007  . Hernia repair      right inguinal  . Tonsillectomy    . Cardiac catheterization N/A 03/31/2015    Procedure: Left Heart Cath and Coronary Angiography;  Surgeon: Leonie Man, MD;  Location: [facility];  Service: Cardiovascular;  Subtotoal OM2, 85% RCA with diffuse upstream Dz. EF 45-50%  . Cardiac catheterization N/A 03/31/2015    Procedure: Coronary Stent Intervention;  Surgeon: Leonie Man, MD;  Location: [facility];  Service: Cardiovascular;  Laterality: N/A;  . Cardiac catheterization N/A 04/02/2015    Procedure: Coronary Stent Intervention;  Surgeon: Wellington Hampshire, MD;  Location: [facility];  Service: Cardiovascular;  Laterality: N/A;  . Transthoracic echocardiogram  06/2015    EF 55-60%, mild LVH. Normal wall motion. Gr 1 DD.    MEDS Allergies  Allergen Reactions  . Penicillins Anaphylaxis    Social History   Social History  . Marital Status: Married    Spouse Name: [name]  . Number of Children: 3  . Years of Education: 12   Occupational History  . [place] History Main Topics  . Smoking status: Former Smoker -- 0.80 packs/day    Types: Cigarettes    Quit date: 03/31/2015  . Smokeless tobacco: Former Systems developer     Comment: trying to-"you gotta have some will power.  I don't have my mind right to do it yet."  . Alcohol Use: 0.0 - 2.4 oz/week    0-4 Cans of beer per week  . Drug Use: No  . Sexual Activity:    Partners: Female   Other Topics Concern  . None   Social History Narrative   Denied application for additional life insurance policy due to recent diagnosis of COPD.   Family History  Problem Relation Age of Onset  . Hearing loss Mother   . Cancer Father 31    prostate, bladder  . Heart disease Father   . Stroke Father   . Cancer Maternal Grandfather   . Cancer Paternal Grandmother   . Heart disease Paternal Grandfather   . Colon  cancer Neg Hx   . Esophageal cancer Neg Hx   . Rectal cancer Neg Hx   . Stomach cancer Neg Hx   . Heart attack Father     2  . Hypertension Mother     Wt Readings from Last 3 Encounters:  07/28/15 213 lb (96.616 kg)  04/14/15 191 lb (86.637 kg)  04/03/15 181 lb 3.5 oz (82.2 kg)  -b/c not being able to exercise like he used to  PHYSICAL EXAM  BP 122/88 mmHg  Pulse 75  Ht 5\' 10"  (1.778 m)  Wt 213 lb (96.616 kg)  BMI 30.56 kg/m2 General appearance: alert, cooperative, appears stated age, no distress and not really obese - more muscular Neck: no adenopathy, no carotid bruit and no JVD Lungs:  clear to auscultation bilaterally, normal percussion bilaterally and non-labored Heart: regular rate and rhythm, S1, S2 normal, no murmur, click, rub or gallop; non-displaced PMI Abdomen: soft, non-tender; bowel sounds normal; no masses,  no organomegaly; no HJR Extremities: extremities normal, atraumatic, no cyanosis, or edema Pulses: 2+ and symmetric Skin: mobility and turgor normal  Neurologic: Mental status: Alert, oriented, thought content appropriate Cranial nerves: normal (II-XII grossly intact)    Adult ECG Report  Rate: 75 ;  Rhythm: normal sinus rhythm and Borderline incomplete right bundle branch block. Otherwise normal axis, intervals and durations.;   Narrative Interpretation: Improved EKG with resolved dynamic changes of inferior ST elevation MI. No Q waves noted. ST segments are back to normal.   Other studies Reviewed: Additional studies/ records that were reviewed today include:  Recent Labs:   Lab Results  Component Value Date   CHOL 103* 07/14/2015   HDL 46 07/14/2015   LDLCALC 39 07/14/2015   TRIG 88 07/14/2015   CHOLHDL 2.2 07/14/2015    ASSESSMENT / PLAN: Problem List Items Addressed This Visit    ST elevation myocardial infarction (STEMI) of lateral wall, subsequent episode of care ([place]) (Chronic)    Culprit lesion was clearly the circumflex OM lesion  treated initially. Lead to relief of symptoms. He has had no further anginal symptoms. Normal EF and normal wall motion by echocardiogram. He is on aspirin plus Brilinta along with high-dose statin which I think can decrease the dose. He is also taking carvedilol. Currently his blood pressure stable, so would not add ACE inhibitor or ARB.      Relevant Medications   atorvastatin (LIPITOR) 80 MG tablet   Hyperlipidemia with target LDL less than 70 (Chronic)    Recent labs show pretty excellent lipid control. I think were easily able to reduce Lipitor to 40 mg daily. I would like to give him at least 2 weeks off of Lipitor to see if his myalgias and arthralgias improve. He can then restart at 40 mg daily if tolerated. If not we can try 40 mg every other day. Reassess in 6 months      Relevant Medications   atorvastatin (LIPITOR) 80 MG tablet   Other Relevant Orders   EKG 12-Lead   Lipid panel   Comprehensive metabolic panel   Essential hypertension - Primary (Chronic)    Borderline diastolic pressure, but for now I will simply continue carvedilol at current dose      Relevant Medications   atorvastatin (LIPITOR) 80 MG tablet   Other Relevant Orders   EKG 12-Lead   Lipid panel   Comprehensive metabolic panel   Atherosclerotic heart disease of native coronary artery with unstable angina pectoris ([place]) (Chronic)    He still has an 80% lesion in a small branch of previously occluded OM. He is not having any angina from that. He does have when necessary nitroglycerin. He is on stable dose of carvedilol. Remains on dual antiplatelet therapy and statin.      Relevant Medications   atorvastatin (LIPITOR) 80 MG tablet   Other Relevant Orders   EKG 12-Lead   Lipid panel   Comprehensive metabolic panel    Other Visit Diagnoses    Drug therapy        Relevant Orders    EKG 12-Lead    Lipid panel    Comprehensive metabolic panel       Current medicines are reviewed at length with  the patient today. (+/- concerns) Lipitor side effects The  following changes have been made:  Lipitor --> Hold x 2 wks then restart @ 40 mg.    Studies Ordered:   Orders Placed This Encounter  Procedures  . Lipid panel  . Comprehensive metabolic panel  . EKG 12-Lead    ROV November. Would labs checked in roughly 4 months   HARDING, Leonie Green, M.D., M.S. Interventional Cardiologist   Pager # [phone] Phone # [phone] [address] [place], [place] [address]

## 2015-09-11 ENCOUNTER — Telehealth: Payer: Self-pay | Admitting: *Deleted

## 2015-09-11 NOTE — Telephone Encounter (Signed)
Spoke to patient  patient states he tried taking atorvastatin  40 mg as prescribed  patient has pain legs, knees ,since starting medications - he stated he stopped for a week the pain went away, restarted and pain came back  he called wanted Dr Ellyn Hack to know.  Patient states Dr Ellyn Hack would try something else.  patient aware will defer to Dr Ellyn Hack

## 2015-09-12 NOTE — Telephone Encounter (Signed)
Change to Crestor 10 mg - start every other day x 1 month, then increase to daily if he does OK.  Glenetta Hew, MD

## 2015-09-15 MED ORDER — ROSUVASTATIN CALCIUM 10 MG PO TABS: 10.0000 mg | ORAL_TABLET | Freq: Every day | ORAL | Status: DC

## 2015-09-15 NOTE — Telephone Encounter (Signed)
Spoke to patient aware to change to generic crestor 10 mg Contact office if there is an issue. Verbalized understanding.

## 2015-10-30 ENCOUNTER — Ambulatory Visit (INDEPENDENT_AMBULATORY_CARE_PROVIDER_SITE_OTHER): Payer: BLUE CROSS/BLUE SHIELD | Admitting: Family Medicine

## 2015-10-30 ENCOUNTER — Encounter: Payer: Self-pay | Admitting: Family Medicine

## 2015-10-30 VITALS — BP 120/77 | HR 76 | Ht 70.0 in | Wt 200.5 lb

## 2015-10-30 DIAGNOSIS — Z7901 Long term (current) use of anticoagulants: Secondary | ICD-10-CM

## 2015-10-30 DIAGNOSIS — I2510 Atherosclerotic heart disease of native coronary artery without angina pectoris: Secondary | ICD-10-CM | POA: Diagnosis not present

## 2015-10-30 DIAGNOSIS — Z789 Other specified health status: Secondary | ICD-10-CM | POA: Diagnosis not present

## 2015-10-30 DIAGNOSIS — Z720 Tobacco use: Secondary | ICD-10-CM | POA: Diagnosis not present

## 2015-10-30 DIAGNOSIS — E785 Hyperlipidemia, unspecified: Secondary | ICD-10-CM

## 2015-10-30 DIAGNOSIS — I10 Essential (primary) hypertension: Secondary | ICD-10-CM

## 2015-10-30 DIAGNOSIS — Z87891 Personal history of nicotine dependence: Secondary | ICD-10-CM

## 2015-10-30 DIAGNOSIS — I2129 ST elevation (STEMI) myocardial infarction involving other sites: Secondary | ICD-10-CM

## 2015-10-30 DIAGNOSIS — J42 Unspecified chronic bronchitis: Secondary | ICD-10-CM

## 2015-10-30 DIAGNOSIS — Z9861 Coronary angioplasty status: Secondary | ICD-10-CM

## 2015-10-30 NOTE — Patient Instructions (Signed)
Heart-Healthy Eating Plan °Many factors influence your heart health, including eating and exercise habits. Heart (coronary) risk increases with abnormal blood fat (lipid) levels. Heart-healthy meal planning includes limiting unhealthy fats, increasing healthy fats, and making other small dietary changes. This includes maintaining a healthy body weight to help keep lipid levels within a normal range. °WHAT IS MY PLAN?  °Your health care provider recommends that you: °· Get no more than _________% of the total calories in your daily diet from fat. °· Limit your intake of saturated fat to less than _________% of your total calories each day. °· Limit the amount of cholesterol in your diet to less than _________ mg per day. °WHAT TYPES OF FAT SHOULD I CHOOSE? °· Choose healthy fats more often. Choose monounsaturated and polyunsaturated fats, such as olive oil and canola oil, flaxseeds, walnuts, almonds, and seeds. °· Eat more omega-3 fats. Good choices include salmon, mackerel, sardines, tuna, flaxseed oil, and ground flaxseeds. Aim to eat fish at least two times each week. °· Limit saturated fats. Saturated fats are primarily found in animal products, such as meats, butter, and cream. Plant sources of saturated fats include palm oil, palm kernel oil, and coconut oil. °· Avoid foods with partially hydrogenated oils in them. These contain trans fats. Examples of foods that contain trans fats are stick margarine, some tub margarines, cookies, crackers, and other baked goods. °WHAT GENERAL GUIDELINES DO I NEED TO FOLLOW? °· Check food labels carefully to identify foods with trans fats or high amounts of saturated fat. °· Fill one half of your plate with vegetables and green salads. Eat 4-5 servings of vegetables per day. A serving of vegetables equals 1 cup of raw leafy vegetables, ½ cup of raw or cooked cut-up vegetables, or ½ cup of vegetable juice. °· Fill one fourth of your plate with whole grains. Look for the word  "whole" as the first word in the ingredient list. °· Fill one fourth of your plate with lean protein foods. °· Eat 4-5 servings of fruit per day. A serving of fruit equals one medium whole fruit, ¼ cup of dried fruit, ½ cup of fresh, frozen, or canned fruit, or ½ cup of 100% fruit juice. °· Eat more foods that contain soluble fiber. Examples of foods that contain this type of fiber are apples, broccoli, carrots, beans, peas, and barley. Aim to get 20-30 g of fiber per day. °· Eat more home-cooked food and less restaurant, buffet, and fast food. °· Limit or avoid alcohol. °· Limit foods that are high in starch and sugar. °· Avoid fried foods. °· Cook foods by using methods other than frying. Baking, boiling, grilling, and broiling are all great options. Other fat-reducing suggestions include: °¨ Removing the skin from poultry. °¨ Removing all visible fats from meats. °¨ Skimming the fat off of stews, soups, and gravies before serving them. °¨ Steaming vegetables in water or broth. °· Lose weight if you are overweight. Losing just 5-10% of your initial body weight can help your overall health and prevent diseases such as diabetes and heart disease. °· Increase your consumption of nuts, legumes, and seeds to 4-5 servings per week. One serving of dried beans or legumes equals ½ cup after being cooked, one serving of nuts equals 1½ ounces, and one serving of seeds equals ½ ounce or 1 tablespoon. °· You may need to monitor your salt (sodium) intake, especially if you have high blood pressure. Talk with your health care provider or dietitian to get   more information about reducing sodium. °WHAT FOODS CAN I EAT? °Grains °Breads, including French, white, pita, wheat, raisin, rye, oatmeal, and Italian. Tortillas that are neither fried nor made with lard or trans fat. Low-fat rolls, including hotdog and hamburger buns and English muffins. Biscuits. Muffins. Waffles. Pancakes. Light popcorn. Whole-grain cereals. Flatbread. Melba  toast. Pretzels. Breadsticks. Rusks. Low-fat snacks and crackers, including oyster, saltine, matzo, graham, animal, and rye. Rice and pasta, including brown rice and those that are made with whole wheat. °Vegetables °All vegetables. °Fruits °All fruits, but limit coconut. °Meats and Other Protein Sources °Lean, well-trimmed beef, veal, pork, and lamb. Chicken and turkey without skin. All fish and shellfish. Wild duck, rabbit, pheasant, and venison. Egg whites or low-cholesterol egg substitutes. Dried beans, peas, lentils, and tofu. Seeds and most nuts. °Dairy °Low-fat or nonfat cheeses, including ricotta, string, and mozzarella. Skim or 1% milk that is liquid, powdered, or evaporated. Buttermilk that is made with low-fat milk. Nonfat or low-fat yogurt. °Beverages °Mineral water. Diet carbonated beverages. °Sweets and Desserts °Sherbets and fruit ices. Honey, jam, marmalade, jelly, and syrups. Meringues and gelatins. Pure sugar candy, such as hard candy, jelly beans, gumdrops, mints, marshmallows, and small amounts of dark chocolate. Angel food cake. °Eat all sweets and desserts in moderation. °Fats and Oils °Nonhydrogenated (trans-free) margarines. Vegetable oils, including soybean, sesame, sunflower, olive, peanut, safflower, corn, canola, and cottonseed. Salad dressings or mayonnaise that are made with a vegetable oil. Limit added fats and oils that you use for cooking, baking, salads, and as spreads. °Other °Cocoa powder. Coffee and tea. All seasonings and condiments. °The items listed above may not be a complete list of recommended foods or beverages. Contact your dietitian for more options. °WHAT FOODS ARE NOT RECOMMENDED? °Grains °Breads that are made with saturated or trans fats, oils, or whole milk. Croissants. Butter rolls. Cheese breads. Sweet rolls. Donuts. Buttered popcorn. Chow mein noodles. High-fat crackers, such as cheese or butter crackers. °Meats and Other Protein Sources °Fatty meats, such as  hotdogs, short ribs, sausage, spareribs, bacon, ribeye roast or steak, and mutton. High-fat deli meats, such as salami and bologna. Caviar. Domestic duck and goose. Organ meats, such as kidney, liver, sweetbreads, brains, gizzard, chitterlings, and heart. °Dairy °Cream, sour cream, cream cheese, and creamed cottage cheese. Whole milk cheeses, including blue (bleu), Monterey Jack, Brie, [place], American, [place], Swiss, cheddar, Camembert, and [place].  Whole or 2% milk that is liquid, evaporated, or condensed. Whole buttermilk. Cream sauce or high-fat cheese sauce. Yogurt that is made from whole milk. °Beverages °Regular sodas and drinks with added sugar. °Sweets and Desserts °Frosting. Pudding. Cookies. Cakes other than angel food cake. Candy that has milk chocolate or white chocolate, hydrogenated fat, butter, coconut, or unknown ingredients. Buttered syrups. Full-fat ice cream or ice cream drinks. °Fats and Oils °Gravy that has suet, meat fat, or shortening. Cocoa butter, hydrogenated oils, palm oil, coconut oil, palm kernel oil. These can often be found in baked products, candy, fried foods, nondairy creamers, and whipped toppings. Solid fats and shortenings, including bacon fat, salt pork, lard, and butter. Nondairy cream substitutes, such as coffee creamers and sour cream substitutes. Salad dressings that are made of unknown oils, cheese, or sour cream. °The items listed above may not be a complete list of foods and beverages to avoid. Contact your dietitian for more information. °  °This information is not intended to replace advice given to you by your health care provider. Make sure you discuss any questions you have with your health   care provider. °  °Document Released: 01/20/2008 Document Revised: 05/03/2014 Document Reviewed: 10/04/2013 °Elsevier Interactive Patient Education ©2016 [employer] ° °

## 2015-11-03 ENCOUNTER — Other Ambulatory Visit: Payer: BLUE CROSS/BLUE SHIELD

## 2015-11-03 ENCOUNTER — Other Ambulatory Visit: Payer: Self-pay

## 2015-11-03 DIAGNOSIS — I10 Essential (primary) hypertension: Secondary | ICD-10-CM

## 2015-11-03 DIAGNOSIS — E785 Hyperlipidemia, unspecified: Secondary | ICD-10-CM

## 2015-11-03 DIAGNOSIS — Z131 Encounter for screening for diabetes mellitus: Secondary | ICD-10-CM | POA: Diagnosis not present

## 2015-11-03 DIAGNOSIS — Z1329 Encounter for screening for other suspected endocrine disorder: Secondary | ICD-10-CM | POA: Diagnosis not present

## 2015-11-03 DIAGNOSIS — Z1321 Encounter for screening for nutritional disorder: Secondary | ICD-10-CM

## 2015-11-04 ENCOUNTER — Encounter: Payer: Self-pay | Admitting: Family Medicine

## 2015-11-04 DIAGNOSIS — M545 Low back pain, unspecified: Secondary | ICD-10-CM | POA: Insufficient documentation

## 2015-11-04 DIAGNOSIS — Z789 Other specified health status: Secondary | ICD-10-CM

## 2015-11-04 DIAGNOSIS — G5603 Carpal tunnel syndrome, bilateral upper limbs: Secondary | ICD-10-CM | POA: Insufficient documentation

## 2015-11-04 DIAGNOSIS — Z720 Tobacco use: Secondary | ICD-10-CM | POA: Insufficient documentation

## 2015-11-04 DIAGNOSIS — Z9889 Other specified postprocedural states: Secondary | ICD-10-CM | POA: Insufficient documentation

## 2015-11-04 DIAGNOSIS — M179 Osteoarthritis of knee, unspecified: Secondary | ICD-10-CM | POA: Insufficient documentation

## 2015-11-04 DIAGNOSIS — M1710 Unilateral primary osteoarthritis, unspecified knee: Secondary | ICD-10-CM | POA: Insufficient documentation

## 2015-11-04 DIAGNOSIS — Z7901 Long term (current) use of anticoagulants: Secondary | ICD-10-CM | POA: Insufficient documentation

## 2015-11-04 DIAGNOSIS — Z87891 Personal history of nicotine dependence: Secondary | ICD-10-CM | POA: Insufficient documentation

## 2015-11-04 LAB — COMPLETE METABOLIC PANEL WITH GFR
ALT: 20 U/L (ref 9–46)
AST: 16 U/L (ref 10–35)
Albumin: 4.1 g/dL (ref 3.6–5.1)
Alkaline Phosphatase: 48 U/L (ref 40–115)
BILIRUBIN TOTAL: 0.5 mg/dL (ref 0.2–1.2)
BUN: 16 mg/dL (ref 7–25)
CHLORIDE: 106 mmol/L (ref 98–110)
CO2: 23 mmol/L (ref 20–31)
CREATININE: 0.84 mg/dL (ref 0.70–1.33)
Calcium: 8.8 mg/dL (ref 8.6–10.3)
GFR, Est African American: >89 mL/min (ref >=60)
GFR, Est Non African American: >89 mL/min (ref >=60)
Glucose, Bld: 91 mg/dL (ref 65–99)
Potassium: 4.7 mmol/L (ref 3.5–5.3)
Sodium: 138 mmol/L (ref 135–146)
TOTAL PROTEIN: 6.2 g/dL (ref 6.1–8.1)

## 2015-11-04 LAB — CBC WITH DIFFERENTIAL/PLATELET
BASOS PCT: 1 %
Basophils Absolute: 72 cells/uL (ref 0–200)
EOS ABS: 144 {cells}/uL (ref 15–500)
Eosinophils Relative: 2 %
HEMATOCRIT: 44.2 % (ref 38.5–50.0)
Hemoglobin: 14.7 g/dL (ref 13.2–17.1)
LYMPHS PCT: 26 %
Lymphs Abs: 1872 cells/uL (ref 850–3900)
MCH: 30.6 pg (ref 27.0–33.0)
MCHC: 33.3 g/dL (ref 32.0–36.0)
MCV: 91.9 fL (ref 80.0–100.0)
MONO ABS: 504 {cells}/uL (ref 200–950)
MONOS PCT: 7 %
MPV: 10.2 fL (ref 7.5–12.5)
NEUTROS ABS: 4608 {cells}/uL (ref 1500–7800)
Neutrophils Relative %: 64 %
PLATELETS: 252 10*3/uL (ref 140–400)
RBC: 4.81 MIL/uL (ref 4.20–5.80)
RDW: 13.8 % (ref 11.0–15.0)
WBC: 7.2 10*3/uL (ref 3.8–10.8)

## 2015-11-04 LAB — VITAMIN D 25 HYDROXY (VIT D DEFICIENCY, FRACTURES): Vit D, 25-Hydroxy: 25 ng/mL — ABNORMAL LOW (ref 30–100)

## 2015-11-04 LAB — LIPID PANEL
CHOL/HDL RATIO: 2.2 ratio (ref <=5.0)
CHOLESTEROL: 123 mg/dL — AB (ref 125–200)
HDL: 57 mg/dL (ref >=40)
LDL Cholesterol: 46 mg/dL (ref <130)
Triglycerides: 98 mg/dL (ref <150)
VLDL: 20 mg/dL (ref <30)

## 2015-11-04 LAB — HEMOGLOBIN A1C
Hgb A1c MFr Bld: 5.7 % — ABNORMAL HIGH (ref <5.7)
Mean Plasma Glucose: 117 mg/dL

## 2015-11-04 LAB — TSH: TSH: 1.49 mIU/L (ref 0.40–4.50)

## 2015-11-04 NOTE — Progress Notes (Signed)
Gordon Hayes, D.O. Primary care at [place]:    Chief Complaint  Patient presents with  . Establish Care   New pt, here to establish care.   HPI: Gordon Hayes is a pleasant 55 y.o. male who presents to [place] at [facility] today "for a meet and greet" and to become established.   Patient married to his wife Gordon Hayes.  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  They have 3 children, one 18 year old, one 44 year old and they're 86 year old daughter who currently lives with them.   Colonoscopy 01/25/12.  They found polyps and its suppose to be repeated every 5 years.  Patient has a history of AMI and after 35 years of 3 packs per day of smoking, he quit on 03/31/2015 when he had his heart attack.   Patient has had 3 heart caths, 2 of them on 03/31/2015 and 1 on 04/02/2015.  The patient states that the stents kept Reoccluding.   Patient sees his cardiologist regularly.    He denies that he ever had a history of hypertension or hyperlipidemia but states he was put on the medicine post-heart attack by his cardiologist.   09/08/05: Had Sx: 1. Left L4-5 hemilaminectomy and left L4-5 diskectomy with microdissection  with decompression of the left L4-5 nerve roots. 2. Right L4 hemilaminotomy and right L4-5 diskectomy with microdissection  and decompression of the right L5 nerve root.   Past Medical History  Diagnosis Date  . Back pain, lumbosacral   . DJD (degenerative joint disease) of knee   . Carpal tunnel syndrome, bilateral   . COPD (chronic obstructive pulmonary disease) ([place]) 2013  . STEMI (ST elevation myocardial infarction) ([place])  03/31/2015    Subtotal OM2 - PCI with DES. EF 45-50%; Staged PCI of RCA  . CAD S/P percutaneous coronary angioplasty 03/2015    OM2 99>0% w/ 2.75 mm x 16 mm (3.2 mm) Synergy DES; OM2 branch 80%, med rx, pRCA 70%, mRCA  90% (s/p staged PCI w/ 2. 75 X 33 mm Xience Alpine DES);       Past Surgical History  Procedure Laterality Date  . Posterior laminectomy / decompression lumbar spine  2007  . Hernia repair      right inguinal  . Tonsillectomy    . Cardiac catheterization N/A 03/31/2015    Procedure: Left Heart Cath and Coronary Angiography;  Surgeon: Leonie Man, MD;  Location: [facility];  Service: Cardiovascular;  Subtotoal OM2, 85% RCA with diffuse upstream Dz. EF 45-50%  . Cardiac catheterization N/A 03/31/2015    Procedure: Coronary Stent Intervention;  Surgeon: Leonie Man, MD;  Location: [facility];  Service: Cardiovascular;  Laterality: N/A;  . Cardiac catheterization N/A 04/02/2015    Procedure: Coronary Stent Intervention;  Surgeon: Wellington Hampshire, MD;  Location: [facility];  Service: Cardiovascular;  Laterality: N/A;  . Transthoracic echocardiogram  06/2015    EF 55-60%, mild LVH. Normal wall motion. Gr 1 DD.       Family History  Problem Relation Age of Onset  . Hearing loss Mother   . Cancer Father 20    prostate, bladder  . Heart disease Father   . Stroke Father   . Cancer Maternal Grandfather   . Cancer Paternal Grandmother   . Heart disease Paternal Grandfather   . Colon  cancer Neg Hx   . Esophageal cancer Neg Hx   . Rectal cancer Neg Hx   . Stomach cancer Neg Hx   . Heart attack Father     2  . Hypertension Mother       History  Drug Use No  ,    History  Alcohol Use  . 0.0 - 2.4 oz/week  . 0-4 Cans of beer per week  ,    History  Smoking status  . Former Smoker -- 3.00 packs/day for 35 years  . Types: Cigarettes  . Quit date: 03/31/2015  Smokeless tobacco  . Former Systems developer    Comment: trying to-"you gotta have some will power.  I don't have my mind right to do it yet."  ,     History  Sexual Activity  . Sexual Activity:  . Partners: Female      Patient's Medications  New Prescriptions   No medications on file    Previous Medications   ASPIRIN 81 MG TABLET    Take 1 tablet (81 mg total) by mouth daily.   CARVEDILOL (COREG) 6.25 MG TABLET    Take 1 tablet (6.25 mg total) by mouth 2 (two) times daily with a meal.   NITROGLYCERIN (NITROSTAT) 0.4 MG SL TABLET    Place 1 tablet (0.4 mg total) under the tongue every 5 (five) minutes as needed for chest pain.   ROSUVASTATIN (CRESTOR) 10 MG TABLET    Take 1 tablet (10 mg total) by mouth daily.   TICAGRELOR (BRILINTA) 90 MG TABS TABLET    Take 1 tablet (90 mg total) by mouth 2 (two) times daily.  Modified Medications   No medications on file  Discontinued Medications   No medications on file     Penicillins and Atorvastatin Outpatient Encounter Prescriptions as of 10/30/2015  Medication Sig  . aspirin 81 MG tablet Take 1 tablet (81 mg total) by mouth daily.  . carvedilol (COREG) 6.25 MG tablet Take 1 tablet (6.25 mg total) by mouth 2 (two) times daily with a meal.  . nitroGLYCERIN (NITROSTAT) 0.4 MG SL tablet Place 1 tablet (0.4 mg total) under the tongue every 5 (five) minutes as needed for chest pain.  . rosuvastatin (CRESTOR) 10 MG tablet Take 1 tablet (10 mg total) by mouth daily.  . ticagrelor (BRILINTA) 90 MG TABS tablet Take 1 tablet (90 mg total) by mouth 2 (two) times daily.   No facility-administered encounter medications on file as of 10/30/2015.      There are no preventive care reminders to display for this patient.   Immunization History  Administered Date(s) Administered  . Influenza Split 02/01/2012  . Tdap 11/23/2011     <no information>   Fall Risk  10/30/2015  Falls in the past year? No     Depression screen PHQ 2/9 10/30/2015  Decreased Interest 0  Down, Depressed, Hopeless 0  PHQ - 2 Score 0      Review of Systems:   ( Completed via Adult Medical History Intake form today ) General:   Denies fever, chills, appetite changes, unexplained weight loss.  Optho/Auditory:   Denies visual changes, blurred vision/LOV,  ringing in ears/ diff hearing Respiratory:   Denies SOB, DOE, cough, wheezing.  Cardiovascular:   Denies chest pain, palpitations, new onset peripheral edema  Gastrointestinal:   Denies nausea, vomiting, diarrhea.  Genitourinary:    Denies dysuria, increased frequency, flank pain.  Endocrine:     Denies hot or cold intolerance, polyuria,  polydipsia. Musculoskeletal:  Denies unexplained myalgias, joint swelling, arthralgias, gait problems.  Skin:  Denies rash, suspicious lesions or new/ changes in moles Neurological:    Denies dizziness, syncope, unexplained weakness, lightheadedness, numbness  Psychiatric/Behavioral:   Denies mood changes, suicidal or homicidal ideations, hallucinations    Objective:   Blood pressure 120/77, pulse 76, height 5\' 10"  (1.778 m), weight 200 lb 8 oz (90.946 kg). Body mass index is 28.77 kg/(m^2).  General: Well Developed, well nourished, and in no acute distress.  Neuro: Alert and oriented x3, extra-ocular muscles intact, sensation grossly intact.  HEENT: Normocephalic, atraumatic, pupils equal round reactive to light, neck supple, no gross masses, no carotid bruits, no JVD apprec Skin: no gross suspicious lesions or rashes  Cardiac: Regular rate and rhythm, +S1, S2.  Respiratory: dec aeration but essentially clear to auscultation bilaterally. Not using accessory muscles, speaking in full sentences.  Abdominal: Soft, not grossly distended Musculoskeletal: Ambulates w/o diff, FROM * 4 ext.  Vasc: less 2 sec cap RF, warm and pink  Psych:  No HI/SI, judgement and insight good.    Impression and Recommendations:    The patient was counseled, risk factors were discussed, anticipatory guidance given.   CAD S/P percutaneous coronary angioplasty; understands importance of Cards f/up appts as well  ST elevation myocardial infarction (STEMI) of lateral wall:  Sx stable, no CP  Quit smoking within past year- 12/16 with SUBSTANTIAL Smoking hx 80+ pack yr  hx  Essential hypertension- well controlled, cont meds  Hyperlipidemia with target LDL less than 70; last checked, it was well controlled. We will reck near future  Snuff user- strongly encouraged to stop  Chronic anticoagulation- pt understands risks of meds from cards doc.  He will cont to get that from them  Chronic bronchitis, unspecified chronic bronchitis type- Pt denies current SOB, inc prod cough etc. Stable   Gross side effects, risk and benefits, and alternatives of medications discussed with patient.  Patient is aware that all medications have potential side effects and we are unable to predict every side effect or drug-drug interaction that may occur.  Expresses verbal understanding and consents to current therapy plan and treatment regimen.  Please see AVS handed out to patient at the end of our visit for further patient instructions/ counseling done pertaining to today's office visit.   Note: This document was prepared using Dragon voice recognition software and may include unintentional dictation errors.

## 2015-11-04 NOTE — Progress Notes (Signed)
Quick Note:  mychart note sent to pt as per below:  Dear Mr. Galiza,   It was wonderful to see you in our office recently!   I reviewed your lab work and most everything looks within acceptable ranges. However, there were some mild changes to your vitamin D levels and A1c that need our attention.   I recommend you take 5000 IUs of vitamin D3 daily and have [place] recheck your levels in 4-6 months. Your A1c remains elevated and consistent with prediabetes or glucose intolerance. As you are aware of, you have had these abnormal levels in the past and in your follow-up office visit with me, we should discuss lifestyle changes to help prevent the onset to full-blown diabetes.  Please follow up with me as we discussed during our last office visit together. Take care of yourself.   My best,   Dr Raliegh Scarlet ______

## 2015-11-08 ENCOUNTER — Other Ambulatory Visit: Payer: Self-pay | Admitting: Physician Assistant

## 2015-11-10 NOTE — Telephone Encounter (Signed)
Medication   atorvastatin (LIPITOR) 80 MG tablet [[id number]]       atorvastatin (LIPITOR) 80 MG tablet [id number] DISCONTINUED     Order Details    Dose: 40 mg Route: Oral Frequency: Daily-1800   Dispense Quantity:  30 tablet Refills:  11 Fills Remaining:  11          Sig: Take 0.5 tablets (40 mg total) by mouth daily at 6 PM.         Discontinue Date:  09/15/2015 [place] User:  Raiford Simmonds, RN Discontinue Reason:  Side effect (s)   Written Date:  07/28/15 Expiration Date:  07/27/16     Start Date:  07/28/15 End Date:  09/15/15     Ordering Provider:  Leonie Man, MD Authorizing Provider:  Leonie Man, MD Ordering User:  Raiford Simmonds, RN          Medication Notes      Raiford Simmonds, RN -- 09/15/15 8:59 AM      mylagia's on 80 mg and 40 mg                Original Order:  atorvastatin (LIPITOR) 80 MG tablet [id number]        Pharmacy:  [facility], [place] Comments:  --

## 2015-12-26 ENCOUNTER — Telehealth: Payer: Self-pay | Admitting: *Deleted

## 2015-12-26 NOTE — Telephone Encounter (Signed)
Will cancel test -lipid and cmp Patient had  labs drawn in July 2017 by prrimary

## 2015-12-26 NOTE — Telephone Encounter (Signed)
-----   Message from Raiford Simmonds, RN sent at 07/28/2015  9:56 AM EDT ----- Labs --lipid ,cmp due 11/27/15 Mail in July 10

## 2016-01-26 ENCOUNTER — Encounter: Payer: Self-pay | Admitting: Cardiology

## 2016-01-26 ENCOUNTER — Ambulatory Visit (INDEPENDENT_AMBULATORY_CARE_PROVIDER_SITE_OTHER): Payer: BLUE CROSS/BLUE SHIELD | Admitting: Cardiology

## 2016-01-26 VITALS — BP 128/84 | HR 74 | Ht 70.0 in | Wt 190.8 lb

## 2016-01-26 DIAGNOSIS — I2510 Atherosclerotic heart disease of native coronary artery without angina pectoris: Secondary | ICD-10-CM

## 2016-01-26 DIAGNOSIS — Z87891 Personal history of nicotine dependence: Secondary | ICD-10-CM

## 2016-01-26 DIAGNOSIS — Z9861 Coronary angioplasty status: Secondary | ICD-10-CM

## 2016-01-26 DIAGNOSIS — I25110 Atherosclerotic heart disease of native coronary artery with unstable angina pectoris: Secondary | ICD-10-CM | POA: Diagnosis not present

## 2016-01-26 DIAGNOSIS — I2129 ST elevation (STEMI) myocardial infarction involving other sites: Secondary | ICD-10-CM

## 2016-01-26 DIAGNOSIS — E785 Hyperlipidemia, unspecified: Secondary | ICD-10-CM

## 2016-01-26 DIAGNOSIS — I10 Essential (primary) hypertension: Secondary | ICD-10-CM

## 2016-01-26 NOTE — Assessment & Plan Note (Signed)
Continues to have excellent control. Successfully converted from Lipitor to Crestor. Tolerating well. Would probably not need another check until next July.

## 2016-01-26 NOTE — Assessment & Plan Note (Signed)
The existing 80% lesion in the small branch of OM being treated medically. No anginal symptoms. On dual antiplatelet therapy with Brilinta and aspirin. Okay to aspirin in December. On beta blocker and statin.

## 2016-01-26 NOTE — Assessment & Plan Note (Signed)
Well-controlled on current dose of carvedilol. °

## 2016-01-26 NOTE — Patient Instructions (Addendum)
NO CHANGE WITH CURRENT MEDICATIONS EXCEPT MAY STOP TAKING ASPIRIN IN [facility] 2017   Your physician wants you to follow-up [id number] -May 2018 with Dr Ellyn Hack.You will receive a reminder letter in the mail two months in advance. If you don't receive a letter, please call our office to schedule the follow-up appointment.   If you need a refill on your cardiac medications before your next appointment, please call your pharmacy.

## 2016-01-26 NOTE — Assessment & Plan Note (Signed)
Status post 2 vessel PCI with DES stents. Remains on aspirin and Brilinta. No further anginal symptoms.  Okay to stop aspirin after one year which would be December. Is on statin and beta blocker.

## 2016-01-26 NOTE — Assessment & Plan Note (Signed)
Successfully quit,  now trying to get his brother to quit. I congratulated him on his efforts.

## 2016-01-26 NOTE — Progress Notes (Signed)
PCP: Mellody Dance, DO - new for him  Clinic Note: Chief Complaint  Patient presents with  . Follow-up    CAD- PCI for STEMI    HPI: Gordon Hayes is a 55 y.o. male with a PMH below who presents today for 6 month f/u for CAD-STEMI-PCI.Marland Kitchen  Gordon Hayes was last seen in April 2017.  Was doing pretty well with exception of diffuse muscle aching. He had successfully quit smoking. Remain physically active.  Quit drinking soft drinks --> converted to Crestor 10 mg.  Eating better.   Recent Hospitalizations: None  Studies Reviewed:   Interval History: He presents today really doing well.  He has not had any more muscle cramps and switching to Crestor. He stopped eating candies and other things to keep him from smoking. He is able to be around people who were smoking without any issues. On the go 7 d /week - lots of physical activity.  He denies any recurrent symptoms suggestive of his angina with either rest or exertion. He denies any PND, orthopnea or edema. No exertional dyspnea. No heart failure symptoms of PND, orthopnea or edema.  No palpitations, lightheadedness, dizziness, weakness or syncope/near syncope. No TIA/amaurosis fugax symptoms. No claudication.  ROS: A comprehensive was performed. Review of Systems  Constitutional: Negative for malaise/fatigue and weight loss (Despite trying to eat, he has not been on exercise because of his myalgias and arthralgias).  HENT: Negative for nosebleeds.   Eyes: Negative for blurred vision.  Respiratory: Negative for cough and shortness of breath.   Cardiovascular: Negative for claudication.  Gastrointestinal: Negative for blood in stool and constipation.  Genitourinary: Negative for hematuria.  Musculoskeletal: Positive for joint pain and myalgias (Resolved since changing to Crestor).  Neurological: Negative for dizziness and headaches.  Endo/Heme/Allergies: Positive for environmental allergies (this weekend - coughng some).  Bruises/bleeds easily.  Psychiatric/Behavioral: Negative for depression and memory loss. The patient is not nervous/anxious and does not have insomnia.   All other systems reviewed and are negative.   Past Medical History:  Diagnosis Date  . Back pain, lumbosacral   . CAD S/P percutaneous coronary angioplasty 03/2015   OM2 99>0% w/ 2.75 mm x 16 mm (3.2 mm) Synergy DES; inferior OM2 branch 80%, med rx, pRCA 70%, mRCA 90% (s/p staged PCI w/ 2. 75 X 33 mm Xience Alpine DES);   Marland Kitchen Carpal tunnel syndrome, bilateral   . COPD (chronic obstructive pulmonary disease) ([place]) 2013  . DJD (degenerative joint disease) of knee   . STEMI (ST elevation myocardial infarction) ([place])  03/31/2015   Subtotal OM2 - PCI with DES. EF 45-50%; Staged PCI of RCA    Past Surgical History:  Procedure Laterality Date  . CARDIAC CATHETERIZATION N/A 03/31/2015   Procedure: Left Heart Cath and Coronary Angiography;  Surgeon: Leonie Man, MD;  Location: [facility];  Service: Cardiovascular;  Subtotoal OM2, 85% RCA with diffuse upstream Dz. EF 45-50%  . CARDIAC CATHETERIZATION N/A 03/31/2015   Procedure: Coronary Stent Intervention;  Surgeon: Leonie Man, MD;  Location: [facility];  Service: Cardiovascular;  Laterality: N/A;  . CARDIAC CATHETERIZATION N/A 04/02/2015   Procedure: Coronary Stent Intervention;  Surgeon: Wellington Hampshire, MD;  Location: [facility];  Service: Cardiovascular;  Laterality: N/A;  . HERNIA REPAIR     right inguinal  . POSTERIOR LAMINECTOMY / DECOMPRESSION LUMBAR SPINE  2007  . TONSILLECTOMY    . TRANSTHORACIC ECHOCARDIOGRAM  06/2015   EF  55-60%, mild LVH. Normal wall motion. Gr 1 DD.      Prior to Admission medications   Medication Sig Start Date End Date Taking? Authorizing Provider  aspirin 81 MG tablet Take 1 tablet (81 mg total) by mouth daily. 04/03/15  Yes Rhonda G Barrett, PA-C  carvedilol (COREG) 6.25 MG tablet Take 1 tablet (6.25 mg total) by mouth 2 (two)  times daily with a meal. 04/14/15  Yes Rhonda G Barrett, PA-C  nitroGLYCERIN (NITROSTAT) 0.4 MG SL tablet Place 1 tablet (0.4 mg total) under the tongue every 5 (five) minutes as needed for chest pain. 04/03/15  Yes Rhonda G Barrett, PA-C  rosuvastatin (CRESTOR) 10 MG tablet Take 1 tablet (10 mg total) by mouth daily. 09/15/15  Yes Leonie Man, MD  ticagrelor (BRILINTA) 90 MG TABS tablet Take 1 tablet (90 mg total) by mouth 2 (two) times daily. 04/14/15  Yes Rhonda G Barrett, PA-C    Allergies  Allergen Reactions  . Penicillins Anaphylaxis  . Atorvastatin Other (See Comments)    myalgias    Social History   Social History  . Marital status: Married    Spouse name: [name]  . Number of children: 3  . Years of education: 12   Occupational History  . [place] History Main Topics  . Smoking status: Former Smoker    Packs/day: 3.00    Years: 35.00    Types: Cigarettes    Quit date: 03/31/2015  . Smokeless tobacco: Former Systems developer     Comment: trying to-"you gotta have some will power.  I don't have my mind right to do it yet."  . Alcohol use 0.0 - 2.4 oz/week  . Drug use: No  . Sexual activity: Yes    Partners: Female   Other Topics Concern  . None   Social History Narrative   Denied application for additional life insurance policy due to recent diagnosis of COPD.   Family History  Problem Relation Age of Onset  . Hearing loss Mother   . Hypertension Mother   . Cancer Father 76    prostate, bladder  . Heart disease Father   . Stroke Father   . Heart attack Father     2  . Cancer Maternal Grandfather   . Cancer Paternal Grandmother   . Heart disease Paternal Grandfather   . Colon cancer Neg Hx   . Esophageal cancer Neg Hx   . Rectal cancer Neg Hx   . Stomach cancer Neg Hx     Wt Readings from Last 3 Encounters:  01/26/16 86.5 kg (190 lb 12.8 oz)  10/30/15 90.9 kg (200 lb 8 oz)  07/28/15 96.6 kg (213 lb)  Dietary  modification.  PHYSICAL EXAM  BP 128/84 (BP Location: Right Arm, Patient Position: Sitting, Cuff Size: Normal)   Pulse 74   Ht 5\' 10"  (1.778 m)   Wt 86.5 kg (190 lb 12.8 oz)   BMI 27.38 kg/m  General appearance: alert, cooperative, appears stated age, no distress and not really obese - more muscular Neck: no adenopathy, no carotid bruit and no JVD Lungs: clear to auscultation bilaterally, normal percussion bilaterally and non-labored Heart: regular rate and rhythm, S1, S2 normal, no murmur, click, rub or gallop; non-displaced PMI Abdomen: soft, non-tender; bowel sounds normal; no masses,  no organomegaly; no HJR Extremities: extremities normal, atraumatic, no cyanosis, or edema Pulses: 2+ and symmetric Skin: mobility and turgor normal  Neurologic: Mental status: Alert,  oriented, thought content appropriate Cranial nerves: normal (II-XII grossly intact)    Adult ECG Report  Rate: 74 ;  Rhythm: normal sinus rhythm and Borderline incomplete right bundle branch block. Otherwise normal axis, intervals and durations.;   Narrative Interpretation: Stable, relatively normal EKG  Other studies Reviewed: Additional studies/ records that were reviewed today include:  Recent Labs:   Lab Results  Component Value Date   CHOL 123 (L) 11/03/2015   HDL 57 11/03/2015   LDLCALC 46 11/03/2015   TRIG 98 11/03/2015   CHOLHDL 2.2 11/03/2015    ASSESSMENT / PLAN: Problem List Items Addressed This Visit    ST elevation myocardial infarction (STEMI) of lateral wall, subsequent episode of care ([place]) (Chronic)   Quit smoking within past year- 12/16 with SUBSTANTIAL Smoking hx 80+ pack yr hx (Chronic)    Successfully quit,  now trying to get his brother to quit. I congratulated him on his efforts.      Relevant Orders   EKG 12-Lead   Hyperlipidemia with target LDL less than 70 (Chronic)    Continues to have excellent control. Successfully converted from Lipitor to Crestor. Tolerating well. Would  probably not need another check until next July.      Relevant Orders   EKG 12-Lead   Essential hypertension (Chronic)    Well-controlled on current dose of carvedilol.      Relevant Orders   EKG 12-Lead   CAD S/P percutaneous coronary angioplasty - Primary (Chronic)    Status post 2 vessel PCI with DES stents. Remains on aspirin and Brilinta. No further anginal symptoms.  Okay to stop aspirin after one year which would be December. Is on statin and beta blocker.      Relevant Orders   EKG 12-Lead   Atherosclerotic heart disease of native coronary artery with unstable angina pectoris (HCC) (Chronic)    The existing 80% lesion in the small branch of OM being treated medically. No anginal symptoms. On dual antiplatelet therapy with Brilinta and aspirin. Okay to aspirin in December. On beta blocker and statin.       Other Visit Diagnoses   None.     Current medicines are reviewed at length with the patient today. (+/- concerns) n/a The following changes have been made:  OK to stop ASA after December.   Studies Ordered: None  Orders Placed This Encounter  Procedures  . EKG 12-Lead    ROV November. Would labs checked in roughly 6-8 months   Glenetta Hew, M.D., M.S. Interventional Cardiologist   Pager # [phone] Phone # [phone] [address] [place], [place] [address]

## 2016-03-25 ENCOUNTER — Other Ambulatory Visit: Payer: Self-pay | Admitting: Physician Assistant

## 2016-03-25 ENCOUNTER — Other Ambulatory Visit: Payer: Self-pay | Admitting: Cardiology

## 2016-03-25 ENCOUNTER — Other Ambulatory Visit: Payer: Self-pay | Admitting: *Deleted

## 2016-03-25 MED ORDER — CARVEDILOL 6.25 MG PO TABS: 6.2500 mg | ORAL_TABLET | Freq: Two times a day (BID) | ORAL | 11 refills | Status: DC

## 2016-03-25 MED ORDER — ROSUVASTATIN CALCIUM 10 MG PO TABS: 10.0000 mg | ORAL_TABLET | Freq: Every day | ORAL | 11 refills | Status: DC

## 2016-03-25 MED ORDER — TICAGRELOR 90 MG PO TABS: 90.0000 mg | ORAL_TABLET | Freq: Two times a day (BID) | ORAL | 11 refills | Status: DC

## 2016-03-31 ENCOUNTER — Ambulatory Visit: Payer: BLUE CROSS/BLUE SHIELD | Admitting: Family Medicine

## 2016-07-29 ENCOUNTER — Ambulatory Visit: Payer: BLUE CROSS/BLUE SHIELD | Admitting: Cardiology

## 2016-08-03 ENCOUNTER — Encounter: Payer: Self-pay | Admitting: Cardiology

## 2016-08-03 ENCOUNTER — Ambulatory Visit (INDEPENDENT_AMBULATORY_CARE_PROVIDER_SITE_OTHER): Payer: BLUE CROSS/BLUE SHIELD | Admitting: Cardiology

## 2016-08-03 VITALS — BP 126/85 | HR 64 | Ht 70.0 in | Wt 196.6 lb

## 2016-08-03 DIAGNOSIS — Z9861 Coronary angioplasty status: Secondary | ICD-10-CM

## 2016-08-03 DIAGNOSIS — Z87891 Personal history of nicotine dependence: Secondary | ICD-10-CM | POA: Diagnosis not present

## 2016-08-03 DIAGNOSIS — I2129 ST elevation (STEMI) myocardial infarction involving other sites: Secondary | ICD-10-CM

## 2016-08-03 DIAGNOSIS — E785 Hyperlipidemia, unspecified: Secondary | ICD-10-CM

## 2016-08-03 DIAGNOSIS — I2510 Atherosclerotic heart disease of native coronary artery without angina pectoris: Secondary | ICD-10-CM

## 2016-08-03 DIAGNOSIS — I10 Essential (primary) hypertension: Secondary | ICD-10-CM

## 2016-08-03 MED ORDER — NITROGLYCERIN 0.4 MG SL SUBL: 0.4000 mg | SUBLINGUAL_TABLET | SUBLINGUAL | 3 refills | Status: DC | PRN

## 2016-08-03 NOTE — Progress Notes (Signed)
PCP: Mellody Dance, DO  Clinic Note: Chief Complaint  Patient presents with  . Follow-up    CAD-PCI    HPI: Gordon Hayes is a 56 y.o. male with a PMH below who presents today for Six-month follow-up for CAD-STEMI-PCI RCA & Cx-OM. CHAZZ PHILSON was last seen on 01/26/2016. He was doing relatively well. Notable for not having anymore cramps after switching to Crestor. He had cut back his candy. He was on the go, but not doing exercise. Denied any active cardiac symptoms of angina or heart failure.  Recent Hospitalizations: None  Studies Reviewed: None  Interval History: Dimitrios presents today in good spirits with no complaints.  Besides dealing with seasonal allergies, he is doing quite well. He is very active, always on the go. 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 He does quite a bit of strenuous activity. He says that his weight goes up and down depending on the weather because of how much is able to do. But he does work about 6-7 days a week.  From a cardiac standpoint, he is clearly asymptomatic without any anginal or heart failure symptoms. No claudication.  No further cramping on Crestor.  No arrhythmias.  Cardiac Review of Symptoms reviewed and edited:  No chest pain or shortness of breath with rest or exertion.   No PND, orthopnea or edema. No palpitations, lightheadedness, dizziness, weakness or syncope/near syncope.  No TIA/amaurosis fugax symptoms.  No melena, hematochezia, hematuria, or epstaxis.  No claudication.  ROS: A comprehensive was performed. Review of Systems  Constitutional: Negative for malaise/fatigue.  HENT: Positive for congestion and sinus pain.   Eyes: Negative for blurred vision.  Respiratory: Negative for cough, shortness of breath and wheezing.   Cardiovascular: Negative for claudication.  Gastrointestinal: Negative for blood in stool and melena.  Genitourinary: Negative for  hematuria.  Musculoskeletal:       No more leg cramping  Skin: Negative.   Neurological: Negative for dizziness, focal weakness, loss of consciousness and weakness.  Endo/Heme/Allergies: Positive for environmental allergies. Does not bruise/bleed easily.  Psychiatric/Behavioral: Negative for memory loss. The patient is not nervous/anxious and does not have insomnia.   All other systems reviewed and are negative.   Past Medical History:  Diagnosis Date  . Back pain, lumbosacral   . CAD S/P percutaneous coronary angioplasty 03/2015   OM2 99>0% w/ 2.75 mm x 16 mm (3.2 mm) Synergy DES; inferior OM2 branch 80%, med rx, pRCA 70%, mRCA 90% (s/p staged PCI w/ 2. 75 X 33 mm Xience Alpine DES);   Marland Kitchen Carpal tunnel syndrome, bilateral   . COPD (chronic obstructive pulmonary disease) ([place]) 2013  . DJD (degenerative joint disease) of knee   . STEMI (ST elevation myocardial infarction) ([place])  03/31/2015   Subtotal OM2 - PCI with DES. EF 45-50%; Staged PCI of RCA    Past Surgical History:  Procedure Laterality Date  . CARDIAC CATHETERIZATION N/A 03/31/2015   Procedure: Left Heart Cath and Coronary Angiography;  Surgeon: Leonie Man, MD;  Location: [facility];  Service: Cardiovascular;  Subtotoal OM2, 85% RCA with diffuse upstream Dz. EF 45-50%  . CARDIAC CATHETERIZATION N/A 03/31/2015   Procedure: Coronary Stent Intervention;  Surgeon: Leonie Man, MD;  Location: [facility];: OM2 99% - Synergy DES 2.75 mm x 16 mm (3.2 mm).   . CARDIAC CATHETERIZATION N/A 04/02/2015   Procedure: Coronary Stent Intervention;  Surgeon: Wellington Hampshire, MD;  Location: [facility]: STAGED PCI  of RCA  90% - Xience DES 2.75 mm x 33 mm  . HERNIA REPAIR     right inguinal  . POSTERIOR LAMINECTOMY / DECOMPRESSION LUMBAR SPINE  2007  . TONSILLECTOMY    . TRANSTHORACIC ECHOCARDIOGRAM  06/2015   EF 55-60%, mild LVH. Normal wall motion. Gr 1 DD.      Current Meds  Medication Sig  . carvedilol  (COREG) 6.25 MG tablet Take 1 tablet (6.25 mg total) by mouth 2 (two) times daily with a meal.  . nitroGLYCERIN (NITROSTAT) 0.4 MG SL tablet Place 1 tablet (0.4 mg total) under the tongue every 5 (five) minutes as needed for chest pain.  . rosuvastatin (CRESTOR) 10 MG tablet Take 1 tablet (10 mg total) by mouth daily.  . ticagrelor (BRILINTA) 90 MG TABS tablet Take 1 tablet (90 mg total) by mouth 2 (two) times daily.  . [DISCONTINUED] nitroGLYCERIN (NITROSTAT) 0.4 MG SL tablet Place 1 tablet (0.4 mg total) under the tongue every 5 (five) minutes as needed for chest pain.    Allergies  Allergen Reactions  . Penicillins Anaphylaxis  . Atorvastatin Other (See Comments)    myalgias    Social History   Social History  . Marital status: Married    Spouse name: [name]  . Number of children: 3  . Years of education: 12   Occupational History  . [place] History Main Topics  . Smoking status: Former Smoker    Packs/day: 3.00    Years: 35.00    Types: Cigarettes    Quit date: 03/31/2015  . Smokeless tobacco: Former Systems developer     Comment: trying to-"you gotta have some will power.  I don't have my mind right to do it yet."  . Alcohol use 0.0 - 2.4 oz/week  . Drug use: No  . Sexual activity: Yes    Partners: Female   Other Topics Concern  . None   Social History Narrative   Denied application for additional life insurance policy due to recent diagnosis of COPD.    Family History  family history includes Cancer in his maternal grandfather and paternal grandmother; Cancer (age of onset: 75) in his father; Hearing loss in his mother; Heart attack in his father; Heart disease in his father and paternal grandfather; Hypertension in his mother; Stroke in his father.  Wt Readings from Last 3 Encounters:  08/03/16 196 lb 9.6 oz (89.2 kg)  01/26/16 190 lb 12.8 oz (86.5 kg)  10/30/15 200 lb 8 oz (90.9 kg)    PHYSICAL EXAM BP 126/85   Pulse 64   Ht 5\' 10"   (1.778 m)   Wt 196 lb 9.6 oz (89.2 kg)   SpO2 97%   BMI 28.21 kg/m  General appearance: alert, cooperative, appears stated age, no distress and borderline obese.   Otherwise healthy-appearing HEENT: [place]/AT, EOMI, MMM, anicteric sclera Neck: no adenopathy, no carotid bruit and no JVD Lungs: clear to auscultation bilaterally, normal percussion bilaterally and non-labored Heart: regular rate and rhythm, S1& S2 normal, no murmur, click, rub or gallop; nondisplaced PMI Abdomen: soft, non-tender; bowel sounds normal; no masses,  no organomegaly; no HJR Extremities: extremities normal, atraumatic, no cyanosis, or edema  Pulses: 2+ and symmetric;  Skin: mobility and turgor normal, no edema, no evidence of bleeding or bruising and no lesions noted  Neurologic: Mental status: Alert, oriented, thought content appropriate  Adult ECG Report n/a  Other studies Reviewed: Additional studies/ records that were reviewed today include:  Recent Labs:  Alo checked by PCP Lab Results  Component Value Date   CHOL 123 (L) 11/03/2015   HDL 57 11/03/2015   LDLCALC 46 11/03/2015   TRIG 98 11/03/2015   CHOLHDL 2.2 11/03/2015     ASSESSMENT / PLAN: Problem List Items Addressed This Visit    CAD S/P percutaneous coronary angioplasty - Primary (Chronic)    2 vessel PCI with DES stents. On Brilinta with no bleeding issues or anginal symptoms. No longer taking aspirin. On statin and beta blocker.      Relevant Medications   nitroGLYCERIN (NITROSTAT) 0.4 MG SL tablet   Essential hypertension (Chronic)    Controlled on carvedilol alone. No change      Relevant Medications   nitroGLYCERIN (NITROSTAT) 0.4 MG SL tablet   Hyperlipidemia with target LDL less than 70 (Chronic)    His PCP apparently has been checking his labs. If not checked by July, we will order lipid panel. He is on Crestor at stable dose with well-controlled lipids back in July of last year.      Relevant Medications   nitroGLYCERIN  (NITROSTAT) 0.4 MG SL tablet   Quit smoking within past year- 12/16 with SUBSTANTIAL Smoking hx 80+ pack yr hx (Chronic)    He had a very long smoking history, and he does note significant improvement of his breathing since stopping the time his MI. He clearly understood the importance and I congratulated his efforts.      ST elevation myocardial infarction (STEMI) of lateral wall, subsequent episode of care ([place]) (Chronic)    Subtotally occluded circumflex-OM treated with DES PCI. Had relief of symptoms and then had staged PCI the RCA. Stable blood pressure on moderate dose carvedilol. On statin and Brilinta.  No further anginal or heart failure symptoms. Normal EF by echocardiogram.       Relevant Medications   nitroGLYCERIN (NITROSTAT) 0.4 MG SL tablet      Current medicines are reviewed at length with the patient today. (+/- concerns) n/a The following changes have been made: n/a  Patient Instructions  No change with current medication or treatment     Your physician wants you to follow-up in NOV. 2018 WITH Dr Ellyn Hack.  You will receive a reminder letter in the mail two months in advance. If you don't receive a letter, please call our office to schedule the follow-up appointment.    If you need a refill on your cardiac medications before your next appointment, please call your pharmacy.   Studies Ordered:   No orders of the defined types were placed in this encounter.     Glenetta Hew, M.D., M.S. Interventional Cardiologist   Pager # [phone] Phone # [phone] [address] [place], [place] [address]

## 2016-08-03 NOTE — Patient Instructions (Signed)
No change with current medication or treatment     Your physician wants you to follow-up in NOV. 2018 WITH Dr Ellyn Hack.  You will receive a reminder letter in the mail two months in advance. If you don't receive a letter, please call our office to schedule the follow-up appointment.    If you need a refill on your cardiac medications before your next appointment, please call your pharmacy.

## 2016-08-04 ENCOUNTER — Encounter: Payer: Self-pay | Admitting: Cardiology

## 2016-08-04 NOTE — Assessment & Plan Note (Signed)
2 vessel PCI with DES stents. On Brilinta with no bleeding issues or anginal symptoms. No longer taking aspirin. On statin and beta blocker.

## 2016-08-04 NOTE — Assessment & Plan Note (Signed)
He has existing 80% stenosis in the branch of the OM that was treated with a stent. Doing well with medical management. No further anginal symptoms. Continue current medication

## 2016-08-04 NOTE — Assessment & Plan Note (Signed)
He had a very long smoking history, and he does note significant improvement of his breathing since stopping the time his MI. He clearly understood the importance and I congratulated his efforts.

## 2016-08-04 NOTE — Assessment & Plan Note (Signed)
His PCP apparently has been checking his labs. If not checked by July, we will order lipid panel. He is on Crestor at stable dose with well-controlled lipids back in July of last year.

## 2016-08-04 NOTE — Assessment & Plan Note (Signed)
Controlled on carvedilol alone. No change

## 2016-08-04 NOTE — Assessment & Plan Note (Signed)
Subtotally occluded circumflex-OM treated with DES PCI. Had relief of symptoms and then had staged PCI the RCA. Stable blood pressure on moderate dose carvedilol. On statin and Brilinta.  No further anginal or heart failure symptoms. Normal EF by echocardiogram.

## 2017-03-02 ENCOUNTER — Ambulatory Visit: Payer: BLUE CROSS/BLUE SHIELD | Admitting: Cardiology

## 2017-03-02 ENCOUNTER — Encounter: Payer: Self-pay | Admitting: Cardiology

## 2017-03-02 VITALS — BP 112/76 | HR 60 | Ht 70.0 in | Wt 187.6 lb

## 2017-03-02 DIAGNOSIS — I25119 Atherosclerotic heart disease of native coronary artery with unspecified angina pectoris: Secondary | ICD-10-CM

## 2017-03-02 DIAGNOSIS — Z9861 Coronary angioplasty status: Secondary | ICD-10-CM | POA: Diagnosis not present

## 2017-03-02 DIAGNOSIS — I10 Essential (primary) hypertension: Secondary | ICD-10-CM

## 2017-03-02 DIAGNOSIS — Z87891 Personal history of nicotine dependence: Secondary | ICD-10-CM

## 2017-03-02 DIAGNOSIS — I2129 ST elevation (STEMI) myocardial infarction involving other sites: Secondary | ICD-10-CM

## 2017-03-02 DIAGNOSIS — I2510 Atherosclerotic heart disease of native coronary artery without angina pectoris: Secondary | ICD-10-CM

## 2017-03-02 DIAGNOSIS — E785 Hyperlipidemia, unspecified: Secondary | ICD-10-CM

## 2017-03-02 LAB — COMPREHENSIVE METABOLIC PANEL
A/G RATIO: 1.7 (ref 1.2–2.2)
ALK PHOS: 49 IU/L (ref 39–117)
ALT: 15 IU/L (ref 0–44)
AST: 14 IU/L (ref 0–40)
Albumin: 4 g/dL (ref 3.5–5.5)
BILIRUBIN TOTAL: 0.4 mg/dL (ref 0.0–1.2)
BUN/Creatinine Ratio: 16 (ref 9–20)
BUN: 13 mg/dL (ref 6–24)
CHLORIDE: 106 mmol/L (ref 96–106)
CO2: 22 mmol/L (ref 20–29)
Calcium: 9.1 mg/dL (ref 8.7–10.2)
Creatinine, Ser: 0.82 mg/dL (ref 0.76–1.27)
GFR calc non Af Amer: 100 mL/min/{1.73_m2} (ref >59)
GFR, EST AFRICAN AMERICAN: 115 mL/min/{1.73_m2} (ref >59)
Globulin, Total: 2.4 g/dL (ref 1.5–4.5)
Glucose: 99 mg/dL (ref 65–99)
POTASSIUM: 4.6 mmol/L (ref 3.5–5.2)
Sodium: 140 mmol/L (ref 134–144)
TOTAL PROTEIN: 6.4 g/dL (ref 6.0–8.5)

## 2017-03-02 LAB — LIPID PANEL
Chol/HDL Ratio: 2 ratio (ref 0.0–5.0)
Cholesterol, Total: 105 mg/dL (ref 100–199)
HDL: 53 mg/dL (ref >39)
LDL Calculated: 40 mg/dL (ref 0–99)
Triglycerides: 62 mg/dL (ref 0–149)
VLDL Cholesterol Cal: 12 mg/dL (ref 5–40)

## 2017-03-02 NOTE — Assessment & Plan Note (Signed)
He has had PCI to the Cx-OM and extensive stenting of the RCA.  Remains stable on Brilinta but no bleeding issues. Beta-blocker and statin.

## 2017-03-02 NOTE — Assessment & Plan Note (Signed)
His existing 80% stenosis in the small branch of the OM.  No anginal symptoms with medical management.  I suspect that either this vessel is closed or has had improved flow with the upstream revascularization. Continue to monitor.

## 2017-03-02 NOTE — Progress Notes (Signed)
PCP: Mellody Dance, DO  Clinic Note: Chief Complaint  Patient presents with  . Follow-up    No complaints  . Coronary Artery Disease    HPI: Gordon Hayes is a 56 y.o. male with a PMH below who presents today for 40-month f/u for CAD-PCI. CAD history --> STEMI-PCI RCA & Cx-OM Dec 2016.  Initially reduced EF improved to 55-60% by Echo in March 2017   Gordon Hayes was last seen on August 03, 2016 -was doing well at that time no major complaints.  Very active.  Noted seasonal allergies.  No further cramping on Crestor.  No change to medications  Recent Hospitalizations: None  Studies Personally Reviewed - (if available, images/films reviewed: From Epic Chart or Care Everywhere)  None  Interval History: Gordon Hayes returns today doing very well.  He has no major complaints from a cardiac standpoint.  He is very active as usual, but with no regular exercise.  He has had no further episodes of chest tightness or pressure with rest or exertion.  No resting or exertional dyspnea.  No heart failure symptoms of PND, orthopnea or edema. No palpitations, lightheadedness, dizziness, weakness or syncope/near syncope. No TIA/amaurosis fugax symptoms. No melena, hematochezia, hematuria, or epstaxis. No claudication.  No further cramping with Crestor  He is done very well with smoking cessation, has not had a cigarette since leaving the hospital from his MI.  He feels much better and less much less short of breath than he had been.  No further coughing.  Tells me he feels the best he has felt in years.  ROS: A comprehensive was performed. Review of Systems  Constitutional: Negative for malaise/fatigue.  HENT: Negative for congestion and sinus pain.   Respiratory: Negative for cough and wheezing.   Musculoskeletal: Negative for joint pain.  Endo/Heme/Allergies: Negative for environmental allergies. Does not bruise/bleed easily.  Psychiatric/Behavioral: Negative for depression. The patient is not  nervous/anxious.   All other systems reviewed and are negative.  I have reviewed and (if needed) personally updated the patient's problem list, medications, allergies, past medical and surgical history, social and family history.   Past Medical History:  Diagnosis Date  . Back pain, lumbosacral   . CAD S/P percutaneous coronary angioplasty 03/2015   OM2 99>0% w/ 2.75 mm x 16 mm (3.2 mm) Synergy DES; inferior OM2 branch 80%, med rx, pRCA 70%, mRCA 90% (s/p staged PCI w/ 2. 75 X 33 mm Xience Alpine DES);   Marland Kitchen Carpal tunnel syndrome, bilateral   . COPD (chronic obstructive pulmonary disease) ([place]) 2013  . DJD (degenerative joint disease) of knee   . STEMI (ST elevation myocardial infarction) ([place])  03/31/2015   Subtotal OM2 - PCI with DES. EF 45-50%; Staged PCI of RCA     Past Surgical History:  Procedure Laterality Date  . HERNIA REPAIR     right inguinal  . POSTERIOR LAMINECTOMY / DECOMPRESSION LUMBAR SPINE  2007  . TONSILLECTOMY    . TRANSTHORACIC ECHOCARDIOGRAM  06/2015   EF 55-60%, mild LVH. Normal wall motion. Gr 1 DD.     Current Meds  Medication Sig  . carvedilol (COREG) 6.25 MG tablet Take 1 tablet (6.25 mg total) by mouth 2 (two) times daily with a meal.  . nitroGLYCERIN (NITROSTAT) 0.4 MG SL tablet Place 1 tablet (0.4 mg total) under the tongue every 5 (five) minutes as needed for chest pain.  . rosuvastatin (CRESTOR) 10 MG tablet Take 1 tablet (10 mg total) by mouth daily.  Marland Kitchen  ticagrelor (BRILINTA) 90 MG TABS tablet Take 1 tablet (90 mg total) by mouth 2 (two) times daily.    Allergies  Allergen Reactions  . Penicillins Anaphylaxis  . Atorvastatin Other (See Comments)    myalgias    Social History   Socioeconomic History  . Marital status: Married    Spouse name: [name]  . Number of children: 3  . Years of education: 21  . Highest education level: None  Social Needs  . Financial resource strain: None  . Food insecurity - worry: None  . Food insecurity -  inability: None  . Transportation needs - medical: None  . Transportation needs - non-medical: None  Occupational History  . Occupation: Dealer    Comment: [employer]  Tobacco Use  . Smoking status: Former Smoker    Packs/day: 3.00    Years: 35.00    Pack years: 105.00    Types: Cigarettes    Last attempt to quit: 03/31/2015    Years since quitting: 1.9  . Smokeless tobacco: Former Systems developer  . Tobacco comment: trying to-"you gotta have some will power.  I don't have my mind right to do it yet."  Substance and Sexual Activity  . Alcohol use: Yes    Alcohol/week: 0.0 - 2.4 oz  . Drug use: No  . Sexual activity: Yes    Partners: Female  Other Topics Concern  . None  Social History Narrative   Denied application for additional life insurance policy due to recent diagnosis of COPD.    family history includes Cancer in his maternal grandfather and paternal grandmother; Cancer (age of onset: 74) in his father; Hearing loss in his mother; Heart attack in his father; Heart disease in his father and paternal grandfather; Hypertension in his mother; Stroke in his father.  Wt Readings from Last 3 Encounters:  03/02/17 187 lb 9.6 oz (85.1 kg)  08/03/16 196 lb 9.6 oz (89.2 kg)  01/26/16 190 lb 12.8 oz (86.5 kg)    PHYSICAL EXAM BP 112/76   Pulse 60   Ht 5\' 10"  (1.778 m)   Wt 187 lb 9.6 oz (85.1 kg)   BMI 26.92 kg/m  Physical Exam  Constitutional: He is oriented to person, place, and time. He appears well-developed and well-nourished. No distress.  Healthy-appearing.  Well-nourished.  HENT:  Head: Normocephalic and atraumatic.  Eyes: EOM are normal. No scleral icterus.  Neck: Normal range of motion. Neck supple. No hepatojugular reflux and no JVD present. Carotid bruit is not present.  Cardiovascular: Normal rate, regular rhythm and normal pulses.  No extrasystoles are present. PMI is not displaced. Exam reveals no gallop and no friction rub.  No murmur heard. Pulmonary/Chest:  Effort normal and breath sounds normal. No respiratory distress. He has no wheezes. He has no rales.  Abdominal: Soft. Bowel sounds are normal. He exhibits no distension. There is no tenderness.  Musculoskeletal: Normal range of motion. He exhibits no edema.  Neurological: He is alert and oriented to person, place, and time.  Skin: Skin is warm and dry.  Psychiatric: He has a normal mood and affect. His behavior is normal. Judgment and thought content normal.  Nursing note and vitals reviewed.   Adult ECG Report  Rate: 60 ;  Rhythm: normal sinus rhythm and Incomplete right bundle branch block.;  Normal axis, intervals and durations  Narrative Interpretation: Stable EKG   Other studies Reviewed: Additional studies/ records that were reviewed today include:  Recent Labs:    Lab Results  Component Value Date   CHOL 123 (L) 11/03/2015   HDL 57 11/03/2015   LDLCALC 46 11/03/2015   TRIG 98 11/03/2015   CHOLHDL 2.2 11/03/2015     ASSESSMENT / PLAN: Problem List Items Addressed This Visit    Atherosclerotic heart disease of native coronary artery with angina pectoris ([place]) (Chronic)    His existing 80% stenosis in the small branch of the OM.  No anginal symptoms with medical management.  I suspect that either this vessel is closed or has had improved flow with the upstream revascularization. Continue to monitor.      Relevant Orders   EKG 12-Lead   Comprehensive metabolic panel   CAD S/P percutaneous coronary angioplasty - Primary (Chronic)    He has had PCI to the Cx-OM and extensive stenting of the RCA.  Remains stable on Brilinta but no bleeding issues. Beta-blocker and statin.      Relevant Orders   EKG 12-Lead   Comprehensive metabolic panel   Essential hypertension (Chronic)    Excellent control with carvedilol alone.  No change.      Hyperlipidemia with target LDL less than 70 (Chronic)    He has not eaten today.  We will go ahead and check labs again.  Is stable.  I  do not think we need to have them checked until next year.  Last evaluation I have was from July 2017 -excellent control.  For now continue Crestor 10 mg daily.  Recheck labs today      Relevant Orders   Lipid panel   Comprehensive metabolic panel   Quit smoking (Chronic)    Feels great without further smoking.  No longer having the coughing and wheezing symptoms.      ST elevation myocardial infarction (STEMI) of lateral wall, subsequent episode of care ([place]) (Chronic)    Pretty much fully recovered.  His culprit lesion was a circumflex-OM lesion with subtotal occlusion.  Also had staged PCI the RCA.  Normal EF by echo.  No anginal or heart failure symptoms.         Current medicines are reviewed at length with the patient today. (+/- concerns) none The following changes have been made: none  Patient Instructions  NO CHANGES IN CURRENT MEDICATIONS   LABS TODAY -- [facility] LIPID CMP   Your physician wants you to follow-up in [place].You will receive a reminder letter in the mail two months in advance. If you don't receive a letter, please call our office to schedule the follow-up appointment.    If you need a refill on your cardiac medications before your next appointment, please call your pharmacy.    Studies Ordered:   Orders Placed This Encounter  Procedures  . Lipid panel  . Comprehensive metabolic panel  . EKG 12-Lead      Glenetta Hew, M.D., M.S. Interventional Cardiologist   Pager # [phone] Phone # [phone] [address] [place], [place] [address]

## 2017-03-02 NOTE — Assessment & Plan Note (Signed)
Pretty much fully recovered.  His culprit lesion was a circumflex-OM lesion with subtotal occlusion.  Also had staged PCI the RCA.  Normal EF by echo.  No anginal or heart failure symptoms.

## 2017-03-02 NOTE — Assessment & Plan Note (Signed)
Excellent control with carvedilol alone.  No change.

## 2017-03-02 NOTE — Assessment & Plan Note (Signed)
Feels great without further smoking.  No longer having the coughing and wheezing symptoms.

## 2017-03-02 NOTE — Assessment & Plan Note (Signed)
He has not eaten today.  We will go ahead and check labs again.  Is stable.  I do not think we need to have them checked until next year.  Last evaluation I have was from July 2017 -excellent control.  For now continue Crestor 10 mg daily.  Recheck labs today

## 2017-03-02 NOTE — Patient Instructions (Signed)
NO CHANGES IN CURRENT MEDICATIONS   LABS TODAY -- [facility] LIPID CMP   Your physician wants you to follow-up in [place].You will receive a reminder letter in the mail two months in advance. If you don't receive a letter, please call our office to schedule the follow-up appointment.    If you need a refill on your cardiac medications before your next appointment, please call your pharmacy.

## 2017-03-29 ENCOUNTER — Other Ambulatory Visit: Payer: Self-pay | Admitting: Cardiology

## 2017-03-29 NOTE — Telephone Encounter (Signed)
Rx has been sent to the pharmacy electronically. ° °

## 2018-03-02 ENCOUNTER — Ambulatory Visit: Payer: BLUE CROSS/BLUE SHIELD | Admitting: Cardiology

## 2018-03-02 ENCOUNTER — Encounter: Payer: Self-pay | Admitting: Cardiology

## 2018-03-02 VITALS — BP 94/68 | HR 55 | Ht 70.0 in | Wt 185.0 lb

## 2018-03-02 DIAGNOSIS — I10 Essential (primary) hypertension: Secondary | ICD-10-CM | POA: Diagnosis not present

## 2018-03-02 DIAGNOSIS — Z9861 Coronary angioplasty status: Secondary | ICD-10-CM

## 2018-03-02 DIAGNOSIS — I2510 Atherosclerotic heart disease of native coronary artery without angina pectoris: Secondary | ICD-10-CM | POA: Diagnosis not present

## 2018-03-02 DIAGNOSIS — E785 Hyperlipidemia, unspecified: Secondary | ICD-10-CM | POA: Diagnosis not present

## 2018-03-02 DIAGNOSIS — Z87891 Personal history of nicotine dependence: Secondary | ICD-10-CM

## 2018-03-02 DIAGNOSIS — I25119 Atherosclerotic heart disease of native coronary artery with unspecified angina pectoris: Secondary | ICD-10-CM | POA: Diagnosis not present

## 2018-03-02 LAB — COMPREHENSIVE METABOLIC PANEL
A/G RATIO: 2.2 (ref 1.2–2.2)
ALT: 21 IU/L (ref 0–44)
AST: 20 IU/L (ref 0–40)
Albumin: 4.4 g/dL (ref 3.5–5.5)
Alkaline Phosphatase: 52 IU/L (ref 39–117)
BUN/Creatinine Ratio: 16 (ref 9–20)
BUN: 13 mg/dL (ref 6–24)
Bilirubin Total: 0.4 mg/dL (ref 0.0–1.2)
CALCIUM: 9.2 mg/dL (ref 8.7–10.2)
CHLORIDE: 105 mmol/L (ref 96–106)
CO2: 21 mmol/L (ref 20–29)
Creatinine, Ser: 0.82 mg/dL (ref 0.76–1.27)
GFR, EST AFRICAN AMERICAN: 114 mL/min/{1.73_m2} (ref >59)
GFR, EST NON AFRICAN AMERICAN: 99 mL/min/{1.73_m2} (ref >59)
GLOBULIN, TOTAL: 2 g/dL (ref 1.5–4.5)
Glucose: 81 mg/dL (ref 65–99)
POTASSIUM: 4.7 mmol/L (ref 3.5–5.2)
SODIUM: 142 mmol/L (ref 134–144)
TOTAL PROTEIN: 6.4 g/dL (ref 6.0–8.5)

## 2018-03-02 LAB — LIPID PANEL
CHOL/HDL RATIO: 2 ratio (ref 0.0–5.0)
Cholesterol, Total: 106 mg/dL (ref 100–199)
HDL: 52 mg/dL (ref >39)
LDL CALC: 43 mg/dL (ref 0–99)
TRIGLYCERIDES: 53 mg/dL (ref 0–149)
VLDL Cholesterol Cal: 11 mg/dL (ref 5–40)

## 2018-03-02 MED ORDER — TICAGRELOR 60 MG PO TABS: 60.0000 mg | ORAL_TABLET | Freq: Two times a day (BID) | ORAL | 11 refills | Status: DC

## 2018-03-02 NOTE — Progress Notes (Signed)
PCP: Mellody Dance, DO  Clinic Note: Chief Complaint  Patient presents with  . Follow-up    Feels great  . Coronary Artery Disease    No angina    HPI: Gordon Hayes is a 57 y.o. male with a PMH of CAD and PCI documented below who presents today for annual follow-up.   CAD history --> STEMI-PCI RCA & Cx-OM Dec 2016.  Initially reduced EF improved to 55-60% by Echo in March 2017   Gordon Hayes was last seen on March 02, 2017.  Doing well with no major complaints.  No further angina or dyspnea.  Active, no regular exercise.  Is doing well with smoking cessation -noticing much less dyspnea or cough.  Recent Hospitalizations: None  Studies Personally Reviewed - (if available, images/films reviewed: From Epic Chart or Care Everywhere)  None  Interval History: Gordon Hayes returns for annual follow-up doing well with no major complaints.  He says his blood pressure may be a little low today because he took his medicine and had not yet eaten breakfast or had a couple coffee.  Usually his routine is he takes his medicine after breakfast and when that he does not have any low blood pressure for the most part his systolic blood pressures ranged 110-120 mmHg.  He has not had any further anginal symptoms with rest or exertion.  No heart failure symptoms of PND, orthopnea or edema.  No rapid irregular heartbeats palpitations.  No syncope/near syncope or TIA/amorous fugax.  No myalgias or arthralgias since converting statin to Crestor.. He has had some mild bruising from being on Brilinta, but otherwise no complaints. Still doing well with smoking cessation counseling.  Definitely notes that he is morning cough has all but gone away. For 2 years going now, he tells me he feels the best he has felt in years.  He is still very active with work, but does not have time to exercise that much.  He is lost about 10 pounds in a year and a half with increased activity and change in diet.  ROS: A  comprehensive was performed. Review of Systems  Constitutional: Negative for malaise/fatigue.  HENT: Negative for congestion and sinus pain.   Respiratory: Negative for cough and wheezing.   Gastrointestinal: Negative for blood in stool, heartburn and melena.  Genitourinary: Negative for hematuria.  Musculoskeletal: Positive for joint pain (knees -from getting up and down on his knees during work.).       Legs ache at the end of a long day on concrete.  Neurological: Negative for dizziness.  Endo/Heme/Allergies: Negative for environmental allergies. Does not bruise/bleed easily.  Psychiatric/Behavioral: Negative for depression. The patient is not nervous/anxious.   All other systems reviewed and are negative.  I have reviewed and (if needed) personally updated the patient's problem list, medications, allergies, past medical and surgical history, social and family history.   Past Medical History:  Diagnosis Date  . Back pain, lumbosacral   . CAD S/P percutaneous coronary angioplasty 03/2015   OM2 99>0% w/ 2.75 mm x 16 mm (3.2 mm) Synergy DES; inferior OM2 branch 80%, med rx, pRCA 70%, mRCA 90% (s/p staged PCI w/ 2. 75 X 33 mm Xience Alpine DES);   Marland Kitchen Carpal tunnel syndrome, bilateral   . COPD (chronic obstructive pulmonary disease) ([place]) 2013  . DJD (degenerative joint disease) of knee   . STEMI (ST elevation myocardial infarction) ([place])  03/31/2015   Subtotal OM2 - PCI with DES. EF 45-50%; Staged PCI  of RCA    Past Surgical History:  Procedure Laterality Date  . CARDIAC CATHETERIZATION N/A 03/31/2015   Procedure: Left Heart Cath and Coronary Angiography;  Surgeon: Leonie Man, MD;  Location: [facility];  Service: Cardiovascular;  Subtotoal OM2, 85% RCA with diffuse upstream Dz. EF 45-50%  . CARDIAC CATHETERIZATION N/A 03/31/2015   Procedure: Coronary Stent Intervention;  Surgeon: Leonie Man, MD;  Location: [facility];: OM2 99% - Synergy DES 2.75 mm x 16 mm (3.2  mm).   . CARDIAC CATHETERIZATION N/A 04/02/2015   Procedure: Coronary Stent Intervention;  Surgeon: Wellington Hampshire, MD;  Location: [facility]: STAGED PCI  of RCA  90% - Xience DES 2.75 mm x 33 mm  . HERNIA REPAIR     right inguinal  . POSTERIOR LAMINECTOMY / DECOMPRESSION LUMBAR SPINE  2007  . TONSILLECTOMY    . TRANSTHORACIC ECHOCARDIOGRAM  06/2015   EF 55-60%, mild LVH. Normal wall motion. Gr 1 DD.    Post-PCI 03/2015   Current Meds  Medication Sig  . carvedilol (COREG) 6.25 MG tablet TAKE 1 TABLET BY MOUTH 2 TIMES DAILY WITH A MEAL.  . nitroGLYCERIN (NITROSTAT) 0.4 MG SL tablet Place 1 tablet (0.4 mg total) under the tongue every 5 (five) minutes as needed for chest pain.  . rosuvastatin (CRESTOR) 10 MG tablet TAKE 1 TABLET BY MOUTH DAILY.  . [DISCONTINUED] BRILINTA 90 MG TABS tablet TAKE 1 TABLET BY MOUTH 2 TIMES DAILY.    Allergies  Allergen Reactions  . Penicillins Anaphylaxis  . Atorvastatin Other (See Comments)    myalgias    Social History   Tobacco Use  . Smoking status: Former Smoker    Packs/day: 3.00    Years: 35.00    Pack years: 105.00    Types: Cigarettes    Last attempt to quit: 03/31/2015    Years since quitting: 2.9  . Smokeless tobacco: Former Systems developer  . Tobacco comment: trying to-"you gotta have some will power.  I don't have my mind right to do it yet."  Substance Use Topics  . Alcohol use: Yes    Alcohol/week: 0.0 - 4.0 standard drinks  . Drug use: No   Social History   Social History Narrative   Denied application for additional life insurance policy due to recent diagnosis of COPD.    family history includes Cancer in his maternal grandfather and paternal grandmother; Cancer (age of onset: 21) in his father; Hearing loss in his mother; Heart attack in his father; Heart disease in his father and paternal grandfather; Hypertension in his mother; Stroke in his father.  Wt Readings from Last 3 Encounters:  03/02/18 185 lb (83.9 kg)    03/02/17 187 lb 9.6 oz (85.1 kg)  08/03/16 196 lb 9.6 oz (89.2 kg)    PHYSICAL EXAM BP 94/68   Pulse (!) 55   Ht 5\' 10"  (1.778 m)   Wt 185 lb (83.9 kg)   BMI 26.54 kg/m  Physical Exam  Constitutional: He is oriented to person, place, and time. He appears well-developed and well-nourished. No distress.  Healthy-appearing.  Well-groomed.  HENT:  Head: Normocephalic and atraumatic.  Neck: Normal range of motion. Neck supple. No hepatojugular reflux and no JVD present. Carotid bruit is not present.  Cardiovascular: Normal rate, regular rhythm, S1 normal and normal pulses.  No extrasystoles are present. PMI is not displaced. Exam reveals no gallop and no friction rub.  No murmur heard. Split S2  Pulmonary/Chest: Effort normal and breath sounds normal. No respiratory distress. He has no wheezes. He has no rales.  Abdominal: Soft. Bowel sounds are normal. He exhibits no distension. There is no tenderness.  Musculoskeletal: Normal range of motion. He exhibits no edema.  Neurological: He is alert and oriented to person, place, and time.  Psychiatric: He has a normal mood and affect. His behavior is normal. Judgment and thought content normal.  Nursing note and vitals reviewed.   Adult ECG Report  Rate: 55;  Rhythm: sinus bradycardia and Incomplete right bundle branch block.;  Non-specific ST-T changes.  Normal axis, intervals and durations  Narrative Interpretation: Stable EKG   Other studies Reviewed: Additional studies/ records that were reviewed today include:  Recent Labs:   Checked today Lab Results  Component Value Date   CHOL 106 03/02/2018   HDL 52 03/02/2018   LDLCALC 43 03/02/2018   TRIG 53 03/02/2018   CHOLHDL 2.0 03/02/2018    ASSESSMENT / PLAN: Problem List Items Addressed This Visit    Atherosclerotic heart disease of native coronary artery without angina pectoris (Chronic)    He has existing 80% stenosis in a small branch of the OM.  Doing well on medical  management.  No recurrent anginal symptoms.  Too far down for revascularizing this branch. Continue to monitor on low-dose beta-blocker, and statin.  Now with reduced dose of Brilinta.      CAD S/P percutaneous coronary angioplasty - Primary (Chronic)    Culprit lesion for his MI was the OM, but he had diffuse disease in the RCA treated for staged PCI 2 days later.  Has not had any further anginal symptoms since then.  Has totally complied with medical management and with his lifestyle.  He is quit smoking, has lost weight, and in doing so his lipid control has been much improved.  Plan: Okay to reduce Brilinta to 60 mg twice daily with new prescription.  This could be held for procedures as he is now 3 years status post PCI) with extensive stenting in the RCA I would like to keep it on indefinitely. He remains on low-dose carvedilol which I told him he can held if blood pressure gets too low.  He is also on stable dose of Crestor.      Relevant Orders   EKG 12-Lead (Completed)   Lipid panel (Completed)   Comprehensive metabolic panel (Completed)   Essential hypertension (Chronic)    Not really an issue for him.  Blood pressure findings a little low with carvedilol.  I just continue to recommend that he takes carvedilol after he eats a meal.  He when he does this it usually is fine.      Hyperlipidemia with target LDL less than 70 (Chronic)    As per usual, he did not eat this morning so we did check his lipid panel today.  Currently on Crestor 10 mg daily and has had well-controlled lipids since the initial post PCI check.   Addendum: Lipid panel reviewed.  LDL 43 total cholesterol 106.  Excellent control.  No change.      Relevant Orders   Lipid panel (Completed)   Comprehensive metabolic panel (Completed)   Quit smoking (Chronic)    Feeling much better.  No more coughing.  Breathing improved.  I congratulated his efforts.         Current medicines are reviewed at length with  the patient today. (+/- concerns) none The following changes have been made:Reduced dose of  Brilinta to 60 mg twice daily. -->  Would be okay to hold Brilinta for surgeries.  If possible would like him to take 81 mg aspirin if off of Brilinta.  Patient Instructions  Medication Instructions:    COMPLETE TAKING 90 MG OF BRILINTA THEN STOP - START TAKING 60 MG OF BRILINTA TWICE A DAY  If you need a refill on your cardiac medications before your next appointment, please call your pharmacy.   Lab work: TODAY Lipid CMP  If you have labs (blood work) drawn today and your tests are completely normal, you will receive your results only by: Marland Kitchen MyChart Message (if you have MyChart) OR . A paper copy in the mail If you have any lab test that is abnormal or we need to change your treatment, we will call you to review the results.  Testing/Procedures:  NOT NEEDED  Follow-Up: At [facility], you and your health needs are our priority.  As part of our continuing mission to provide you with exceptional heart care, we have created designated Provider Care Teams.  These Care Teams include your primary Cardiologist (physician) and Advanced Practice Providers (APPs -  Physician Assistants and Nurse Practitioners) who all work together to provide you with the care you need, when you need it. You will need a follow up appointment in 12 months.  Please call our office 2 months in advance to schedule this appointment.  You may see Glenetta Hew, MD or one of the following Advanced Practice Providers on your designated Care Team:   Rosaria Ferries, PA-C . Jory Sims, DNP, ANP  Any Other Special Instructions Will Be Listed Below (If Applicable).     Studies Ordered:   Orders Placed This Encounter  Procedures  . Lipid panel  . Comprehensive metabolic panel  . EKG 12-Lead      Glenetta Hew, M.D., M.S. Interventional Cardiologist   Pager # [phone] Phone # [phone] [place], [place] [address]

## 2018-03-02 NOTE — Patient Instructions (Signed)
Medication Instructions:    COMPLETE TAKING 90 MG OF BRILINTA THEN STOP - START TAKING 60 MG OF BRILINTA TWICE A DAY  If you need a refill on your cardiac medications before your next appointment, please call your pharmacy.   Lab work: TODAY Lipid CMP  If you have labs (blood work) drawn today and your tests are completely normal, you will receive your results only by: Marland Kitchen MyChart Message (if you have MyChart) OR . A paper copy in the mail If you have any lab test that is abnormal or we need to change your treatment, we will call you to review the results.  Testing/Procedures:  NOT NEEDED  Follow-Up: At [facility], you and your health needs are our priority.  As part of our continuing mission to provide you with exceptional heart care, we have created designated Provider Care Teams.  These Care Teams include your primary Cardiologist (physician) and Advanced Practice Providers (APPs -  Physician Assistants and Nurse Practitioners) who all work together to provide you with the care you need, when you need it. You will need a follow up appointment in 12 months.  Please call our office 2 months in advance to schedule this appointment.  You may see Glenetta Hew, MD or one of the following Advanced Practice Providers on your designated Care Team:   Rosaria Ferries, PA-C . Jory Sims, DNP, ANP  Any Other Special Instructions Will Be Listed Below (If Applicable).

## 2018-03-04 ENCOUNTER — Encounter: Payer: Self-pay | Admitting: Cardiology

## 2018-03-04 NOTE — Assessment & Plan Note (Signed)
He has existing 80% stenosis in a small branch of the OM.  Doing well on medical management.  No recurrent anginal symptoms.  Too far down for revascularizing this branch. Continue to monitor on low-dose beta-blocker, and statin.  Now with reduced dose of Brilinta.

## 2018-03-04 NOTE — Assessment & Plan Note (Signed)
Feeling much better.  No more coughing.  Breathing improved.  I congratulated his efforts.

## 2018-03-04 NOTE — Assessment & Plan Note (Addendum)
As per usual, he did not eat this morning so we did check his lipid panel today.  Currently on Crestor 10 mg daily and has had well-controlled lipids since the initial post PCI check.   Addendum: Lipid panel reviewed.  LDL 43 total cholesterol 106.  Excellent control.  No change.

## 2018-03-04 NOTE — Assessment & Plan Note (Signed)
Culprit lesion for his MI was the OM, but he had diffuse disease in the RCA treated for staged PCI 2 days later.  Has not had any further anginal symptoms since then.  Has totally complied with medical management and with his lifestyle.  He is quit smoking, has lost weight, and in doing so his lipid control has been much improved.  Plan: Okay to reduce Brilinta to 60 mg twice daily with new prescription.  This could be held for procedures as he is now 3 years status post PCI) with extensive stenting in the RCA I would like to keep it on indefinitely. He remains on low-dose carvedilol which I told him he can held if blood pressure gets too low.  He is also on stable dose of Crestor.

## 2018-03-04 NOTE — Assessment & Plan Note (Signed)
Not really an issue for him.  Blood pressure findings a little low with carvedilol.  I just continue to recommend that he takes carvedilol after he eats a meal.  He when he does this it usually is fine.

## 2018-03-22 ENCOUNTER — Other Ambulatory Visit: Payer: Self-pay | Admitting: Cardiology

## 2019-03-05 ENCOUNTER — Ambulatory Visit: Payer: BC Managed Care – PPO | Admitting: Cardiology

## 2019-03-05 ENCOUNTER — Encounter: Payer: Self-pay | Admitting: Cardiology

## 2019-03-05 ENCOUNTER — Other Ambulatory Visit: Payer: Self-pay

## 2019-03-05 VITALS — BP 132/79 | HR 60 | Ht 70.0 in | Wt 180.8 lb

## 2019-03-05 DIAGNOSIS — I10 Essential (primary) hypertension: Secondary | ICD-10-CM | POA: Diagnosis not present

## 2019-03-05 DIAGNOSIS — Z23 Encounter for immunization: Secondary | ICD-10-CM

## 2019-03-05 DIAGNOSIS — Z87891 Personal history of nicotine dependence: Secondary | ICD-10-CM

## 2019-03-05 DIAGNOSIS — E785 Hyperlipidemia, unspecified: Secondary | ICD-10-CM

## 2019-03-05 DIAGNOSIS — Z9861 Coronary angioplasty status: Secondary | ICD-10-CM | POA: Diagnosis not present

## 2019-03-05 DIAGNOSIS — I2510 Atherosclerotic heart disease of native coronary artery without angina pectoris: Secondary | ICD-10-CM | POA: Diagnosis not present

## 2019-03-05 DIAGNOSIS — I2129 ST elevation (STEMI) myocardial infarction involving other sites: Secondary | ICD-10-CM

## 2019-03-05 LAB — COMPREHENSIVE METABOLIC PANEL
ALT: 16 IU/L (ref 0–44)
AST: 17 IU/L (ref 0–40)
Albumin/Globulin Ratio: 1.6 (ref 1.2–2.2)
Albumin: 4.1 g/dL (ref 3.8–4.9)
Alkaline Phosphatase: 52 IU/L (ref 39–117)
BUN/Creatinine Ratio: 22 — ABNORMAL HIGH (ref 9–20)
BUN: 20 mg/dL (ref 6–24)
Bilirubin Total: 0.3 mg/dL (ref 0.0–1.2)
CO2: 20 mmol/L (ref 20–29)
Calcium: 8.9 mg/dL (ref 8.7–10.2)
Chloride: 105 mmol/L (ref 96–106)
Creatinine, Ser: 0.91 mg/dL (ref 0.76–1.27)
GFR calc Af Amer: 108 mL/min/{1.73_m2} (ref >59)
GFR calc non Af Amer: 93 mL/min/{1.73_m2} (ref >59)
Globulin, Total: 2.5 g/dL (ref 1.5–4.5)
Glucose: 81 mg/dL (ref 65–99)
Potassium: 4.6 mmol/L (ref 3.5–5.2)
Sodium: 139 mmol/L (ref 134–144)
Total Protein: 6.6 g/dL (ref 6.0–8.5)

## 2019-03-05 LAB — LIPID PANEL
Chol/HDL Ratio: 1.7 ratio (ref 0.0–5.0)
Cholesterol, Total: 108 mg/dL (ref 100–199)
HDL: 63 mg/dL (ref >39)
LDL Chol Calc (NIH): 34 mg/dL (ref 0–99)
Triglycerides: 40 mg/dL (ref 0–149)
VLDL Cholesterol Cal: 11 mg/dL (ref 5–40)

## 2019-03-05 MED ORDER — ASPIRIN EC 81 MG PO TBEC: 81.0000 mg | DELAYED_RELEASE_TABLET | Freq: Every day | ORAL | 3 refills | Status: DC

## 2019-03-05 MED ORDER — CARVEDILOL 6.25 MG PO TABS: ORAL_TABLET | ORAL | 3 refills | Status: DC

## 2019-03-05 MED ORDER — ROSUVASTATIN CALCIUM 10 MG PO TABS: 10.0000 mg | ORAL_TABLET | Freq: Every day | ORAL | 3 refills | Status: DC

## 2019-03-05 MED ORDER — NITROGLYCERIN 0.4 MG SL SUBL: 0.4000 mg | SUBLINGUAL_TABLET | SUBLINGUAL | 6 refills | Status: DC | PRN

## 2019-03-05 NOTE — Assessment & Plan Note (Signed)
Still doing very well with no symptoms from small branch OM disease.  Doing well on medical management with low-dose beta-blocker and statin.  Converting from Brilinta to 81 mg aspirin when he completes current bottle.  If recurrent chest pain, would take 3 Brilinta on day 1 followed by 1 tablet twice daily until follow-up evaluation.

## 2019-03-05 NOTE — Assessment & Plan Note (Addendum)
Feels well.  Better energy, better breathing.  No coughing.  Congratulated on his efforts

## 2019-03-05 NOTE — Assessment & Plan Note (Signed)
Currently being on for years out from his PCI.  Okay to convert from Brilinta to 81 mg aspirin.  He will hold onto a weeks worth of Brilinta just in case he has an episode of chest pain, will restart.

## 2019-03-05 NOTE — Progress Notes (Signed)
Primary Care Provider: Mellody Dance, DO Cardiologist: Glenetta Hew, MD Electrophysiologist:   Clinic Note: Chief Complaint  Patient presents with   Follow-up   Coronary Artery Disease    HPI:    Gordon Hayes is a 58 y.o. male with a history of CAD having PCI (inferior STEMI December 2016-PCI to RCA and CX-LM) who presents today for annual follow-up.  Gordon Hayes was last seen on March 02, 2018, doing well.  No complaints.  Slightly low blood pressure.  Mild bruising.  Less myalgias with Crestor. --> Reduced to maintenance dose Brilinta 60 mg twice daily  Recent Hospitalizations: None  Reviewed  CV studies:    The following studies were reviewed today: (if available, images/films reviewed: From Epic Chart or Care Everywhere)  None:   Interval History:   Gordon Hayes returns for annual f/u stating that he continues to feel very well.  He has no major issues.  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  Staying active, staying busy.  Does not do routine exercise, but is always on the go.  Besides noting some mild bruising and easy bleeding on the Brilinta, he has no major complaints.  CV Review of Symptoms (Summary) no chest pain or dyspnea on exertion positive for - minor bruising - no major bleeding negative for - edema, irregular heartbeat, orthopnea, palpitations, paroxysmal nocturnal dyspnea, rapid heart rate, shortness of breath or syncope/near syncope, TIA/amaurosis fugax, claudication  Still continues to do well with NOT SMOKING>  The patient does not have symptoms concerning for COVID-19 infection (fever, chills, cough, or new shortness of breath).  The patient is practicing social distancing. ++ Masking.  ++ Groceries/shopping. 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 Review of  Systems  Constitutional: Negative for malaise/fatigue and weight loss.  HENT: Negative for congestion.   Respiratory: Negative for cough, sputum production and shortness of breath.   Cardiovascular:       Per HPI  Gastrointestinal: Negative for blood in stool, constipation, heartburn and melena.  Genitourinary: Negative for hematuria.  Musculoskeletal: Positive for joint pain (mild arthritis pains). Negative for falls.  Skin: Negative for rash.       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  Endo/Heme/Allergies: Bruises/bleeds easily.  Psychiatric/Behavioral: Negative for depression and memory loss. The patient is not nervous/anxious and does not have insomnia.    I have reviewed and (if needed) personally updated the patient's problem list, medications, allergies, past medical and surgical history, social and family history.   PAST MEDICAL HISTORY   Past Medical History:  Diagnosis Date   Back pain, lumbosacral    CAD S/P percutaneous coronary angioplasty 03/2015   OM2 99>0% w/ 2.75 mm x 16 mm (3.2 mm) Synergy DES; inferior OM2 branch 80%, med rx, pRCA 70%, mRCA 90% (s/p staged PCI w/ 2. 75 X 33 mm Xience [place]pine DES);    Carpal tunnel syndrome, bilateral    COPD (chronic obstructive pulmonary disease) ([place]) 2013   DJD (degenerative joint disease) of knee    STEMI (ST elevation myocardial infarction) ([place])  03/31/2015   Subtotal OM2 - PCI with DES. EF 45-50%; Staged PCI of RCA    PAST SURGICAL HISTORY   Past Surgical History:  Procedure Laterality Date   CARDIAC  CATHETERIZATION N/A 03/31/2015   Procedure: Left Heart Cath and Coronary Angiography;  Surgeon: Leonie Man, MD;  Location: [facility];  Service: Cardiovascular;  Subtotoal OM2, 85% RCA with diffuse upstream Dz. EF 45-50%   CARDIAC CATHETERIZATION N/A 03/31/2015   Procedure: Coronary Stent Intervention;  Surgeon: Leonie Man, MD;  Location: [facility];: OM2 99% - Synergy DES 2.75 mm x 16 mm (3.2 mm).    CARDIAC CATHETERIZATION N/A 04/02/2015   Procedure: Coronary Stent Intervention;  Surgeon: Wellington Hampshire, MD;  Location: [facility]: STAGED PCI  of RCA  90% - Xience DES 2.75 mm x 33 mm   HERNIA REPAIR     right inguinal   POSTERIOR LAMINECTOMY / DECOMPRESSION LUMBAR SPINE  2007   TONSILLECTOMY     TRANSTHORACIC ECHOCARDIOGRAM  06/2015   EF 55-60%, mild LVH. Normal wall motion. Gr 1 DD.       MEDICATIONS/ALLERGIES   Current Meds  Medication Sig   carvedilol (COREG) 6.25 MG tablet TAKE 1 TABLET BY MOUTH 2 TIMES DAILY WITH A MEAL.   nitroGLYCERIN (NITROSTAT) 0.4 MG SL tablet Place 1 tablet (0.4 mg total) under the tongue every 5 (five) minutes as needed for chest pain.   rosuvastatin (CRESTOR) 10 MG tablet Take 1 tablet (10 mg total) by mouth daily.   [DISCONTINUED] carvedilol (COREG) 6.25 MG tablet TAKE 1 TABLET BY MOUTH 2 TIMES DAILY WITH A MEAL.   [DISCONTINUED] nitroGLYCERIN (NITROSTAT) 0.4 MG SL tablet Place 1 tablet (0.4 mg total) under the tongue every 5 (five) minutes as needed for chest pain.   [DISCONTINUED] rosuvastatin (CRESTOR) 10 MG tablet TAKE 1 TABLET BY MOUTH DAILY.   [DISCONTINUED] ticagrelor (BRILINTA) 60 MG TABS tablet Take 1 tablet (60 mg total) by mouth 2 (two) times daily.    Allergies  Allergen Reactions   Penicillins Anaphylaxis   Atorvastatin Other (See Comments)    myalgias     SOCIAL HISTORY/FAMILY HISTORY   Social History   Tobacco Use   Smoking status: Former Smoker    Packs/day: 3.00    Years: 35.00    Pack years: 105.00    Types: Cigarettes    Quit date: 03/31/2015    Years since quitting: 3.9   Smokeless tobacco: Former Systems developer   Tobacco comment: trying to-"you gotta have some will power.  I don't have my mind right to do it yet."  Substance Use Topics   Alcohol use: Yes    Alcohol/week: 0.0 - 4.0 standard drinks   Drug use: No   Social  History   Social History Narrative   Denied application for additional life insurance policy due to recent diagnosis of COPD.    Family History family history includes Cancer in his maternal grandfather and paternal grandmother; Cancer (age of onset: 5) in his father; Hearing loss in his mother; Heart attack in his father; Heart disease in his father and paternal grandfather; Hypertension in his mother; Stroke in his father.   OBJCTIVE -PE, EKG, labs   Wt Readings from Last 3 Encounters:  03/05/19 180 lb 12.8 oz (82 kg)  03/02/18 185 lb (83.9 kg)  03/02/17 187 lb 9.6 oz (85.1 kg)    Physical Exam: BP 132/79    Pulse 60    Ht 5\' 10"  (1.778 m)    Wt 180 lb 12.8 oz (82 kg)    SpO2 96%    BMI 25.94 kg/m  Physical Exam  Constitutional: He is oriented  to person, place, and time. He appears well-developed and well-nourished. No distress.  HENT:  Head: Normocephalic and atraumatic.  Neck: Normal range of motion. Neck supple. No hepatojugular reflux and no JVD present. Carotid bruit is not present.  Cardiovascular: Normal rate, regular rhythm, normal heart sounds and normal pulses.  Occasional extrasystoles are present. PMI is not displaced. Exam reveals no gallop and no friction rub.  No murmur heard. Pulmonary/Chest: Effort normal and breath sounds normal. No respiratory distress. He has no wheezes. He has no rales.  Abdominal: Soft. Bowel sounds are normal. He exhibits no distension. There is no abdominal tenderness. There is no rebound.  No HSM  Musculoskeletal: Normal range of motion.        General: No edema.  Neurological: He is alert and oriented to person, place, and time.  Psychiatric: He has a normal mood and affect. His behavior is normal. Judgment and thought content normal.  Vitals reviewed.    Adult ECG Report  Rate: 60 ;  Rhythm: normal sinus rhythm and normal axis, intervals & durations;   Narrative Interpretation: stable/normal EKG  Recent Labs:    Lab Results    Component Value Date   CHOL 106 03/02/2018   HDL 52 03/02/2018   LDLCALC 43 03/02/2018   TRIG 53 03/02/2018   CHOLHDL 2.0 03/02/2018    Lab Results  Component Value Date   CREATININE 0.82 03/02/2018   BUN 13 03/02/2018   NA 142 03/02/2018   K 4.7 03/02/2018   CL 105 03/02/2018   CO2 21 03/02/2018    ASSESSMENT/PLAN    Problem List Items Addressed This Visit    ST elevation myocardial infarction (STEMI) of lateral wall, subsequent episode of care ([place]) (Chronic)    Culprit lesion was subtotally occluded Circumflex-OM1 treated with DES followed by staged RCA PCI.  EF improved on follow-up echocardiogram.  No signs of heart failure or angina.  On stable medications.      Relevant Medications   nitroGLYCERIN (NITROSTAT) 0.4 MG SL tablet   rosuvastatin (CRESTOR) 10 MG tablet   carvedilol (COREG) 6.25 MG tablet   aspirin EC 81 MG tablet   Other Relevant Orders   EKG 12-Lead   Atherosclerotic heart disease of native coronary artery without angina pectoris - Primary (Chronic)    Still doing very well with no symptoms from small branch OM disease.  Doing well on medical management with low-dose beta-blocker and statin.  Converting from Brilinta to 81 mg aspirin when he completes current bottle.  If recurrent chest pain, would take 3 Brilinta on day 1 followed by 1 tablet twice daily until follow-up evaluation.      Relevant Medications   nitroGLYCERIN (NITROSTAT) 0.4 MG SL tablet   rosuvastatin (CRESTOR) 10 MG tablet   carvedilol (COREG) 6.25 MG tablet   aspirin EC 81 MG tablet   Other Relevant Orders   Lipid panel   Comprehensive metabolic panel   EKG XX123456   Essential hypertension (Chronic)    Blood pressure very stable on current dose of carvedilol.  Did not do well with higher doses because of hypotension issues.  Currently stable.  No changes.      Relevant Medications   nitroGLYCERIN (NITROSTAT) 0.4 MG SL tablet   rosuvastatin (CRESTOR) 10 MG tablet    carvedilol (COREG) 6.25 MG tablet   aspirin EC 81 MG tablet   Quit smoking (Chronic)    Feels well.  Better energy, better breathing.  No coughing.  Congratulated on his  efforts      Hyperlipidemia with target LDL less than 70 (Chronic)    Lipids as of November 2019 were well controlled.  Due for follow-up lipids now.  Continue current dose of statin unless values worsen.      Relevant Medications   nitroGLYCERIN (NITROSTAT) 0.4 MG SL tablet   rosuvastatin (CRESTOR) 10 MG tablet   carvedilol (COREG) 6.25 MG tablet   aspirin EC 81 MG tablet   Other Relevant Orders   Lipid panel   CAD S/P percutaneous coronary angioplasty (Chronic)    Currently being on for years out from his PCI.  Okay to convert from Brilinta to 81 mg aspirin.  He will hold onto a weeks worth of Brilinta just in case he has an episode of chest pain, will restart.      Relevant Medications   nitroGLYCERIN (NITROSTAT) 0.4 MG SL tablet   rosuvastatin (CRESTOR) 10 MG tablet   carvedilol (COREG) 6.25 MG tablet   aspirin EC 81 MG tablet   Other Relevant Orders   Lipid panel   Comprehensive metabolic panel   EKG [id number]    Other Visit Diagnoses    Need for immunization against influenza       Relevant Orders   Flu Vaccine QUAD 36+ mos IM (Completed)      COVID-19 Education: The signs and symptoms of COVID-19 were discussed with the patient and how to seek care for testing (follow up with PCP or arrange E-visit).   The importance of social distancing was discussed today.  I spent a total of 15 minutes with the patient and chart review. >  50% of the time was spent in direct patient consultation.  Additional time spent with chart review (studies, outside notes, etc): 5 Total Time: 20 min   Current medicines are reviewed at length with the patient today.  (+/- concerns) NONE   Patient Instructions / Medication Changes & Studies & Tests Ordered   Patient Instructions  Medication Instructions:  COMPLETE  TAKING BRILINTA 60 MG  ( LEAVE ABOUT 7 DAYS   WORTH )  START TAKING ASPIRIN 81 MG  ONE TABLET A DAY ONCE COMPLETED  BRILINTA   *If you need a refill on your cardiac medications before your next appointment, please call your pharmacy*  Lab Work: Lipid  CMP If you have labs (blood work) drawn today and your tests are completely normal, you will receive your results only by:  MyChart Message (if you have MyChart) OR  A paper copy in the mail If you have any lab test that is abnormal or we need to change your treatment, we will call you to review the results.  Testing/Procedures: Not needed  Follow-Up: At [facility], you and your health needs are our priority.  As part of our continuing mission to provide you with exceptional heart care, we have created designated Provider Care Teams.  These Care Teams include your primary Cardiologist (physician) and Advanced Practice Providers (APPs -  Physician Assistants and Nurse Practitioners) who all work together to provide you with the care you need, when you need it.  Your next appointment:   12 months  The format for your next appointment:   In Person  Provider:   Glenetta Hew, MD  Other Instructions  IF YOU DEVELOP CHEST PAIN YOU MAY TAKE  BRILINTA 60 MG - 3 TABLETS CALL OFFICE AND CONTINUE TABLET UNTIL APPOINTMENT    Studies Ordered:   Orders Placed This Encounter  Procedures  Flu Vaccine QUAD 36+ mos IM   Lipid panel   Comprehensive metabolic panel   EKG [id number]     Glenetta Hew, M.D., M.S. Interventional Cardiologist   Pager # [phone] Phone # [phone] [address] [place], [place] [address]   Thank you for choosing Heartcare at [facility]!!

## 2019-03-05 NOTE — Patient Instructions (Signed)
Medication Instructions:  COMPLETE TAKING BRILINTA 60 MG  ( LEAVE ABOUT 7 DAYS   WORTH )  START TAKING ASPIRIN 81 MG  ONE TABLET A DAY ONCE COMPLETED  BRILINTA   *If you need a refill on your cardiac medications before your next appointment, please call your pharmacy*  Lab Work: Lipid  CMP If you have labs (blood work) drawn today and your tests are completely normal, you will receive your results only by: Marland Kitchen MyChart Message (if you have MyChart) OR . A paper copy in the mail If you have any lab test that is abnormal or we need to change your treatment, we will call you to review the results.  Testing/Procedures: Not needed  Follow-Up: At [facility], you and your health needs are our priority.  As part of our continuing mission to provide you with exceptional heart care, we have created designated Provider Care Teams.  These Care Teams include your primary Cardiologist (physician) and Advanced Practice Providers (APPs -  Physician Assistants and Nurse Practitioners) who all work together to provide you with the care you need, when you need it.  Your next appointment:   12 months  The format for your next appointment:   In Person  Provider:   Glenetta Hew, MD  Other Instructions  IF YOU DEVELOP CHEST PAIN YOU MAY TAKE  BRILINTA 60 MG - 3 TABLETS CALL OFFICE AND CONTINUE TABLET UNTIL APPOINTMENT

## 2019-03-05 NOTE — Assessment & Plan Note (Signed)
Culprit lesion was subtotally occluded Circumflex-OM1 treated with DES followed by staged RCA PCI.  EF improved on follow-up echocardiogram.  No signs of heart failure or angina.  On stable medications.

## 2019-03-05 NOTE — Assessment & Plan Note (Signed)
Blood pressure very stable on current dose of carvedilol.  Did not do well with higher doses because of hypotension issues.  Currently stable.  No changes.

## 2019-03-05 NOTE — Assessment & Plan Note (Signed)
Lipids as of November 2019 were well controlled.  Due for follow-up lipids now.  Continue current dose of statin unless values worsen.

## 2019-07-06 ENCOUNTER — Ambulatory Visit: Payer: BC Managed Care – PPO | Attending: Internal Medicine

## 2019-07-06 DIAGNOSIS — Z23 Encounter for immunization: Secondary | ICD-10-CM

## 2019-07-06 NOTE — Progress Notes (Signed)
   Covid-19 Vaccination Clinic  Name:  Gordon Hayes    MRN: [id number] DOB: Dec 05, 1960  07/06/2019  [name] was observed post Covid-19 immunization for 30 minutes based on pre-vaccination screening without incident. He was provided with Vaccine Information Sheet and instruction to access the V-Safe system.   [name] was instructed to call 911 with any severe reactions post vaccine: Marland Kitchen Difficulty breathing  . Swelling of face and throat  . A fast heartbeat  . A bad rash all over body  . Dizziness and weakness   Immunizations Administered    Name Date Dose VIS Date Route   Pfizer COVID-19 Vaccine 07/06/2019  9:28 AM 0.3 mL 04/06/2019 Intramuscular   Manufacturer: [place]   Lot: [id number]   [place]: [id number]

## 2019-07-31 ENCOUNTER — Ambulatory Visit: Payer: BC Managed Care – PPO | Attending: Internal Medicine

## 2019-07-31 DIAGNOSIS — Z23 Encounter for immunization: Secondary | ICD-10-CM

## 2019-07-31 NOTE — Progress Notes (Signed)
   Covid-19 Vaccination Clinic  Name:  Gordon Hayes    MRN: [id number] DOB: 24-Apr-1961  07/31/2019  Gordon Hayes was observed post Covid-19 immunization for 15 minutes without incident. He was provided with Vaccine Information Sheet and instruction to access the V-Safe system.   Gordon Hayes was instructed to call 911 with any severe reactions post vaccine: Marland Kitchen Difficulty breathing  . Swelling of face and throat  . A fast heartbeat  . A bad rash all over body  . Dizziness and weakness   Immunizations Administered    Name Date Dose VIS Date Route   Pfizer COVID-19 Vaccine 07/31/2019  8:36 AM 0.3 mL 04/06/2019 Intramuscular   Manufacturer: [place]   Lot: [id number]   [place]: [id number]

## 2020-03-06 ENCOUNTER — Other Ambulatory Visit: Payer: Self-pay | Admitting: Cardiology

## 2020-03-07 ENCOUNTER — Encounter: Payer: Self-pay | Admitting: Cardiology

## 2020-03-07 ENCOUNTER — Ambulatory Visit: Payer: BC Managed Care – PPO | Admitting: Cardiology

## 2020-03-07 ENCOUNTER — Other Ambulatory Visit: Payer: Self-pay

## 2020-03-07 VITALS — BP 134/80 | HR 57 | Ht 70.0 in | Wt 182.6 lb

## 2020-03-07 DIAGNOSIS — I10 Essential (primary) hypertension: Secondary | ICD-10-CM | POA: Diagnosis not present

## 2020-03-07 DIAGNOSIS — E785 Hyperlipidemia, unspecified: Secondary | ICD-10-CM | POA: Diagnosis not present

## 2020-03-07 DIAGNOSIS — Z9861 Coronary angioplasty status: Secondary | ICD-10-CM | POA: Diagnosis not present

## 2020-03-07 DIAGNOSIS — I2510 Atherosclerotic heart disease of native coronary artery without angina pectoris: Secondary | ICD-10-CM | POA: Diagnosis not present

## 2020-03-07 DIAGNOSIS — Z23 Encounter for immunization: Secondary | ICD-10-CM | POA: Diagnosis not present

## 2020-03-07 DIAGNOSIS — I2129 ST elevation (STEMI) myocardial infarction involving other sites: Secondary | ICD-10-CM | POA: Diagnosis not present

## 2020-03-07 LAB — COMPREHENSIVE METABOLIC PANEL
ALT: 18 IU/L (ref 0–44)
AST: 20 IU/L (ref 0–40)
Albumin/Globulin Ratio: 1.7 (ref 1.2–2.2)
Albumin: 4.2 g/dL (ref 3.8–4.9)
Alkaline Phosphatase: 57 IU/L (ref 44–121)
BUN/Creatinine Ratio: 13 (ref 9–20)
BUN: 11 mg/dL (ref 6–24)
Bilirubin Total: 0.4 mg/dL (ref 0.0–1.2)
CO2: 23 mmol/L (ref 20–29)
Calcium: 8.9 mg/dL (ref 8.7–10.2)
Chloride: 103 mmol/L (ref 96–106)
Creatinine, Ser: 0.83 mg/dL (ref 0.76–1.27)
GFR calc Af Amer: 112 mL/min/{1.73_m2} (ref >59)
GFR calc non Af Amer: 97 mL/min/{1.73_m2} (ref >59)
Globulin, Total: 2.5 g/dL (ref 1.5–4.5)
Glucose: 90 mg/dL (ref 65–99)
Potassium: 4.6 mmol/L (ref 3.5–5.2)
Sodium: 134 mmol/L (ref 134–144)
Total Protein: 6.7 g/dL (ref 6.0–8.5)

## 2020-03-07 LAB — LIPID PANEL
Chol/HDL Ratio: 1.8 ratio (ref 0.0–5.0)
Cholesterol, Total: 124 mg/dL (ref 100–199)
HDL: 69 mg/dL (ref >39)
LDL Chol Calc (NIH): 44 mg/dL (ref 0–99)
Triglycerides: 43 mg/dL (ref 0–149)
VLDL Cholesterol Cal: 11 mg/dL (ref 5–40)

## 2020-03-07 MED ORDER — CARVEDILOL 6.25 MG PO TABS
ORAL_TABLET | ORAL | 3 refills | Status: DC
Start: 2020-03-07 — End: 2021-03-17

## 2020-03-07 MED ORDER — NITROGLYCERIN 0.4 MG SL SUBL: 0.4000 mg | SUBLINGUAL_TABLET | SUBLINGUAL | 6 refills | Status: DC | PRN

## 2020-03-07 MED ORDER — ROSUVASTATIN CALCIUM 10 MG PO TABS
10.0000 mg | ORAL_TABLET | Freq: Every day | ORAL | 3 refills | Status: DC
Start: 2020-03-07 — End: 2021-03-17

## 2020-03-07 NOTE — Progress Notes (Signed)
Primary Care Provider: Lorrene Reid, PA-C Cardiologist: Glenetta Hew, MD Electrophysiologist: None  Clinic Note: Chief Complaint  Patient presents with  . Follow-up    Annual follow-up.  No complaints.  . Coronary Artery Disease    No angina   HPI:    Gordon Hayes is a 59 y.o. male with a PMH of CAD-PCI (Inferior STEMI December 16-PCI to RCA and LCx-OM) who presents today for annual follow-up.   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  Was doing very well no major issues.  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  Staying active and busy.  No other major issues.  Was able to continue avoiding cigarettes.  No further smoking.  Recent Hospitalizations: None  Reviewed  CV studies:    The following studies were reviewed today: (if available, images/films reviewed: From Epic Chart or Care Everywhere) . None:   Interval History:   Gordon Hayes returns today here for annual follow-up doing wonderfully well.  He has no major complaints.  Still avoiding cigarettes.  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  He also does a lot of walking around the garage in between jobs.  He did yard work all weekend long for his 5 acre yard.  Continues to be totally asymptomatic.  Much less bruising since stopping Plavix.  No major cardiac issues.  Remains pretty isolated as far as try to avoid Covid.  He has had all 3 shots.  CV Review of Symptoms (Summary): no chest pain or dyspnea on exertion negative for - edema, irregular heartbeat, orthopnea, palpitations, paroxysmal nocturnal dyspnea, rapid heart rate,  shortness of breath or Syncope/near syncope, TIA/amaurosis fugax, claudication.  The patient does not have symptoms concerning for COVID-19 infection (fever, chills, cough, or new shortness of breath).   REVIEWED OF SYSTEMS   Review of Systems  Constitutional: Negative for malaise/fatigue and weight loss.  HENT: Negative for nosebleeds.   Respiratory: Negative for cough and shortness of breath.   Gastrointestinal: Negative for blood in stool.  Genitourinary: Positive for frequency (drinks lots of H20 - no more sweet tea or soft drinks). Negative for dysuria, hematuria and urgency.  Musculoskeletal: Positive for joint pain.  Neurological: Negative for dizziness, focal weakness, weakness and headaches.  Endo/Heme/Allergies: Does not bruise/bleed easily (mild bruising-lots of scrapes and cuts from Dealer work. - no more bleeding/bruising now than off of Plavix.  ).  Psychiatric/Behavioral: Negative for depression and memory loss. The patient is not nervous/anxious and does not have insomnia.    I have reviewed and (if needed) personally updated the patient's problem list, medications, allergies, past medical and surgical history, social and family history.   PAST MEDICAL HISTORY   Past Medical History:  Diagnosis Date  . Back pain, lumbosacral   . CAD S/P percutaneous coronary angioplasty 03/2015   OM2 99>0% w/ 2.75 mm x 16 mm (3.2 mm) Synergy DES; inferior OM2 branch 80%, med rx, pRCA 70%, mRCA 90% (s/p staged PCI w/ 2. 75 X 33 mm Xience Alpine DES);   Marland Kitchen Carpal tunnel syndrome, bilateral   . COPD (chronic obstructive pulmonary disease) ([place])  2013  . DJD (degenerative joint disease) of knee   . STEMI (ST elevation myocardial infarction) ([place])  03/31/2015   Subtotal OM2 - PCI with DES. EF 45-50%; Staged PCI of RCA    PAST SURGICAL HISTORY   Past Surgical History:  Procedure Laterality Date  . CARDIAC CATHETERIZATION N/A 03/31/2015   Procedure: Left Heart Cath and Coronary  Angiography;  Surgeon: Leonie Man, MD;  Location: [facility];  Service: Cardiovascular;  Subtotoal OM2, 85% RCA with diffuse upstream Dz. EF 45-50%  . CARDIAC CATHETERIZATION N/A 03/31/2015   Procedure: Coronary Stent Intervention;  Surgeon: Leonie Man, MD;  Location: [facility];: OM2 99% - Synergy DES 2.75 mm x 16 mm (3.2 mm).   . CARDIAC CATHETERIZATION N/A 04/02/2015   Procedure: Coronary Stent Intervention;  Surgeon: Wellington Hampshire, MD;  Location: [facility]: STAGED PCI  of RCA  90% - Xience DES 2.75 mm x 33 mm  . HERNIA REPAIR     right inguinal  . POSTERIOR LAMINECTOMY / DECOMPRESSION LUMBAR SPINE  2007  . TONSILLECTOMY    . TRANSTHORACIC ECHOCARDIOGRAM  06/2015   EF 55-60%, mild LVH. Normal wall motion. Gr 1 DD.    Post staged PCI 12 11/05/2014    Immunization History  Administered Date(s) Administered  . Influenza Split 02/01/2012  . Influenza,inj,Quad PF,6+ Mos 03/05/2019, 03/07/2020  . Influenza-Unspecified 05/29/2016  . PFIZER SARS-COV-2 Vaccination 07/06/2019, 07/31/2019, 01/25/2020  . Tdap 11/23/2011    MEDICATIONS/ALLERGIES   Current Meds  Medication Sig  . aspirin EC 81 MG tablet Take 1 tablet (81 mg total) by mouth daily.  . carvedilol (COREG) 6.25 MG tablet TAKE 6.25 MG  BY MOUTH  TWICE A DAY  . nitroGLYCERIN (NITROSTAT) 0.4 MG SL tablet Place 1 tablet (0.4 mg total) under the tongue every 5 (five) minutes as needed for chest pain.  . rosuvastatin (CRESTOR) 10 MG tablet Take 1 tablet (10 mg total) by mouth daily.  . [DISCONTINUED] carvedilol (COREG) 6.25 MG tablet TAKE 1 TABLET BY MOUTH 2 TIMES DAILY WITH A MEAL.  . [DISCONTINUED] nitroGLYCERIN (NITROSTAT) 0.4 MG SL tablet Place 1 tablet (0.4 mg total) under the tongue every 5 (five) minutes as needed for chest pain.  . [DISCONTINUED] rosuvastatin (CRESTOR) 10 MG tablet TAKE 1 TABLET BY MOUTH DAILY.    Allergies  Allergen Reactions  . Penicillins Anaphylaxis  . 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  Constitutional:      General: He is not in acute distress.    Appearance: Normal appearance. He is normal weight. He is not ill-appearing or toxic-appearing.  HENT:     Head: Normocephalic and atraumatic.  Neck:     Vascular: No carotid bruit, hepatojugular reflux or JVD.  Cardiovascular:     Rate and Rhythm: Normal rate and regular rhythm.  No extrasystoles are present.    Chest  Wall: PMI is not displaced.     Pulses: Normal pulses.     Heart sounds: Normal heart sounds. No murmur heard.  No friction rub. No gallop.   Pulmonary:     Effort: Pulmonary effort is normal. No respiratory distress.     Breath sounds: Normal breath sounds.  Chest:     Chest wall: No tenderness.  Musculoskeletal:        General: No swelling. Normal range of motion.     Cervical back: Normal range of motion and neck supple.  Neurological:     General: No focal deficit present.     Mental Status: He is alert and oriented to person, place, and time. Mental status is at baseline.  Psychiatric:        Mood and Affect: Mood normal.        Behavior: Behavior normal.        Thought Content: Thought content normal.        Judgment: Judgment normal.     Adult ECG Report  Rate: 57 ;  Rhythm: normal sinus rhythm and inc RBBB, non-specific ST-T changes;   Narrative Interpretation: stable -minmal differences  Recent Labs:  Pending today  Lab Results  Component Value Date   CHOL 124 03/07/2020   HDL 69 03/07/2020    LDLCALC 44 03/07/2020   TRIG 43 03/07/2020   CHOLHDL 1.8 03/07/2020   Lab Results  Component Value Date   CREATININE 0.83 03/07/2020   BUN 11 03/07/2020   NA 134 03/07/2020   K 4.6 03/07/2020   CL 103 03/07/2020   CO2 23 03/07/2020   Lab Results  Component Value Date   TSH 1.49 11/03/2015    ASSESSMENT/PLAN    Problem List Items Addressed This Visit    ST elevation myocardial infarction (STEMI) of lateral wall, subsequent episode of care ([place]) - Primary (Chronic)    Now 5 years out from inferior STEMI.  Culprit lesion was a total occlusion LCx-OM.  This was treated with DES following staged RCA PCI.  Preserved EF on follow-up echocardiogram.  No active angina or heart rate symptoms.  On stable meds.      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  Is on aspirin, statin and beta-blocker stable doses.  He needs refill of his nitroglycerin, but he has not had to use it since his PCI.      Relevant Medications   rosuvastatin (CRESTOR) 10 MG tablet   carvedilol (COREG) 6.25 MG tablet   nitroGLYCERIN (NITROSTAT) 0.4 MG SL tablet   Other Relevant Orders   EKG 12-Lead (Completed)   Lipid panel (Completed)   Comprehensive metabolic panel (Completed)   Essential hypertension (Chronic)    He says at home his pressures are more consistent with 120/80 where his hair is little higher.  Continue to monitor.  Next Plan: Continue current dose of carvedilol.  He did not tolerate higher doses in the past.      Relevant Medications   rosuvastatin (CRESTOR) 10 MG tablet   carvedilol (COREG) 6.25 MG tablet   nitroGLYCERIN  (NITROSTAT) 0.4 MG SL tablet   Hyperlipidemia with target  LDL less than 70 (Chronic)    Labs as of last year were excellent.  Recheck labs today.  Continue to be excellent with LDL still less than 50 at 44.  A little bit higher than last year.  Plan: Continue current dose of statin.  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   The importance of social distancing and COVID-19 vaccination was discussed today. 1 min The patient is practicing social distancing & Masking.   I spent a total of 25 minutes with the patient spent in direct patient consultation.  Additional time spent with chart review  / charting (studies, outside notes, etc): 5 Total Time: 4min   Current medicines are reviewed at length with the patient today.  (+/- concerns) n/a  This visit occurred during the SARS-CoV-2 public health emergency.  Safety protocols were in place, including screening questions prior to the visit, additional  usage of staff PPE, and extensive cleaning of exam room while observing appropriate contact time as indicated for disinfecting solutions.  Notice: This dictation was prepared with Dragon dictation along with smaller phrase technology. Any transcriptional errors that result from this process are unintentional and may not be corrected upon review.  Patient Instructions / Medication Changes & Studies & Tests Ordered   Patient Instructions  Medication Instructions:  No changes  *If you need a refill on your cardiac medications before your next appointment, please call your pharmacy*   Lab Work:  If you have labs (blood work) drawn today and your tests are completely normal, you will receive your results only by: Marland Kitchen MyChart Message (if you have MyChart) OR . A paper copy in the mail If you have any lab test that is abnormal or we need to change your treatment, we will call you to review the results.   Testing/Procedures:  Not needed  Follow-Up: At [facility], you and your health needs are our priority.  As part of our continuing mission to provide you with exceptional heart care, we have created designated Provider Care Teams.  These Care Teams include your primary Cardiologist (physician) and Advanced Practice Providers (APPs -  Physician Assistants and Nurse Practitioners) who all work together to provide you with the care you need, when you need it.     Your next appointment:   12 month(s)  The format for your next appointment:   In Person  Provider:  Glenetta Hew, MD   Other Instructions    Studies Ordered:   Orders Placed This Encounter  Procedures  . Flu Vaccine QUAD 36+ mos IM  . Lipid panel  . Comprehensive metabolic panel  . EKG 12-Lead     Glenetta Hew, M.D., M.S. Interventional Cardiologist   Pager # [phone] Phone # [phone] [address] [place], [place] [address]   Thank you for choosing Heartcare at [facility]!!

## 2020-03-07 NOTE — Patient Instructions (Signed)
Medication Instructions:  No changes  *If you need a refill on your cardiac medications before your next appointment, please call your pharmacy*   Lab Work:  If you have labs (blood work) drawn today and your tests are completely normal, you will receive your results only by: Marland Kitchen MyChart Message (if you have MyChart) OR . A paper copy in the mail If you have any lab test that is abnormal or we need to change your treatment, we will call you to review the results.   Testing/Procedures:  Not needed  Follow-Up: At [facility], you and your health needs are our priority.  As part of our continuing mission to provide you with exceptional heart care, we have created designated Provider Care Teams.  These Care Teams include your primary Cardiologist (physician) and Advanced Practice Providers (APPs -  Physician Assistants and Nurse Practitioners) who all work together to provide you with the care you need, when you need it.     Your next appointment:   12 month(s)  The format for your next appointment:   In Person  Provider:   Glenetta Hew, MD   Other Instructions

## 2020-03-17 ENCOUNTER — Encounter: Payer: Self-pay | Admitting: Cardiology

## 2020-03-17 NOTE — Assessment & Plan Note (Signed)
Doing well with no further anginal symptoms.  Is on aspirin, statin and beta-blocker stable doses.  He needs refill of his nitroglycerin, but he has not had to use it since his PCI.

## 2020-03-17 NOTE — Assessment & Plan Note (Addendum)
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

## 2020-03-17 NOTE — Assessment & Plan Note (Signed)
Labs as of last year were excellent.  Recheck labs today.  Continue to be excellent with LDL still less than 50 at 44.  A little bit higher than last year.  Plan: Continue current dose of statin.  Refill prescription of rosuvastatin 10 mg

## 2020-03-17 NOTE — Assessment & Plan Note (Addendum)
He says at home his pressures are more consistent with 120/80 where his hair is little higher.  Continue to monitor.  Next Plan: Continue current dose of carvedilol.  He did not tolerate higher doses in the past.

## 2020-03-17 NOTE — Assessment & Plan Note (Addendum)
Now 5 years out from inferior STEMI.  Culprit lesion was a total occlusion LCx-OM.  This was treated with DES following staged RCA PCI.  Preserved EF on follow-up echocardiogram.  No active angina or heart rate symptoms.  On stable meds.

## 2021-03-17 ENCOUNTER — Other Ambulatory Visit: Payer: Self-pay

## 2021-03-17 ENCOUNTER — Encounter: Payer: Self-pay | Admitting: Cardiology

## 2021-03-17 ENCOUNTER — Ambulatory Visit: Payer: BC Managed Care – PPO | Admitting: Cardiology

## 2021-03-17 VITALS — BP 126/80 | HR 64 | Ht 70.0 in | Wt 186.4 lb

## 2021-03-17 DIAGNOSIS — I2510 Atherosclerotic heart disease of native coronary artery without angina pectoris: Secondary | ICD-10-CM | POA: Diagnosis not present

## 2021-03-17 DIAGNOSIS — Z23 Encounter for immunization: Secondary | ICD-10-CM | POA: Diagnosis not present

## 2021-03-17 DIAGNOSIS — Z136 Encounter for screening for cardiovascular disorders: Secondary | ICD-10-CM

## 2021-03-17 DIAGNOSIS — I2129 ST elevation (STEMI) myocardial infarction involving other sites: Secondary | ICD-10-CM | POA: Diagnosis not present

## 2021-03-17 DIAGNOSIS — E785 Hyperlipidemia, unspecified: Secondary | ICD-10-CM

## 2021-03-17 DIAGNOSIS — I10 Essential (primary) hypertension: Secondary | ICD-10-CM | POA: Diagnosis not present

## 2021-03-17 DIAGNOSIS — Z9861 Coronary angioplasty status: Secondary | ICD-10-CM

## 2021-03-17 MED ORDER — CARVEDILOL 6.25 MG PO TABS: ORAL_TABLET | ORAL | 3 refills | Status: DC

## 2021-03-17 MED ORDER — ROSUVASTATIN CALCIUM 10 MG PO TABS: 10.0000 mg | ORAL_TABLET | Freq: Every day | ORAL | 3 refills | Status: DC

## 2021-03-17 MED ORDER — NITROGLYCERIN 0.4 MG SL SUBL: 0.4000 mg | SUBLINGUAL_TABLET | SUBLINGUAL | 6 refills | Status: DC | PRN

## 2021-03-17 NOTE — Patient Instructions (Signed)
Medication Instructions:  NO CHANGES *If you need a refill on your cardiac medications before your next appointment, please call your pharmacy*   Lab Work:  LIPID CBC CMP [id number] If you have labs (blood work) drawn today and your tests are completely normal, you will receive your results only by: [place] (if you have MyChart) OR A paper copy in the mail If you have any lab test that is abnormal or we need to change your treatment, we will call you to review the results.   Testing/Procedures: NOT NEEDED   Follow-Up: At [facility], you and your health needs are our priority.  As part of our continuing mission to provide you with exceptional heart care, we have created designated Provider Care Teams.  These Care Teams include your primary Cardiologist (physician) and Advanced Practice Providers (APPs -  Physician Assistants and Nurse Practitioners) who all work together to provide you with the care you need, when you need it.  We recommend signing up for the patient portal called "MyChart".  Sign up information is provided on this After Visit Summary.  MyChart is used to connect with patients for Virtual Visits (Telemedicine).  Patients are able to view lab/test results, encounter notes, upcoming appointments, etc.  Non-urgent messages can be sent to your provider as well.   To learn more about what you can do with MyChart, go to [email or web address].    Your next appointment:   12 month(s)  The format for your next appointment:   In Person  Provider:   Glenetta Hew, MD

## 2021-03-17 NOTE — Progress Notes (Signed)
Primary Care Provider: No primary care provider on file. Cardiologist: Glenetta Hew, MD Electrophysiologist: None  Clinic Note: Chief Complaint  Patient presents with   Follow-up    Annual   Coronary Artery Disease    No angina.    ===================================  ASSESSMENT/PLAN   Problem List Items Addressed This Visit       Cardiology Problems   ST elevation myocardial infarction (STEMI) of lateral wall, subsequent episode of care [facility]) (Chronic)    6 years out from his anterior STEMI.  He had total occlusion of the LCx and OM treated with DES stent and then a staged PCI of the RCA.  EF was preserved on echocardiogram.  He has not had any angina or heart failure symptoms since.  On stable regimen.  Continues to be very active.      Relevant Medications   carvedilol (COREG) 6.25 MG tablet   nitroGLYCERIN (NITROSTAT) 0.4 MG SL tablet   rosuvastatin (CRESTOR) 10 MG tablet   Other Relevant Orders   EKG 12-Lead (Completed)   Lipid panel (Completed)   Comprehensive metabolic panel (Completed)   CBC (Completed)   Hemoglobin A1c (Completed)   Atherosclerotic heart disease of native coronary artery without angina pectoris - Primary (Chronic)    Doing well.  No angina.  Reactive.  Two-vessel PCI.  On minimal medications including 81 mg aspirin, 625 mg daily carvedilol and 10 mg rosuvastatin.  Has not used glycerin since have known him.  No change.  Stable.      Relevant Medications   carvedilol (COREG) 6.25 MG tablet   nitroGLYCERIN (NITROSTAT) 0.4 MG SL tablet   rosuvastatin (CRESTOR) 10 MG tablet   Other Relevant Orders   EKG 12-Lead (Completed)   Lipid panel (Completed)   Comprehensive metabolic panel (Completed)   CBC (Completed)   Hemoglobin A1c (Completed)   Essential hypertension (Chronic)    Blood pressure remained stable.  He asked about how long he needs to be on carvedilol.  As of now, we will continue current dose long-term because his blood  pressures are well controlled and I explained that beta-blockers are cardioprotective.  No change.      Relevant Medications   carvedilol (COREG) 6.25 MG tablet   nitroGLYCERIN (NITROSTAT) 0.4 MG SL tablet   rosuvastatin (CRESTOR) 10 MG tablet   Other Relevant Orders   EKG 12-Lead (Completed)   Lipid panel (Completed)   Comprehensive metabolic panel (Completed)   CBC (Completed)   Hemoglobin A1c (Completed)   Hyperlipidemia with target LDL less than 70 (Chronic)    Lipids checked today.  LDL excellent at 52.  Well within goal of less than 55 based on new guidelines.. Doing well, tolerating low-dose rosuvastatin.  No change.      Relevant Medications   carvedilol (COREG) 6.25 MG tablet   nitroGLYCERIN (NITROSTAT) 0.4 MG SL tablet   rosuvastatin (CRESTOR) 10 MG tablet   Other Relevant Orders   EKG 12-Lead (Completed)   Lipid panel (Completed)   Comprehensive metabolic panel (Completed)   Hemoglobin A1c (Completed)   CAD S/P percutaneous coronary angioplasty (Chronic)    History of DES PCI LCx-OM and RCA-6.5 years out.  He is on maintenance aspirin (which can be held for procedures or surgery.  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       Relevant  Medications   carvedilol (COREG) 6.25 MG tablet   nitroGLYCERIN (NITROSTAT) 0.4 MG SL tablet   rosuvastatin (CRESTOR) 10 MG tablet   Other Relevant Orders   EKG 12-Lead (Completed)   Lipid panel (Completed)   Comprehensive metabolic panel (Completed)   CBC (Completed)   Hemoglobin A1c (Completed)   Other Visit Diagnoses     Encounter for screening for coronary artery disease       Relevant Orders   Hemoglobin A1c (Completed)   Need for immunization against influenza       Relevant Orders   Flu Vaccine QUAD 62mo+IM (Fluarix, Fluzone & Alfiuria Quad PF) (Completed)       ===================================  HPI:    Gordon Hayes is a 60 y.o. male with a PMH notable for CAD-PCI in setting of inferior STEMI (December 2016: PCI to RCA, LCx-OM) below who presents today for annual follow-up.  ANDRES VEST was last seen on 03/18/2020 for annual follow-up.  Doing well.  He had done well maintaining his smoking cessation.  Still doing all day working at the garage.  He does lots of walking around but between his jobs.  He also does yard work during the summer.  During the fall and winter he also does lots of mulching etc.  He did notice much less bruising after stopping Plavix.  Recent Hospitalizations: none  Reviewed  CV studies:    The following studies were reviewed today: (if available, images/films reviewed: From Epic Chart or Care Everywhere) None:  Interval History:   KYNGSTON PICKELSIMER returns very as usual doing remarkably well.  He is out and about more than he had been.  Getting more exercise.  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  He also enjoys doing his yard work still.  Not having any bruising.  Completely asymptomatic from a cardiac standpoint:  CV Review of Symptoms (Summary) Cardiovascular ROS: no chest pain or dyspnea on exertion negative for - edema, irregular heartbeat, orthopnea, palpitations, paroxysmal nocturnal dyspnea, rapid heart rate, shortness of breath, or lightheadedness, dizziness or wooziness, syncope/near syncope or TIA/amaurosis fugax, claudication.  REVIEWED OF SYSTEMS   Review of Systems  Constitutional:  Negative for malaise/fatigue and weight loss.  Respiratory:  Negative for cough and shortness of breath.   Cardiovascular:  Negative for claudication.  Gastrointestinal:  Negative for blood in stool and constipation.  Genitourinary:  Negative for hematuria.  Musculoskeletal:  Positive for joint pain (Hand arthritis pains). Negative for falls.  Neurological:  Negative for dizziness, focal weakness and headaches.   Psychiatric/Behavioral: Negative.     I have reviewed and (if needed) personally updated the patient's problem list, medications, allergies, past medical and surgical history, social and family history.   PAST MEDICAL HISTORY   Past Medical History:  Diagnosis Date   Back pain, lumbosacral    CAD S/P percutaneous coronary angioplasty 03/2015   OM2 99>0% w/ 2.75 mm x 16 mm (3.2 mm) Synergy DES; inferior OM2 branch 80%, med rx, pRCA 70%, mRCA 90% (s/p staged PCI w/ 2. 75 X 33 mm Xience Alpine DES);    Carpal tunnel syndrome, bilateral    COPD (chronic obstructive pulmonary disease) ([place]) 2013   DJD (degenerative joint disease) of knee    STEMI (ST elevation myocardial infarction) ([place])  03/31/2015   Subtotal OM2 - PCI with DES. EF 45-50%; Staged PCI of RCA    PAST SURGICAL HISTORY   Past Surgical History:  Procedure Laterality Date   CARDIAC CATHETERIZATION N/A 03/31/2015  Procedure: Left Heart Cath and Coronary Angiography;  Surgeon: Leonie Man, MD;  Location: [facility];  Service: Cardiovascular;  Subtotoal OM2, 85% RCA with diffuse upstream Dz. EF 45-50%   CARDIAC CATHETERIZATION N/A 03/31/2015   Procedure: Coronary Stent Intervention;  Surgeon: Leonie Man, MD;  Location: [facility];: OM2 99% - Synergy DES 2.75 mm x 16 mm (3.2 mm).    CARDIAC CATHETERIZATION N/A 04/02/2015   Procedure: Coronary Stent Intervention;  Surgeon: Wellington Hampshire, MD;  Location: [facility]: STAGED PCI  of RCA  90% - Xience DES 2.75 mm x 33 mm   HERNIA REPAIR     right inguinal   POSTERIOR LAMINECTOMY / DECOMPRESSION LUMBAR SPINE  2007   TONSILLECTOMY     TRANSTHORACIC ECHOCARDIOGRAM  06/2015   EF 55-60%, mild LVH. Normal wall motion. Gr 1 DD.    Post staged PCI 12 11/05/2014; 80% inferior branch.   Immunization History  Administered Date(s) Administered   Influenza Split 02/01/2012   Influenza,inj,Quad PF,6+ Mos 03/05/2019, 03/07/2020, 03/17/2021    Influenza-Unspecified 05/29/2016   PFIZER(Purple Top)SARS-COV-2 Vaccination 07/06/2019, 07/31/2019, 01/25/2020   Tdap 11/23/2011    MEDICATIONS/ALLERGIES   Current Meds  Medication Sig   aspirin EC 81 MG tablet Take 1 tablet (81 mg total) by mouth daily.   [DISCONTINUED] carvedilol (COREG) 6.25 MG tablet TAKE 6.25 MG  BY MOUTH  TWICE A DAY   [DISCONTINUED] nitroGLYCERIN (NITROSTAT) 0.4 MG SL tablet Place 1 tablet (0.4 mg total) under the tongue every 5 (five) minutes as needed for chest pain.   [DISCONTINUED] rosuvastatin (CRESTOR) 10 MG tablet Take 1 tablet (10 mg total) by mouth daily.    Allergies  Allergen Reactions   Penicillins Anaphylaxis   Atorvastatin Other (See Comments)    myalgias    SOCIAL HISTORY/FAMILY HISTORY   Reviewed in Epic:  Pertinent findings:  Social History   Tobacco Use   Smoking status: Former    Packs/day: 3.00    Years: 35.00    Pack years: 105.00    Types: Cigarettes    Quit date: 03/31/2015    Years since quitting: 6.0   Smokeless tobacco: Former   Tobacco comments:    trying to-"you gotta have some will power.  I don't have my mind right to do it yet."  Substance Use Topics   Alcohol use: Yes    Alcohol/week: 0.0 - 4.0 standard drinks   Drug use: No   Social History   Social History Narrative   Denied application for additional life insurance policy due to recent diagnosis of COPD.    OBJCTIVE -PE, EKG, labs   Wt Readings from Last 3 Encounters:  03/17/21 186 lb 6.4 oz (84.6 kg)  03/07/20 182 lb 9.6 oz (82.8 kg)  03/05/19 180 lb 12.8 oz (82 kg)    Physical Exam: BP 126/80 (BP Location: Right Arm)   Pulse 64   Ht 5\' 10"  (1.778 m)   Wt 186 lb 6.4 oz (84.6 kg)   SpO2 95%   BMI 26.75 kg/m  Physical Exam Constitutional:      General: He is not in acute distress.    Appearance: Normal appearance. He is normal weight. He is not toxic-appearing.  HENT:     Head: Normocephalic and atraumatic.  Neck:     Vascular: No carotid  bruit.  Cardiovascular:     Rate and Rhythm: Normal rate and regular rhythm.     Pulses: Normal pulses.  Heart sounds: Normal heart sounds. No murmur heard.   No friction rub. No gallop.  Pulmonary:     Effort: Pulmonary effort is normal. No respiratory distress.     Breath sounds: Normal breath sounds.  Chest:     Chest wall: No tenderness.  Abdominal:     General: Abdomen is flat. Bowel sounds are normal. There is no distension.     Palpations: Abdomen is soft.     Comments: No HSM or bruit  Musculoskeletal:        General: No swelling. Normal range of motion.     Cervical back: Normal range of motion and neck supple.     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  Skin:    General: Skin is warm and dry.  Neurological:     General: No focal deficit present.     Mental Status: He is alert and oriented to person, place, and time.     Cranial Nerves: No cranial nerve deficit.  Psychiatric:        Mood and Affect: Mood normal.        Behavior: Behavior normal.        Thought Content: Thought content normal.        Judgment: Judgment normal.     Adult ECG Report  Rate: 64 ;  Rhythm: normal sinus rhythm and incomplete RBBB.  Nonspecific ST and T wave changes.  Normal axis, intervals and durations. ;   Narrative Interpretation: Stable EKG.  Recent Labs: Reviewed.  Due for labs today. Lab Results  Component Value Date   CHOL 126 03/17/2021   HDL 62 03/17/2021   LDLCALC 52 03/17/2021   TRIG 56 03/17/2021   CHOLHDL 2.0 03/17/2021   Lab Results  Component Value Date   CREATININE 0.95 03/17/2021   BUN 12 03/17/2021   NA 140 03/17/2021   K 4.8 03/17/2021   CL 104 03/17/2021   CO2 25 03/17/2021   CBC Latest Ref Rng & Units 03/17/2021 11/03/2015 04/03/2015  WBC 3.4 - 10.8 x10E3/uL 6.7 7.2 6.6  Hemoglobin 13.0 - 17.7 g/dL 15.0 14.7 12.0(L)  Hematocrit 37.5 - 51.0 % 44.7 44.2 36.9(L)  Platelets 150 - 450 x10E3/uL 223 252 165    Lab Results   Component Value Date   HGBA1C 5.8 (H) 03/17/2021   Lab Results  Component Value Date   TSH 1.49 11/03/2015    ==================================================  COVID-19 Education: The signs and symptoms of COVID-19 were discussed with the patient and how to seek care for testing (follow up with PCP or arrange E-visit).    I spent a total of 19 minutes with the patient spent in direct patient consultation.  Additional time spent with chart review  / charting (studies, outside notes, etc): 15 min Total Time: 34 min  Current medicines are reviewed at length with the patient today.  (+/- concerns) none  This visit occurred during the SARS-CoV-2 public health emergency.  Safety protocols were in place, including screening questions prior to the visit, additional usage of staff PPE, and extensive cleaning of exam room while observing appropriate contact time as indicated for disinfecting solutions.  Notice: This dictation was prepared with Dragon dictation along with smart phrase technology. Any transcriptional errors that result from this process are unintentional and may not be corrected upon review.  Studies Ordered:   Orders Placed This Encounter  Procedures   Flu Vaccine QUAD 67mo+IM (Fluarix, Fluzone & Alfiuria Quad PF)   Lipid panel  Comprehensive metabolic panel   CBC   Hemoglobin A1c   EKG 12-Lead     Patient Instructions / Medication Changes & Studies & Tests Ordered   Patient Instructions  Medication Instructions:  NO CHANGES *If you need a refill on your cardiac medications before your next appointment, please call your pharmacy*   Lab Work:  LIPID CBC CMP HGBA1C If you have labs (blood work) drawn today and your tests are completely normal, you will receive your results only by: MyChart Message (if you have MyChart) OR A paper copy in the mail If you have any lab test that is abnormal or we need to change your treatment, we will call you to review the  results.   Testing/Procedures: NOT NEEDED   Follow-Up: At [facility], you and your health needs are our priority.  As part of our continuing mission to provide you with exceptional heart care, we have created designated Provider Care Teams.  These Care Teams include your primary Cardiologist (physician) and Advanced Practice Providers (APPs -  Physician Assistants and Nurse Practitioners) who all work together to provide you with the care you need, when you need it.  We recommend signing up for the patient portal called "MyChart".  Sign up information is provided on this After Visit Summary.  MyChart is used to connect with patients for Virtual Visits (Telemedicine).  Patients are able to view lab/test results, encounter notes, upcoming appointments, etc.  Non-urgent messages can be sent to your provider as well.   To learn more about what you can do with MyChart, go to [email or web address].    Your next appointment:   12 month(s)  The format for your next appointment:   In Person  Provider:   Glenetta Hew, MD           Glenetta Hew, M.D., M.S. Interventional Cardiologist   Pager # [phone] Phone # [phone] [address] [place], [place] [address]   Thank you for choosing Heartcare at [facility]!!

## 2021-03-18 LAB — COMPREHENSIVE METABOLIC PANEL
ALT: 14 IU/L (ref 0–44)
AST: 17 IU/L (ref 0–40)
Albumin/Globulin Ratio: 2 (ref 1.2–2.2)
Albumin: 4.3 g/dL (ref 3.8–4.9)
Alkaline Phosphatase: 60 IU/L (ref 44–121)
BUN/Creatinine Ratio: 13 (ref 9–20)
BUN: 12 mg/dL (ref 6–24)
Bilirubin Total: 0.3 mg/dL (ref 0.0–1.2)
CO2: 25 mmol/L (ref 20–29)
Calcium: 9 mg/dL (ref 8.7–10.2)
Chloride: 104 mmol/L (ref 96–106)
Creatinine, Ser: 0.95 mg/dL (ref 0.76–1.27)
Globulin, Total: 2.1 g/dL (ref 1.5–4.5)
Glucose: 75 mg/dL (ref 70–99)
Potassium: 4.8 mmol/L (ref 3.5–5.2)
Sodium: 140 mmol/L (ref 134–144)
Total Protein: 6.4 g/dL (ref 6.0–8.5)
eGFR: 92 mL/min/{1.73_m2} (ref >59)

## 2021-03-18 LAB — HEMOGLOBIN A1C
Est. average glucose Bld gHb Est-mCnc: 120 mg/dL
Hgb A1c MFr Bld: 5.8 % — ABNORMAL HIGH (ref 4.8–5.6)

## 2021-03-18 LAB — CBC
Hematocrit: 44.7 % (ref 37.5–51.0)
Hemoglobin: 15 g/dL (ref 13.0–17.7)
MCH: 30.4 pg (ref 26.6–33.0)
MCHC: 33.6 g/dL (ref 31.5–35.7)
MCV: 91 fL (ref 79–97)
Platelets: 223 10*3/uL (ref 150–450)
RBC: 4.93 x10E6/uL (ref 4.14–5.80)
RDW: 12.8 % (ref 11.6–15.4)
WBC: 6.7 10*3/uL (ref 3.4–10.8)

## 2021-03-18 LAB — LIPID PANEL
Chol/HDL Ratio: 2 ratio (ref 0.0–5.0)
Cholesterol, Total: 126 mg/dL (ref 100–199)
HDL: 62 mg/dL (ref >39)
LDL Chol Calc (NIH): 52 mg/dL (ref 0–99)
Triglycerides: 56 mg/dL (ref 0–149)
VLDL Cholesterol Cal: 12 mg/dL (ref 5–40)

## 2021-04-11 ENCOUNTER — Encounter: Payer: Self-pay | Admitting: Cardiology

## 2021-04-11 NOTE — Assessment & Plan Note (Signed)
6 years out from his anterior STEMI.  He had total occlusion of the LCx and OM treated with DES stent and then a staged PCI of the RCA.  EF was preserved on echocardiogram.  He has not had any angina or heart failure symptoms since.  On stable regimen.  Continues to be very active.

## 2021-04-11 NOTE — Assessment & Plan Note (Signed)
Blood pressure remained stable.  He asked about how long he needs to be on carvedilol.  As of now, we will continue current dose long-term because his blood pressures are well controlled and I explained that beta-blockers are cardioprotective.  No change.

## 2021-04-11 NOTE — Assessment & Plan Note (Signed)
Lipids checked today.  LDL excellent at 52.  Well within goal of less than 55 based on new guidelines.. Doing well, tolerating low-dose rosuvastatin.  No change.

## 2021-04-11 NOTE — Assessment & Plan Note (Addendum)
History of DES PCI LCx-OM and RCA-6.5 years out.  He is on maintenance aspirin (which can be held for procedures or surgery.  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

## 2021-04-11 NOTE — Assessment & Plan Note (Signed)
Doing well.  No angina.  Reactive.  Two-vessel PCI.  On minimal medications including 81 mg aspirin, 625 mg daily carvedilol and 10 mg rosuvastatin.  Has not used glycerin since have known him.  No change.  Stable.

## 2022-03-29 ENCOUNTER — Other Ambulatory Visit: Payer: Self-pay | Admitting: Cardiology

## 2022-04-06 ENCOUNTER — Encounter: Payer: Self-pay | Admitting: Cardiology

## 2022-04-06 ENCOUNTER — Ambulatory Visit: Payer: BC Managed Care – PPO | Attending: Cardiology | Admitting: Cardiology

## 2022-04-06 VITALS — BP 138/82 | HR 60 | Ht 70.0 in | Wt 188.2 lb

## 2022-04-06 DIAGNOSIS — I10 Essential (primary) hypertension: Secondary | ICD-10-CM

## 2022-04-06 DIAGNOSIS — E785 Hyperlipidemia, unspecified: Secondary | ICD-10-CM | POA: Diagnosis not present

## 2022-04-06 DIAGNOSIS — I2129 ST elevation (STEMI) myocardial infarction involving other sites: Secondary | ICD-10-CM | POA: Diagnosis not present

## 2022-04-06 DIAGNOSIS — I2510 Atherosclerotic heart disease of native coronary artery without angina pectoris: Secondary | ICD-10-CM

## 2022-04-06 DIAGNOSIS — Z9861 Coronary angioplasty status: Secondary | ICD-10-CM

## 2022-04-06 MED ORDER — ROSUVASTATIN CALCIUM 10 MG PO TABS: 10.0000 mg | ORAL_TABLET | Freq: Every day | ORAL | 3 refills | Status: DC

## 2022-04-06 MED ORDER — NITROGLYCERIN 0.4 MG SL SUBL: 0.4000 mg | SUBLINGUAL_TABLET | SUBLINGUAL | 6 refills | Status: DC | PRN

## 2022-04-06 MED ORDER — CARVEDILOL 6.25 MG PO TABS: ORAL_TABLET | ORAL | 3 refills | Status: DC

## 2022-04-06 NOTE — Assessment & Plan Note (Signed)
Doing remarkably well.  Back to his routine activities.  Now 7 years out from his MI and no major issues.  No angina or heart failure symptoms.  No bleeding issues.  All stable regimen of aspirin, low-dose rosuvastatin and moderate dose of carvedilol.  Check routine labs.

## 2022-04-06 NOTE — Assessment & Plan Note (Signed)
7 years out from MI.  Preserved EF with no heart failure or angina symptoms since staged PCI. Doing remarkably well on minimal medications.

## 2022-04-06 NOTE — Progress Notes (Signed)
Primary Care Provider: No primary care provider on file. [place] Cardiologist: Glenetta Hew, MD Electrophysiologist: None  Clinic Note: Chief Complaint  Patient presents with   Coronary Artery Disease    Doing Well - no problems   Follow-up    Annual   ===================================  ASSESSMENT/PLAN   Problem List Items Addressed This Visit       Cardiology Problems   ST elevation myocardial infarction (STEMI) of lateral wall, subsequent episode of care [facility]) (Chronic)    7 years out from MI.  Preserved EF with no heart failure or angina symptoms since staged PCI. Doing remarkably well on minimal medications.      Relevant Medications   carvedilol (COREG) 6.25 MG tablet   rosuvastatin (CRESTOR) 10 MG tablet   nitroGLYCERIN (NITROSTAT) 0.4 MG SL tablet   Atherosclerotic heart disease of native coronary artery without angina pectoris - Primary (Chronic)    Doing remarkably well.  Back to his routine activities.  Now 7 years out from his MI and no major issues.  No angina or heart failure symptoms.  No bleeding issues.  All stable regimen of aspirin, low-dose rosuvastatin and moderate dose of carvedilol.  Check routine labs.      Relevant Medications   carvedilol (COREG) 6.25 MG tablet   rosuvastatin (CRESTOR) 10 MG tablet   nitroGLYCERIN (NITROSTAT) 0.4 MG SL tablet   Other Relevant Orders   EKG 12-Lead   Lipid panel   Comprehensive metabolic panel   Hemoglobin A1c   Essential hypertension (Chronic)   Relevant Medications   carvedilol (COREG) 6.25 MG tablet   rosuvastatin (CRESTOR) 10 MG tablet   nitroGLYCERIN (NITROSTAT) 0.4 MG SL tablet   Other Relevant Orders   Lipid panel   Comprehensive metabolic panel   Hyperlipidemia with target LDL less than 70 (Chronic)    Due for annual follow-up check.  Last labs look great.  LDL was 53 with an A1c was 5.8.  Plan: Recheck lipids, CMP, A1c and CBC.      Relevant Medications   carvedilol  (COREG) 6.25 MG tablet   rosuvastatin (CRESTOR) 10 MG tablet   nitroGLYCERIN (NITROSTAT) 0.4 MG SL tablet   Other Relevant Orders   Lipid panel   Comprehensive metabolic panel   Hemoglobin A1c   CAD S/P percutaneous coronary angioplasty (Chronic)    He is now 7 years out from his PCI.  Now back on aspirin monotherapy.  Okay to hold aspirin if necessary 5 to 7 days preop for surgeries or procedures.      Relevant Medications   carvedilol (COREG) 6.25 MG tablet   rosuvastatin (CRESTOR) 10 MG tablet   nitroGLYCERIN (NITROSTAT) 0.4 MG SL tablet   Other Relevant Orders   EKG 12-Lead   Lipid panel   Comprehensive metabolic panel   Hemoglobin A1c    Overall doing remarkably well.  No active angina or heart failure symptoms.   No medication changes.   Check routine annual labs. Annual follow-up  ===================================  HPI:    Gordon Hayes is a 61 y.o. male with a PMH notable for CAD and PCI (Inferior STEMI December 2016-PCI to RCA, LCx-OM) who presents today for annual follow-up.  GAMAL TODISCO was last seen on March 17, 2021.  He is doing relatively well.  He was getting about more than usual.  Not getting more exercise.  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  Still enjoys doing yard work.  Less notable bruising.  Completely  asymptomatic regarding standpoint.  Recent Hospitalizations: None  Reviewed  CV studies:    The following studies were reviewed today: (if available, images/films reviewed: From Epic Chart or Care Everywhere) None:  Interval History:   Gordon Hayes reutrns today doing well ~ 7 years out from MI.  Still working full time in the garage - 6d/wk.  Does a lot of walking during the day @ work.  Up @ 4 AM to get going dong chores.  Drinks water all day long - no more sodas.  BP @ home usually in 120s/70s.    CV Review of Symptoms (Summary):  no chest pain or dyspnea on exertion negative for - edema, irregular heartbeat, murmur, orthopnea,  palpitations, paroxysmal nocturnal dyspnea, rapid heart rate, shortness of breath, or syncope/near syncope; TIA/amaurosis fugax, melena/hematochezia, hematuria, epistaxis.  REVIEWED OF SYSTEMS   Pertient + & Negative not listed above. --> No recent illness -- None of the URI Sx of this Fall, No COVID/Flu etc.   ++ Hand arthritis  I have reviewed and (if needed) personally updated the patient's problem list, medications, allergies, past medical and surgical history, social and family history.   PAST MEDICAL HISTORY   Past Medical History:  Diagnosis Date   Back pain, lumbosacral    CAD S/P percutaneous coronary angioplasty 03/2015   OM2 99>0% w/ 2.75 mm x 16 mm (3.2 mm) Synergy DES; inferior OM2 branch 80%, med rx, pRCA 70%, mRCA 90% (s/p staged PCI w/ 2. 75 X 33 mm Xience [place] DES);    Carpal tunnel syndrome, bilateral    COPD (chronic obstructive pulmonary disease) ([place]) 2013   DJD (degenerative joint disease) of knee    STEMI (ST elevation myocardial infarction) ([place])  03/31/2015   Subtotal OM2 - PCI with DES. EF 45-50%; Staged PCI of RCA    PAST SURGICAL HISTORY   Past Surgical History:  Procedure Laterality Date   CARDIAC CATHETERIZATION N/A 03/31/2015   Procedure: Left Heart Cath and Coronary Angiography;  Surgeon: Leonie Man, MD;  Location: [place] [facility];  Service: Cardiovascular;  Subtotoal OM2, 85% RCA with diffuse upstream Dz. EF 45-50%   CARDIAC CATHETERIZATION N/A 03/31/2015   Procedure: Coronary Stent Intervention;  Surgeon: Leonie Man, MD;  Location: [place] [facility];: OM2 99% - Synergy DES 2.75 mm x 16 mm (3.2 mm).    CARDIAC CATHETERIZATION N/A 04/02/2015   Procedure: Coronary Stent Intervention;  Surgeon: Wellington Hampshire, MD;  Location: [facility]: STAGED PCI  of RCA  90% - Xience DES 2.75 mm x 33 mm   HERNIA REPAIR     right inguinal   POSTERIOR LAMINECTOMY / DECOMPRESSION LUMBAR SPINE  2007   TONSILLECTOMY     TRANSTHORACIC  ECHOCARDIOGRAM  06/2015   EF 55-60%, mild LVH. Normal wall motion. Gr 1 DD.    Post staged PCI 12 11/05/2014; 80% inferior branch.   Immunization History  Administered Date(s) Administered   Influenza Split 02/01/2012   Influenza,inj,Quad PF,6+ Mos 03/05/2019, 03/07/2020, 03/17/2021   Influenza-Unspecified 05/29/2016   PFIZER(Purple Top)SARS-COV-2 Vaccination 07/06/2019, 07/31/2019, 01/25/2020   Tdap 11/23/2011    MEDICATIONS/ALLERGIES   Current Meds  Medication Sig   aspirin EC 81 MG tablet Take 1 tablet (81 mg total) by mouth daily.   [Refilled] carvedilol (COREG) 6.25 MG tablet TAKE 1 TABLET BY MOUTH TWICE A DAY   [Refilled] nitroGLYCERIN (NITROSTAT) 0.4 MG SL tablet Place 1 tablet (0.4 mg total) under the tongue every 5 (five)  minutes as needed for chest pain.   [Refilled] rosuvastatin (CRESTOR) 10 MG tablet TAKE 1 TABLET (10 MG TOTAL) BY MOUTH DAILY.    Allergies  Allergen Reactions   Penicillins Anaphylaxis   Atorvastatin Other (See Comments)    myalgias    SOCIAL HISTORY/FAMILY HISTORY   Reviewed in Epic:  Pertinent findings:  Social History   Tobacco Use   Smoking status: Former    Packs/day: 3.00    Years: 35.00    Total pack years: 105.00    Types: Cigarettes    Quit date: 03/31/2015    Years since quitting: 7.0   Smokeless tobacco: Former   Tobacco comments:    trying to-"you gotta have some will power.  I don't have my mind right to do it yet."  Substance Use Topics   Alcohol use: Yes    Alcohol/week: 0.0 - 4.0 standard drinks of alcohol   Drug use: No   Social History   Social History Narrative   Denied application for additional life insurance policy due to recent diagnosis of COPD.    OBJCTIVE -PE, EKG, labs   Wt Readings from Last 3 Encounters:  04/06/22 188 lb 3.2 oz (85.4 kg)  03/17/21 186 lb 6.4 oz (84.6 kg)  03/07/20 182 lb 9.6 oz (82.8 kg)    Physical Exam: BP 138/82   Pulse 60   Ht '5\' 10"'$  (1.778 m)   Wt 188 lb 3.2 oz (85.4 kg)    SpO2 95%   BMI 27.00 kg/m  Physical Exam Vitals reviewed.  Constitutional:      General: He is not in acute distress.    Appearance: Normal appearance. He is normal weight. He is not ill-appearing or toxic-appearing.  HENT:     Head: Normocephalic and atraumatic.  Neck:     Vascular: No carotid bruit or JVD.  Cardiovascular:     Rate and Rhythm: Normal rate and regular rhythm. No extrasystoles are present.    Chest Wall: PMI is not displaced.     Pulses: Normal pulses.     Heart sounds: Normal heart sounds, S1 normal and S2 normal. No murmur heard.    No friction rub. No gallop.  Pulmonary:     Effort: Pulmonary effort is normal. No respiratory distress.     Breath sounds: Normal breath sounds. No wheezing, rhonchi or rales.  Chest:     Chest wall: No tenderness.  Musculoskeletal:        General: No swelling. Normal range of motion.     Cervical back: Normal range of motion and neck supple.     Comments: Nodular arthritis on both hands MTP and PT  Skin:    General: Skin is warm and dry.  Neurological:     General: No focal deficit present.     Mental Status: He is alert and oriented to person, place, and time.     Gait: Gait normal.  Psychiatric:        Mood and Affect: Mood normal.        Behavior: Behavior normal.        Thought Content: Thought content normal.        Judgment: Judgment normal.     Adult ECG Report  Rate: 60 ;  Rhythm: normal sinus rhythm and IRBBB;  normal axis, intervals & durations ;   Narrative Interpretation: stable  Recent Labs:  Needs new labs.   Lab Results  Component Value Date   CHOL 126 03/17/2021   HDL  62 03/17/2021   LDLCALC 52 03/17/2021   TRIG 56 03/17/2021   CHOLHDL 2.0 03/17/2021   Lab Results  Component Value Date   CREATININE 0.95 03/17/2021   BUN 12 03/17/2021   NA 140 03/17/2021   K 4.8 03/17/2021   CL 104 03/17/2021   CO2 25 03/17/2021      Latest Ref Rng & Units 03/17/2021    2:27 PM 11/03/2015    8:50 AM  04/03/2015    2:10 AM  CBC  WBC 3.4 - 10.8 x10E3/uL 6.7  7.2  6.6   Hemoglobin 13.0 - 17.7 g/dL 15.0  14.7  12.0   Hematocrit 37.5 - 51.0 % 44.7  44.2  36.9   Platelets 150 - 450 x10E3/uL 223  252  165     Lab Results  Component Value Date   HGBA1C 5.8 (H) 03/17/2021   Lab Results  Component Value Date   TSH 1.49 11/03/2015    ================================================== I spent a total of 38mnutes with the patient spent in direct patient consultation.  Additional time spent with chart review  / charting (studies, outside notes, etc): 8 min Total Time: 32 min  Current medicines are reviewed at length with the patient today.  (+/- concerns) n/a  Notice: This dictation was prepared with Dragon dictation along with smart phrase technology. Any transcriptional errors that result from this process are unintentional and may not be corrected upon review.  Studies Ordered:   Orders Placed This Encounter  Procedures   Lipid panel   Comprehensive metabolic panel   Hemoglobin A1c   EKG 12-Lead   Meds ordered this encounter  Medications   carvedilol (COREG) 6.25 MG tablet    Sig: TAKE 1 TABLET BY MOUTH TWICE A DAY    Dispense:  180 tablet    Refill:  3   rosuvastatin (CRESTOR) 10 MG tablet    Sig: Take 1 tablet (10 mg total) by mouth daily.    Dispense:  90 tablet    Refill:  3   nitroGLYCERIN (NITROSTAT) 0.4 MG SL tablet    Sig: Place 1 tablet (0.4 mg total) under the tongue every 5 (five) minutes as needed for chest pain.    Dispense:  25 tablet    Refill:  6    Place on file - doesn't need now    Patient Instructions / Medication Changes & Studies & Tests Ordered   Patient Instructions  Medication Instructions:  Not needed  *If you need a refill on your cardiac medications before your next appointment, please call your pharmacy*   Lab Work: Cmp Lipid hgba1c    Testing/Procedures:  Not needed  Follow-Up: [facility] you and your health  needs are our priority.  As part of our continuing mission to provide you with exceptional heart care, we have created designated Provider Care Teams.  These Care Teams include your primary Cardiologist (physician) and Advanced Practice Providers (APPs -  Physician Assistants and Nurse Practitioners) who all work together to provide you with the care you need, when you need it.     Your next appointment:   12 month(s)  The format for your next appointment:   In Person  Provider:   DGlenetta Hew MD       DLeonie Man MD, MS DGlenetta Hew M.D., M.S. Interventional Cardiologist  [facility] Pager # [phone] # [phone] [place] [address]  Thank you for choosing [facility] [employer]!!

## 2022-04-06 NOTE — Assessment & Plan Note (Signed)
He is now 7 years out from his PCI.  Now back on aspirin monotherapy.  Okay to hold aspirin if necessary 5 to 7 days preop for surgeries or procedures.

## 2022-04-06 NOTE — Assessment & Plan Note (Signed)
Due for annual follow-up check.  Last labs look great.  LDL was 53 with an A1c was 5.8.  Plan: Recheck lipids, CMP, A1c and CBC.

## 2022-04-06 NOTE — Patient Instructions (Signed)
Medication Instructions:  Not needed  *If you need a refill on your cardiac medications before your next appointment, please call your pharmacy*   Lab Work: Cmp Lipid hgba1c    Testing/Procedures:  Not needed  Follow-Up: At [facility], you and your health needs are our priority.  As part of our continuing mission to provide you with exceptional heart care, we have created designated Provider Care Teams.  These Care Teams include your primary Cardiologist (physician) and Advanced Practice Providers (APPs -  Physician Assistants and Nurse Practitioners) who all work together to provide you with the care you need, when you need it.     Your next appointment:   12 month(s)  The format for your next appointment:   In Person  Provider:   Glenetta Hew, MD

## 2022-04-07 LAB — COMPREHENSIVE METABOLIC PANEL
ALT: 18 IU/L (ref 0–44)
AST: 17 IU/L (ref 0–40)
Albumin/Globulin Ratio: 1.8 (ref 1.2–2.2)
Albumin: 4.2 g/dL (ref 3.8–4.9)
Alkaline Phosphatase: 59 IU/L (ref 44–121)
BUN/Creatinine Ratio: 16 (ref 10–24)
BUN: 16 mg/dL (ref 8–27)
Bilirubin Total: 0.3 mg/dL (ref 0.0–1.2)
CO2: 25 mmol/L (ref 20–29)
Calcium: 8.9 mg/dL (ref 8.6–10.2)
Chloride: 105 mmol/L (ref 96–106)
Creatinine, Ser: 0.98 mg/dL (ref 0.76–1.27)
Globulin, Total: 2.3 g/dL (ref 1.5–4.5)
Glucose: 83 mg/dL (ref 70–99)
Potassium: 4.8 mmol/L (ref 3.5–5.2)
Sodium: 141 mmol/L (ref 134–144)
Total Protein: 6.5 g/dL (ref 6.0–8.5)
eGFR: 88 mL/min/{1.73_m2} (ref >59)

## 2022-04-07 LAB — LIPID PANEL
Chol/HDL Ratio: 2.1 ratio (ref 0.0–5.0)
Cholesterol, Total: 120 mg/dL (ref 100–199)
HDL: 56 mg/dL
LDL Chol Calc (NIH): 52 mg/dL (ref 0–99)
Triglycerides: 52 mg/dL (ref 0–149)
VLDL Cholesterol Cal: 12 mg/dL (ref 5–40)

## 2022-04-07 LAB — HEMOGLOBIN A1C
Est. average glucose Bld gHb Est-mCnc: 126 mg/dL
Hgb A1c MFr Bld: 6 % — ABNORMAL HIGH (ref 4.8–5.6)

## 2023-04-07 ENCOUNTER — Other Ambulatory Visit: Payer: Self-pay | Admitting: Nurse Practitioner

## 2023-04-07 ENCOUNTER — Encounter: Payer: Self-pay | Admitting: Nurse Practitioner

## 2023-04-07 ENCOUNTER — Ambulatory Visit: Payer: BC Managed Care – PPO | Attending: Nurse Practitioner | Admitting: Nurse Practitioner

## 2023-04-07 VITALS — BP 136/74 | HR 56 | Ht 70.0 in | Wt 185.4 lb

## 2023-04-07 DIAGNOSIS — R001 Bradycardia, unspecified: Secondary | ICD-10-CM | POA: Diagnosis not present

## 2023-04-07 DIAGNOSIS — E785 Hyperlipidemia, unspecified: Secondary | ICD-10-CM

## 2023-04-07 DIAGNOSIS — I2510 Atherosclerotic heart disease of native coronary artery without angina pectoris: Secondary | ICD-10-CM

## 2023-04-07 DIAGNOSIS — I10 Essential (primary) hypertension: Secondary | ICD-10-CM

## 2023-04-07 MED ORDER — CARVEDILOL 6.25 MG PO TABS: ORAL_TABLET | ORAL | 3 refills | Status: DC

## 2023-04-07 MED ORDER — ROSUVASTATIN CALCIUM 10 MG PO TABS: 10.0000 mg | ORAL_TABLET | Freq: Every day | ORAL | 3 refills | Status: DC

## 2023-04-07 NOTE — Patient Instructions (Signed)
Medication Instructions:  Your physician recommends that you continue on your current medications as directed. Please refer to the Current Medication list given to you today.  *If you need a refill on your cardiac medications before your next appointment, please call your pharmacy*   Lab Work: Fasting Lipid panel & CMET   Testing/Procedures: NONE ordered at this time of appointment     Follow-Up: At [facility], you and your health needs are our priority.  As part of our continuing mission to provide you with exceptional heart care, we have created designated Provider Care Teams.  These Care Teams include your primary Cardiologist (physician) and Advanced Practice Providers (APPs -  Physician Assistants and Nurse Practitioners) who all work together to provide you with the care you need, when you need it.  We recommend signing up for the patient portal called "MyChart".  Sign up information is provided on this After Visit Summary.  MyChart is used to connect with patients for Virtual Visits (Telemedicine).  Patients are able to view lab/test results, encounter notes, upcoming appointments, etc.  Non-urgent messages can be sent to your provider as well.   To learn more about what you can do with MyChart, go to [email or web address].    Your next appointment:   1 year(s)  Provider:   Bryan Lemma, MD     Other Instructions

## 2023-04-07 NOTE — Progress Notes (Signed)
Office Visit    Patient Name: Gordon Hayes Date of Encounter: 04/07/2023  Primary Care Provider:  Patient, No Pcp Per Primary Cardiologist:  Bryan Lemma, MD  Chief Complaint    62 year old male with a history of CAD s/p STEMI, DES-LCx-OM2 and DES-RCA in 2016, hypertension, and hyperlipidemia who presents for follow-up related to CAD.  Past Medical History    Past Medical History:  Diagnosis Date   Back pain, lumbosacral    CAD S/P percutaneous coronary angioplasty 03/2015   OM2 99>0% w/ 2.75 mm x 16 mm (3.2 mm) Synergy DES; inferior OM2 branch 80%, med rx, pRCA 70%, mRCA 90% (s/p staged PCI w/ 2. 75 X 33 mm Xience Alpine DES);    Carpal tunnel syndrome, bilateral    COPD (chronic obstructive pulmonary disease) (HCC) 2013   DJD (degenerative joint disease) of knee    STEMI (ST elevation myocardial infarction) (HCC)  03/31/2015   Subtotal OM2 - PCI with DES. EF 45-50%; Staged PCI of RCA   Past Surgical History:  Procedure Laterality Date   CARDIAC CATHETERIZATION N/A 03/31/2015   Procedure: Left Heart Cath and Coronary Angiography;  Surgeon: Marykay Lex, MD;  Location: [facility] INVASIVE CV LAB;  Service: Cardiovascular;  Subtotoal OM2, 85% RCA with diffuse upstream Dz. EF 45-50%   CARDIAC CATHETERIZATION N/A 03/31/2015   Procedure: Coronary Stent Intervention;  Surgeon: Marykay Lex, MD;  Location: [facility] INVASIVE CV LAB;: OM2 99% - Synergy DES 2.75 mm x 16 mm (3.2 mm).    CARDIAC CATHETERIZATION N/A 04/02/2015   Procedure: Coronary Stent Intervention;  Surgeon: Iran Ouch, MD;  Location: [facility] INVASIVE CV LAB: STAGED PCI  of RCA  90% - Xience DES 2.75 mm x 33 mm   HERNIA REPAIR     right inguinal   POSTERIOR LAMINECTOMY / DECOMPRESSION LUMBAR SPINE  2007   TONSILLECTOMY     TRANSTHORACIC ECHOCARDIOGRAM  06/2015   EF 55-60%, mild LVH. Normal wall motion. Gr 1 DD.     Allergies  Allergies  Allergen Reactions   Penicillins Anaphylaxis   Atorvastatin Other (See Comments)     myalgias     Labs/Other Studies Reviewed    The following studies were reviewed today:  Cardiac Studies & Procedures   CARDIAC CATHETERIZATION  CARDIAC CATHETERIZATION 03/31/2015  Narrative Images from the original result were not included. 1. Severe 2 vessel disease involving OM 2 and mid RCA. 2. 2nd Mrg lesion, 99% stenosed. PCI performed with synergy DES 2.75 mm x 16 mm (3.2 mm). Post intervention, there is a 0% residual stenosis. 3. Lat 2nd Mrg lesion, 80% stenosed. 4. Mid RCA lesion, 85% stenosed. 5. There is mild left ventricular systolic dysfunction.   Successful PCI of what appears to be the culprit lesion based on patient having resolution of his anginal symptoms and improved EKG findings. He does still have residual. Lesion in the mid RCA. The inferior branch of OM 2 does have an ostial lesion, but this was felt to be too small and too ostial branch vessel for PCI/PTCA. Will treat medically.   Plan:  Admit to CCU for standard post radial cath care.  Restart heparin 6 hours post TR band removal  Anticipate staged PCI of severe flow-limiting lesion of mid RCA on Wednesday.  Dual antiplatelet therapy for minimum one year (would be okay potentially to stop aspirin after 3 months for bleeding issues)  Expected discharge on the following days PCI.    Marykay Lex, M.D., M.S.  Interventional Cardiologist  Pager # [phone]  Findings Coronary Findings Diagnostic  Dominance: Right  Left Main Vessel was injected. Vessel is large. Vessel is angiographically normal. TIG 4.0 Catheter  Left Anterior Descending  First Diagonal Branch The vessel is moderate in size.  First Septal Branch The vessel is moderate in size.  Second Diagonal Branch The vessel is small in size.  Second Septal Branch The vessel is small in size.  Third Septal Branch The vessel is small in size.  Left Circumflex  First Obtuse Marginal Branch The vessel is small in  size.  Second Obtuse Marginal Branch Thrombotic tubular eccentric ulcerative.  Lateral Second Obtuse Marginal Branch The vessel is small in size. Discrete located at the major branch.  Right Coronary Artery . Vessel is large.  Right Posterior Descending Artery The vessel is large in size.  Right Posterior Atrioventricular Artery The vessel is moderate in size.  Intervention  2nd Mrg lesion PCI The pre-interventional distal flow is decreased (TIMI 2). Pre-stent angioplasty was performed. A drug-eluting stent was placed. Minimum lumen area: 3.2 mm. The strut is apposed. Post-stent angioplasty was performed. Lesion length: 14 mm. Maximum pressure: 20 atm. The post-interventional distal flow is normal (TIMI 3). The intervention was successful. No complications occurred at this lesion. CATH VISTA GUIDE 6FR XBLAD3.5 ([id number]) WIRE ASAHI PROWATER 180CM [phone]) Supplies used: Ree Kida RX 2.5X15; STENT SYNERGY DES 2.75X16; BALLN [place] EMERGE MR 3.0X8 There is a 0% residual stenosis post intervention.   CARDIAC CATHETERIZATION  CARDIAC CATHETERIZATION 04/02/2015  Narrative  Lat 2nd Mrg lesion, 80% stenosed.  Mid RCA lesion, 90% stenosed. Post intervention, there is a 0% residual stenosis.  Successful angioplasty and drug-eluting stent placement to the mid and proximal right coronary artery. The severe stenosis was in the mid RCA but there is diffuse 70% disease proximally. The stent was postdilated with a 3.5 noncompliant balloon to high pressure. A small RV branch was jailed by the stent with sluggish flow. The patient did have mild chest pain that resolved after about 15 minutes.  Recommendations: Dual antiplatelet therapy for at least one year. Aggressive treatment of risk factors.  Findings Coronary Findings Diagnostic  Dominance: Right  Left Main . Vessel is large. Vessel is angiographically normal.  Left Anterior Descending  First Diagonal Branch The vessel is  moderate in size.  First Septal Branch The vessel is moderate in size.  Second Diagonal Branch The vessel is small in size.  Second Septal Branch The vessel is small in size.  Third Septal Branch The vessel is small in size.  Left Circumflex  First Obtuse Marginal Branch The vessel is small in size.  Second Obtuse Marginal Branch Thrombotic tubular eccentric ulcerative. Previously placed 2nd Mrg drug eluting stent is patent.  Lateral Second Obtuse Marginal Branch The vessel is small in size. Discrete located at the major branch.  Right Coronary Artery . Vessel is large.  Right Posterior Descending Artery The vessel is large in size.  Right Posterior Atrioventricular Artery The vessel is moderate in size.  Intervention  Mid RCA lesion PCI The pre-interventional distal flow is normal (TIMI 3). Pre-stent angioplasty was performed. An unspecified stent was placed. The strut is apposed. Post-stent angioplasty was performed. Maximum pressure: 20 atm. The post-interventional distal flow is normal (TIMI 3). The intervention was successful. No complications occurred at this lesion. Supplies used: STENT XIENCE ALPINE RX [id number] There is a 0% residual stenosis post intervention.    ECHOCARDIOGRAM  ECHOCARDIOGRAM COMPLETE 07/14/2015  Narrative *[facility]  [facility]* [address] [place], [place] [address] [phone]  ------------------------------------------------------------------- Transthoracic Echocardiography  Patient:    Jaidan, Fagot MR #:       [id number] Study Date: 07/14/2015 Gender:     M Age:        54 Height:     177.8 cm Weight:     86.6 kg BSA:        2.08 m^2 Pt. Status: Room:  ATTENDING    Barrett, Rae Lips SONOGRAPHER  Dewitt Hoes, RDCS PERFORMING   [facility], Outpatient  cc:  ------------------------------------------------------------------- LV EF: 55% -    60%  ------------------------------------------------------------------- Indications:      Ischemic cardiomyopathy (I25.5).  ------------------------------------------------------------------- History:   PMH:   Coronary artery disease.  Chronic obstructive pulmonary disease.  PMH:   Myocardial infarction.  Risk factors: Former tobacco use. Hypertension.  ------------------------------------------------------------------- Study Conclusions  - Left ventricle: The cavity size was mildly dilated. Wall thickness was increased in a pattern of mild LVH. Systolic function was normal. The estimated ejection fraction was in the range of 55% to 60%. Wall motion was normal; there were no regional wall motion abnormalities. Doppler parameters are consistent with abnormal left ventricular relaxation (grade 1 diastolic dysfunction).  Impressions:  - Normal LV systolic function; mild LVE; mild LVH; grade 1 diastolic dysfunction; trace MR and TR.  Transthoracic echocardiography.  M-mode, complete 2D, spectral Doppler, and color Doppler.  Birthdate:  Patient birthdate: 16-Jan-1961.  Age:  Patient is 62 yr old.  Sex:  Gender: male. BMI: 27.4 kg/m^2.  Blood pressure:     100/60  Patient status: Outpatient.  Study date:  Study date: 07/14/2015. Study time: 07:40 AM.  Location:  [facility]  -------------------------------------------------------------------  ------------------------------------------------------------------- Left ventricle:  The cavity size was mildly dilated. Wall thickness was increased in a pattern of mild LVH. Systolic function was normal. The estimated ejection fraction was in the range of 55% to 60%. Wall motion was normal; there were no regional wall motion abnormalities. Doppler parameters are consistent with abnormal left ventricular relaxation (grade 1 diastolic dysfunction).  ------------------------------------------------------------------- Aortic valve:    Trileaflet; mildly thickened leaflets. Mobility was not restricted.  Doppler:  Transvalvular velocity was within the normal range. There was no stenosis. There was no regurgitation.  ------------------------------------------------------------------- Aorta:  Aortic root: The aortic root was normal in size.  ------------------------------------------------------------------- Mitral valve:   Structurally normal valve.   Mobility was not restricted.  Doppler:  Transvalvular velocity was within the normal range. There was no evidence for stenosis. There was trivial regurgitation.    Peak gradient (D): 2 mm Hg.  ------------------------------------------------------------------- Left atrium:  The atrium was normal in size.  ------------------------------------------------------------------- Right ventricle:  The cavity size was normal. Systolic function was normal.  ------------------------------------------------------------------- Pulmonic valve:    Doppler:  Transvalvular velocity was within the normal range. There was no evidence for stenosis.  ------------------------------------------------------------------- Tricuspid valve:   Structurally normal valve.    Doppler: Transvalvular velocity was within the normal range. There was trivial regurgitation.  ------------------------------------------------------------------- Right atrium:  The atrium was normal in size.  ------------------------------------------------------------------- Pericardium:  There was no pericardial effusion.  ------------------------------------------------------------------- Systemic veins: Inferior vena cava: The vessel was normal in size.  ------------------------------------------------------------------- Measurements  Left ventricle                           Value  Reference LV ID, ED, PLAX chordal          (H)     54.5  mm     43 - 52 LV ID, ES, PLAX chordal                  36.7  mm     23  - 38 LV fx shortening, PLAX chordal           33    %      >=29 LV PW thickness, ED                      11.45 mm     --------- IVS/LV PW ratio, ED                      1.06         <=1.3 Stroke volume, 2D                        86    ml     --------- Stroke volume/bsa, 2D                    41    ml/m^2 --------- LV ejection fraction, 1-p A4C            64    %      --------- LV end-diastolic volume, 2-p             96    ml     --------- LV end-systolic volume, 2-p              41    ml     --------- LV ejection fraction, 2-p                57    %      --------- Stroke volume, 2-p                       55    ml     --------- LV end-diastolic volume/bsa, 2-p         46    ml/m^2 --------- LV end-systolic volume/bsa, 2-p          20    ml/m^2 --------- Stroke volume/bsa, 2-p                   26.4  ml/m^2 --------- LV e&', lateral                           9.74  cm/s   --------- LV E/e&', lateral                         7.74         --------- LV e&', medial                            8.69  cm/s   --------- LV E/e&', medial                          8.68         --------- LV e&', average  9.22  cm/s   --------- LV E/e&', average                         8.18         ---------  Ventricular septum                       Value        Reference IVS thickness, ED                        12.09 mm     ---------  LVOT                                     Value        Reference LVOT ID, S                               22    mm     --------- LVOT area                                3.8   cm^2   --------- LVOT peak velocity, S                    107   cm/s   --------- LVOT mean velocity, S                    69.6  cm/s   --------- LVOT VTI, S                              22.5  cm     ---------  Aorta                                    Value        Reference Aortic root ID, ED                       36    mm     ---------  Left atrium                              Value         Reference LA ID, A-P, ES                           48    mm     --------- LA ID/bsa, A-P                   (H)     2.3   cm/m^2 <=2.2 LA volume, S                             68    ml     --------- LA volume/bsa, S  32.6  ml/m^2 --------- LA volume, ES, 1-p A4C                   59    ml     --------- LA volume/bsa, ES, 1-p A4C               28.3  ml/m^2 --------- LA volume, ES, 1-p A2C                   71    ml     --------- LA volume/bsa, ES, 1-p A2C               34.1  ml/m^2 ---------  Mitral valve                             Value        Reference Mitral E-wave peak velocity              75.4  cm/s   --------- Mitral A-wave peak velocity              72.7  cm/s   --------- Mitral deceleration time         (L)     141   ms     150 - 230 Mitral peak gradient, D                  2     mm Hg  --------- Mitral E/A ratio, peak                   1            ---------  Systemic veins                           Value        Reference Estimated CVP                            3     mm Hg  ---------  Right ventricle                          Value        Reference RV s&', lateral, S                        17    cm/s   ---------  Legend: (L)  and  (H)  mark values outside specified reference range.  ------------------------------------------------------------------- Prepared and Electronically Authenticated by  Olga Millers 2017-03-20T08:34:45            Recent Labs: No results found for requested labs within last 365 days.  Recent Lipid Panel    Component Value Date/Time   CHOL 120 04/06/2022 0842   TRIG 52 04/06/2022 0842   HDL 56 04/06/2022 0842   CHOLHDL 2.1 04/06/2022 0842   CHOLHDL 2.2 11/03/2015 0850   VLDL 20 11/03/2015 0850   LDLCALC 52 04/06/2022 0842    History of Present Illness    62 year old male  with the above past medical history including CAD s/p STEMI, DES-LCx-OM2 and DES-RCA in 2016, hypertension, and hyperlipidemia.   He was  hospitalized in December 2016 in the setting of STEMI. He underwent PCI/DES-LCx/OM 2, and DES-RCA, EF was 45 to 50% at the  time. He was noted to have 80% stenosis to the inferior branch of OM 2, this was considered too small for PCI. Echocardiogram in 06/2015 showed EF improved to 55 to 60%, normal LV function, no RWMA, G1 DD, mild LVH, no significant valvular abnormalities. He was last seen in office on 04/06/2022 and was doing well from a cardiac standpoint. He denies symptoms concerning for angina.  He presents today for follow-up. Since his last visit he has done well from a cardiac standpoint. He denies any symptoms concerning for angina.  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 Overall, he reports feeling well.  Home Medications    Current Outpatient Medications  Medication Sig Dispense Refill   aspirin EC 81 MG tablet Take 1 tablet (81 mg total) by mouth daily. 90 tablet 3   nitroGLYCERIN (NITROSTAT) 0.4 MG SL tablet Place 1 tablet (0.4 mg total) under the tongue every 5 (five) minutes as needed for chest pain. 25 tablet 6   carvedilol (COREG) 6.25 MG tablet TAKE 1 TABLET BY MOUTH TWICE A DAY 180 tablet 3   rosuvastatin (CRESTOR) 10 MG tablet Take 1 tablet (10 mg total) by mouth daily. 90 tablet 3   No current facility-administered medications for this visit.     Review of Systems    He denies chest pain, palpitations, dyspnea, pnd, orthopnea, n, v, dizziness, syncope, edema, weight gain, or early satiety. All other systems reviewed and are otherwise negative except as noted above.   Physical Exam    VS:  BP 136/74   Pulse (!) 56   Ht 5\' 10"  (1.778 m)   Wt 185 lb 6.4 oz (84.1 kg)   SpO2 95%   BMI 26.60 kg/m   GEN: Well nourished, well developed, in no acute distress. HEENT: normal. Neck: Supple, no JVD, carotid bruits, or masses. Cardiac: RRR, no murmurs, rubs, or gallops. No clubbing, cyanosis, edema.  Radials/DP/PT 2+ and equal bilaterally.  Respiratory:  Respirations  regular and unlabored, clear to auscultation bilaterally. GI: Soft, nontender, nondistended, BS + x 4. MS: no deformity or atrophy. Skin: warm and dry, no rash. Neuro:  Strength and sensation are intact. Psych: Normal affect.  Accessory Clinical Findings    ECG personally reviewed by me today - EKG Interpretation Date/Time:  Thursday April 07 2023 08:14:11 EST Ventricular Rate:  56 PR Interval:  140 QRS Duration:  102 QT Interval:  450 QTC Calculation: 434 R Axis:   83  Text Interpretation: Sinus bradycardia When compared with ECG of 03-Apr-2015 06:47, Right bundle branch block is no longer Present Criteria for Inferior infarct are no longer Present Confirmed by Bernadene Person ([id number]) on 04/07/2023 8:21:46 AM  - no acute changes.   Lab Results  Component Value Date   WBC 6.7 03/17/2021   HGB 15.0 03/17/2021   HCT 44.7 03/17/2021   MCV 91 03/17/2021   PLT 223 03/17/2021   Lab Results  Component Value Date   CREATININE 0.98 04/06/2022   BUN 16 04/06/2022   NA 141 04/06/2022   K 4.8 04/06/2022   CL 105 04/06/2022   CO2 25 04/06/2022   Lab Results  Component Value Date   ALT 18 04/06/2022   AST 17 04/06/2022   ALKPHOS 59 04/06/2022   BILITOT 0.3 04/06/2022   Lab Results  Component Value Date   CHOL 120 04/06/2022   HDL 56 04/06/2022   LDLCALC 52 04/06/2022   TRIG 52 04/06/2022   CHOLHDL 2.1 04/06/2022    Lab  Results  Component Value Date   HGBA1C 6.0 (H) 04/06/2022    Assessment & Plan    1. CAD: S/p STEMI, DES-LCx-OM2 and DES-RCA in 2016. Stable with no anginal symptoms. No indication for ischemic evaluation.  Continue aspirin, carvedilol, Crestor.  2. Hypertension: BP well controlled. Continue current antihypertensive regimen.   3. Hyperlipidemia: LDL was 52 in 03/2023. Will repeat fasting lipids, CMET today.   4. Sinus bradycardia: EKG today shows sinus bradycardia, he is asymptomatic.  Stable on carvedilol.  5. Disposition: Follow-up in 1 year.        Joylene Grapes, NP 04/07/2023, 8:35 AM

## 2023-04-08 LAB — LIPID PANEL
Chol/HDL Ratio: 2 {ratio} (ref 0.0–5.0)
Cholesterol, Total: 110 mg/dL (ref 100–199)
HDL: 54 mg/dL (ref >39)
LDL Chol Calc (NIH): 45 mg/dL (ref 0–99)
Triglycerides: 44 mg/dL (ref 0–149)
VLDL Cholesterol Cal: 11 mg/dL (ref 5–40)

## 2023-04-08 LAB — COMPREHENSIVE METABOLIC PANEL
ALT: 16 [IU]/L (ref 0–44)
AST: 15 [IU]/L (ref 0–40)
Albumin: 4.3 g/dL (ref 3.9–4.9)
Alkaline Phosphatase: 60 [IU]/L (ref 44–121)
BUN/Creatinine Ratio: 14 (ref 10–24)
BUN: 12 mg/dL (ref 8–27)
Bilirubin Total: 0.3 mg/dL (ref 0.0–1.2)
CO2: 22 mmol/L (ref 20–29)
Calcium: 9 mg/dL (ref 8.6–10.2)
Chloride: 106 mmol/L (ref 96–106)
Creatinine, Ser: 0.85 mg/dL (ref 0.76–1.27)
Globulin, Total: 2.1 g/dL (ref 1.5–4.5)
Glucose: 84 mg/dL (ref 70–99)
Potassium: 4.8 mmol/L (ref 3.5–5.2)
Sodium: 140 mmol/L (ref 134–144)
Total Protein: 6.4 g/dL (ref 6.0–8.5)
eGFR: 99 mL/min/{1.73_m2} (ref >59)

## 2023-04-14 ENCOUNTER — Telehealth: Payer: Self-pay

## 2023-04-14 NOTE — Telephone Encounter (Addendum)
Called patient regarding results. Spoke with patients wife regarding results. Patients wife had understanding of results.----- Message from Joylene Grapes sent at 04/11/2023  7:54 AM EST ----- Recent labs show cholesterol is at goal, kidney function, electrolytes, and liver enzymes are stable.  This is good news.  Continue current medications and follow-up as planned.  Thank you-EM

## 2023-05-11 ENCOUNTER — Ambulatory Visit (HOSPITAL_COMMUNITY): Payer: BC Managed Care – PPO

## 2023-05-30 ENCOUNTER — Encounter: Payer: Self-pay | Admitting: Orthopaedic Surgery

## 2023-05-30 ENCOUNTER — Ambulatory Visit: Payer: BC Managed Care – PPO | Admitting: Orthopaedic Surgery

## 2023-05-30 ENCOUNTER — Other Ambulatory Visit (INDEPENDENT_AMBULATORY_CARE_PROVIDER_SITE_OTHER): Payer: Self-pay

## 2023-05-30 DIAGNOSIS — M25561 Pain in right knee: Secondary | ICD-10-CM

## 2023-05-30 DIAGNOSIS — G8929 Other chronic pain: Secondary | ICD-10-CM

## 2023-05-30 NOTE — Progress Notes (Signed)
The patient is a 63 year old gentleman who I have taken care of his wife.  He comes in today with chronic right knee pain and he really points to the tibial tubercle as the main source of his pain.  He has prominence in this area.  He says it swells on and off.  He says a few weeks ago was very painful but now its eased off and he is having no pain today.  He says he injured this knee years ago playing sports I believe.  It does not bother him on a daily basis.  I was able to review all of his medications and past medical history within epic.  He currently denies any fever, chills, nausea, vomiting.  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  There is a little bit inflammation of the patella tendon.  There is no knee joint effusion today.  His knee has mild varus malalignment that is correctable.  He has good range of motion of his knee and there was significant patellofemoral crepitation.  X-rays of his right knee today show moderate arthritis.  There is significant medial joint space narrowing and osteophytes in all 3 compartments.  There is prominent tibial tubercle and evidence of Osgood Slaughter.  I showed him a knee model and explained what he is experiencing in terms of the tendinitis and inflammation at his prominent tibial tubercle.  I have recommended Voltaren gel when he does have flareups of pain.  Right now he is pain-free.  I explained the disease process as relates to this chronic condition.  He does not hurt directly in the knee itself right now so I would not recommend any other treatments for arthritis until his knee worsens in any way.  All question concerns were answered addressed.  He agrees with this treatment plan.

## 2023-06-06 ENCOUNTER — Telehealth: Payer: Self-pay | Admitting: *Deleted

## 2023-06-06 NOTE — Telephone Encounter (Signed)
 LVM to call office to inform them that we need to reschedule the appointment on 07/07/2023 due to the provider being out of the office. Please assist in getting this rescheduled when they call back.

## 2023-07-07 ENCOUNTER — Ambulatory Visit: Payer: BC Managed Care – PPO | Admitting: Family Medicine

## 2023-07-21 ENCOUNTER — Ambulatory Visit: Payer: BC Managed Care – PPO | Admitting: Family Medicine

## 2023-07-21 ENCOUNTER — Encounter: Payer: Self-pay | Admitting: Family Medicine

## 2023-07-21 VITALS — BP 136/84 | HR 61 | Ht 70.0 in | Wt 190.8 lb

## 2023-07-21 DIAGNOSIS — J42 Unspecified chronic bronchitis: Secondary | ICD-10-CM | POA: Diagnosis not present

## 2023-07-21 DIAGNOSIS — Z7689 Persons encountering health services in other specified circumstances: Secondary | ICD-10-CM

## 2023-07-21 DIAGNOSIS — I10 Essential (primary) hypertension: Secondary | ICD-10-CM

## 2023-07-21 DIAGNOSIS — E785 Hyperlipidemia, unspecified: Secondary | ICD-10-CM

## 2023-07-21 DIAGNOSIS — I2510 Atherosclerotic heart disease of native coronary artery without angina pectoris: Secondary | ICD-10-CM | POA: Diagnosis not present

## 2023-07-21 DIAGNOSIS — Z114 Encounter for screening for human immunodeficiency virus [HIV]: Secondary | ICD-10-CM

## 2023-07-21 DIAGNOSIS — Z1212 Encounter for screening for malignant neoplasm of rectum: Secondary | ICD-10-CM

## 2023-07-21 DIAGNOSIS — Z1159 Encounter for screening for other viral diseases: Secondary | ICD-10-CM

## 2023-07-21 DIAGNOSIS — Z1211 Encounter for screening for malignant neoplasm of colon: Secondary | ICD-10-CM

## 2023-07-21 DIAGNOSIS — Z122 Encounter for screening for malignant neoplasm of respiratory organs: Secondary | ICD-10-CM

## 2023-07-21 DIAGNOSIS — Z9861 Coronary angioplasty status: Secondary | ICD-10-CM

## 2023-07-21 DIAGNOSIS — Z125 Encounter for screening for malignant neoplasm of prostate: Secondary | ICD-10-CM

## 2023-07-21 NOTE — Assessment & Plan Note (Signed)
 Follows up with cardiology regularly.  Continue aspirin, carvedilol, rosuvastatin.  Nitroglycerin as needed but has not used it.

## 2023-07-21 NOTE — Progress Notes (Signed)
 New Patient Office Visit  Subjective   Patient ID: Gordon Hayes, male    DOB: 1960/05/03  Age: 63 y.o. MRN: [id number]  CC:  Chief Complaint  Patient presents with   New Patient (Initial Visit)    HPI Gordon Hayes presents to establish care Patient was previously a patient here in 2017.  Has not had a primary care provider since then.  Does see cardiology regularly due to coronary artery disease.  PMH: CAD, stent, COPD, osteoarthritis, HLD  Meds: ASA, carvedilol, Crestor,   PSH: spinal fusion and hernia repair.    FH: father - bladder cancer; pgf - prostate, mgm - bladder.  Father - MI, grandfather - MI deceased, brother - MI - deceased.    Tobacco use: former - quit 8 years ago.  Age 14 to 80.  Ppd.   Alcohol use: occasional beer.   Marital status: married.  3 stepdaughters,  2 grandchildren.   Employment: Insurance underwriter at [employer].     Screenings:  Colon Cancer: Due.   Lung Cancer: never.      Outpatient Encounter Medications as of 07/21/2023  Medication Sig   aspirin EC 81 MG tablet Take 1 tablet (81 mg total) by mouth daily.   carvedilol (COREG) 6.25 MG tablet TAKE 1 TABLET BY MOUTH TWICE A DAY   nitroGLYCERIN (NITROSTAT) 0.4 MG SL tablet Place 1 tablet (0.4 mg total) under the tongue every 5 (five) minutes as needed for chest pain.   rosuvastatin (CRESTOR) 10 MG tablet Take 1 tablet (10 mg total) by mouth daily.   No facility-administered encounter medications on file as of 07/21/2023.    Past Medical History:  Diagnosis Date   Back pain, lumbosacral    CAD S/P percutaneous coronary angioplasty 03/2015   OM2 99>0% w/ 2.75 mm x 16 mm (3.2 mm) Synergy DES; inferior OM2 branch 80%, med rx, pRCA 70%, mRCA 90% (s/p staged PCI w/ 2. 75 X 33 mm Xience Alpine DES);    Carpal tunnel syndrome, bilateral    COPD (chronic obstructive pulmonary disease) (HCC) 2013   DJD (degenerative joint disease) of knee    STEMI (ST elevation myocardial  infarction) (HCC)  03/31/2015   Subtotal OM2 - PCI with DES. EF 45-50%; Staged PCI of RCA    Past Surgical History:  Procedure Laterality Date   CARDIAC CATHETERIZATION N/A 03/31/2015   Procedure: Left Heart Cath and Coronary Angiography;  Surgeon: Marykay Lex, MD;  Location: [facility] INVASIVE CV LAB;  Service: Cardiovascular;  Subtotoal OM2, 85% RCA with diffuse upstream Dz. EF 45-50%   CARDIAC CATHETERIZATION N/A 03/31/2015   Procedure: Coronary Stent Intervention;  Surgeon: Marykay Lex, MD;  Location: [facility] INVASIVE CV LAB;: OM2 99% - Synergy DES 2.75 mm x 16 mm (3.2 mm).    CARDIAC CATHETERIZATION N/A 04/02/2015   Procedure: Coronary Stent Intervention;  Surgeon: Iran Ouch, MD;  Location: [facility] INVASIVE CV LAB: STAGED PCI  of RCA  90% - Xience DES 2.75 mm x 33 mm   HERNIA REPAIR     right inguinal   POSTERIOR LAMINECTOMY / DECOMPRESSION LUMBAR SPINE  2007   TONSILLECTOMY     TRANSTHORACIC ECHOCARDIOGRAM  06/2015   EF 55-60%, mild LVH. Normal wall motion. Gr 1 DD.     Family History  Problem Relation Age of Onset   Hearing loss Mother    Hypertension Mother    Arthritis Mother    Cancer Father 50  prostate, bladder   Heart disease Father    Stroke Father    Heart attack Father        2   Cancer Maternal Grandfather    Cancer Paternal Grandmother    Heart disease Paternal Grandfather    Colon cancer Neg Hx    Esophageal cancer Neg Hx    Rectal cancer Neg Hx    Stomach cancer Neg Hx     Social History   Socioeconomic History   Marital status: Married    Spouse name: Asher Muir   Number of children: 3   Years of education: 12   Highest education level: Not on file  Occupational History   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  I don't have my mind right to do it yet."  Substance and Sexual Activity   Alcohol use: Yes    Alcohol/week: 0.0 - 4.0 standard drinks of alcohol   Drug use: No   Sexual activity: Yes    Partners: Female  Other Topics Concern   Not on file  Social History Narrative   Denied application for additional life insurance policy due to recent diagnosis of COPD.   Social Drivers of Corporate investment banker Strain: Not on file  Food Insecurity: Not on file  Transportation Needs: Not on file  Physical Activity: Not on file  Stress: Not on file  Social Connections: Not on file  Intimate Partner Violence: Not on file    ROS     Objective   BP 136/84   Pulse 61   Ht 5\' 10"  (1.778 m)   Wt 190 lb 12.8 oz (86.5 kg)   SpO2 97%   BMI 27.38 kg/m   Physical Exam General: Alert, oriented HEENT: PERRLA, EOMI, moist mucosa.  False upper teeth CV: Regular rate and rhythm no murmurs Pulmonary: Lungs clear bilaterally no wheeze or crackles GI: Soft, nontender.  Normal bowel sounds MSK: Strength equal bilaterally, normal gait Skin: Warm and dry Psych: Pleasant affect     Assessment & Plan:   Encounter to establish care  Chronic bronchitis, unspecified chronic bronchitis type (HCC) Assessment & Plan: Patient no longer smoking.  No complaints of shortness of breath.  Continue to monitor.  Not on any inhalers currently.   Hyperlipidemia with target LDL less than 70 -     CBC with Differential/Platelet; Future -     Hemoglobin A1c; Future  Essential hypertension Assessment & Plan: Initially elevated but repeat was better.  Continue to monitor.  On carvedilol.  Orders: -     CBC with Differential/Platelet; Future  CAD S/P percutaneous coronary angioplasty Assessment & Plan: Follows up with cardiology regularly.  Continue aspirin, carvedilol, rosuvastatin.   Nitroglycerin as needed but has not used it.   Encounter for colorectal cancer screening -     Ambulatory referral to Gastroenterology  Screening for HIV (human immunodeficiency virus) -     HIV Antibody (routine testing w rflx); Future  Encounter for hepatitis C screening test for low risk patient -     Hepatitis C antibody; Future  Encounter for screening for lung cancer -     CT CHEST LUNG CANCER SCREENING LOW DOSE WO CONTRAST; Future  Prostate cancer screening -     PSA; Future    Return in about 3 months (around 10/21/2023) for hld, HTN.   Sandre Kitty, MD

## 2023-07-21 NOTE — Patient Instructions (Addendum)
 It was nice to see you today,  We addressed the following topics today: -You will need to schedule a lab visit next week - I am ordering a CT scan test of your lungs to screen for lung cancer.  I am also ordering a colonoscopy.  Someone will call you to schedule both of these individually.   Have a great day,  Frederic Jericho, MD

## 2023-07-21 NOTE — Assessment & Plan Note (Signed)
 Initially elevated but repeat was better.  Continue to monitor.  On carvedilol.

## 2023-07-21 NOTE — Assessment & Plan Note (Signed)
 Patient no longer smoking.  No complaints of shortness of breath.  Continue to monitor.  Not on any inhalers currently.

## 2023-07-25 ENCOUNTER — Other Ambulatory Visit

## 2023-07-25 DIAGNOSIS — Z125 Encounter for screening for malignant neoplasm of prostate: Secondary | ICD-10-CM | POA: Diagnosis not present

## 2023-07-25 DIAGNOSIS — Z114 Encounter for screening for human immunodeficiency virus [HIV]: Secondary | ICD-10-CM | POA: Diagnosis not present

## 2023-07-25 DIAGNOSIS — I10 Essential (primary) hypertension: Secondary | ICD-10-CM

## 2023-07-25 DIAGNOSIS — E785 Hyperlipidemia, unspecified: Secondary | ICD-10-CM

## 2023-07-25 DIAGNOSIS — Z1159 Encounter for screening for other viral diseases: Secondary | ICD-10-CM | POA: Diagnosis not present

## 2023-07-26 ENCOUNTER — Encounter: Payer: Self-pay | Admitting: Family Medicine

## 2023-07-26 LAB — CBC WITH DIFFERENTIAL/PLATELET
Basophils Absolute: 0 10*3/uL (ref 0.0–0.2)
Basos: 1 %
EOS (ABSOLUTE): 0.1 10*3/uL (ref 0.0–0.4)
Eos: 2 %
Hematocrit: 44.9 % (ref 37.5–51.0)
Hemoglobin: 14.7 g/dL (ref 13.0–17.7)
Immature Grans (Abs): 0 10*3/uL (ref 0.0–0.1)
Immature Granulocytes: 0 %
Lymphocytes Absolute: 1.9 10*3/uL (ref 0.7–3.1)
Lymphs: 30 %
MCH: 30.6 pg (ref 26.6–33.0)
MCHC: 32.7 g/dL (ref 31.5–35.7)
MCV: 94 fL (ref 79–97)
Monocytes Absolute: 0.6 10*3/uL (ref 0.1–0.9)
Monocytes: 10 %
Neutrophils Absolute: 3.7 10*3/uL (ref 1.4–7.0)
Neutrophils: 57 %
Platelets: 206 10*3/uL (ref 150–450)
RBC: 4.8 x10E6/uL (ref 4.14–5.80)
RDW: 13 % (ref 11.6–15.4)
WBC: 6.4 10*3/uL (ref 3.4–10.8)

## 2023-07-26 LAB — HEMOGLOBIN A1C
Est. average glucose Bld gHb Est-mCnc: 123 mg/dL
Hgb A1c MFr Bld: 5.9 % — ABNORMAL HIGH (ref 4.8–5.6)

## 2023-07-26 LAB — HEPATITIS C ANTIBODY: Hep C Virus Ab: NONREACTIVE

## 2023-07-26 LAB — PSA: Prostate Specific Ag, Serum: 1.8 ng/mL (ref 0.0–4.0)

## 2023-07-26 LAB — HIV ANTIBODY (ROUTINE TESTING W REFLEX): HIV Screen 4th Generation wRfx: NONREACTIVE

## 2023-10-21 ENCOUNTER — Ambulatory Visit: Admitting: Family Medicine

## 2023-12-29 ENCOUNTER — Encounter: Payer: Self-pay | Admitting: *Deleted

## 2024-02-27 ENCOUNTER — Encounter: Payer: Self-pay | Admitting: Radiology

## 2024-02-27 ENCOUNTER — Telehealth: Payer: Self-pay | Admitting: Cardiology

## 2024-02-27 NOTE — Telephone Encounter (Signed)
 Left message for jamie to call

## 2024-02-27 NOTE — Telephone Encounter (Signed)
 Gordon Hayes is requesting a callback regarding her wanting to know about lab orders being placed for pt. Please advise

## 2024-03-07 ENCOUNTER — Observation Stay (HOSPITAL_COMMUNITY)

## 2024-03-07 ENCOUNTER — Emergency Department (HOSPITAL_COMMUNITY)

## 2024-03-07 ENCOUNTER — Encounter (HOSPITAL_COMMUNITY): Payer: Self-pay | Admitting: Internal Medicine

## 2024-03-07 ENCOUNTER — Other Ambulatory Visit: Payer: Self-pay

## 2024-03-07 ENCOUNTER — Observation Stay (HOSPITAL_COMMUNITY)
Admission: EM | Admit: 2024-03-07 | Discharge: 2024-03-09 | Disposition: A | Discharge status: Home / Self Care | Attending: Internal Medicine | Admitting: Internal Medicine

## 2024-03-07 DIAGNOSIS — R4781 Slurred speech: Secondary | ICD-10-CM | POA: Diagnosis not present

## 2024-03-07 DIAGNOSIS — G459 Transient cerebral ischemic attack, unspecified: Secondary | ICD-10-CM | POA: Diagnosis not present

## 2024-03-07 DIAGNOSIS — I6521 Occlusion and stenosis of right carotid artery: Secondary | ICD-10-CM | POA: Insufficient documentation

## 2024-03-07 DIAGNOSIS — Z87891 Personal history of nicotine dependence: Secondary | ICD-10-CM

## 2024-03-07 DIAGNOSIS — J449 Chronic obstructive pulmonary disease, unspecified: Secondary | ICD-10-CM | POA: Insufficient documentation

## 2024-03-07 DIAGNOSIS — Z79899 Other long term (current) drug therapy: Secondary | ICD-10-CM | POA: Insufficient documentation

## 2024-03-07 DIAGNOSIS — Z7982 Long term (current) use of aspirin: Secondary | ICD-10-CM | POA: Insufficient documentation

## 2024-03-07 DIAGNOSIS — J438 Other emphysema: Principal | ICD-10-CM

## 2024-03-07 DIAGNOSIS — I6501 Occlusion and stenosis of right vertebral artery: Secondary | ICD-10-CM | POA: Diagnosis not present

## 2024-03-07 DIAGNOSIS — Z812 Family history of tobacco abuse and dependence: Secondary | ICD-10-CM

## 2024-03-07 DIAGNOSIS — R4701 Aphasia: Secondary | ICD-10-CM | POA: Diagnosis not present

## 2024-03-07 DIAGNOSIS — I5032 Chronic diastolic (congestive) heart failure: Secondary | ICD-10-CM | POA: Diagnosis not present

## 2024-03-07 DIAGNOSIS — I4891 Unspecified atrial fibrillation: Principal | ICD-10-CM

## 2024-03-07 DIAGNOSIS — F10920 Alcohol use, unspecified with intoxication, uncomplicated: Secondary | ICD-10-CM | POA: Insufficient documentation

## 2024-03-07 DIAGNOSIS — I6782 Cerebral ischemia: Secondary | ICD-10-CM | POA: Diagnosis not present

## 2024-03-07 DIAGNOSIS — Z8709 Personal history of other diseases of the respiratory system: Secondary | ICD-10-CM | POA: Diagnosis not present

## 2024-03-07 DIAGNOSIS — R531 Weakness: Secondary | ICD-10-CM | POA: Diagnosis not present

## 2024-03-07 DIAGNOSIS — I110 Hypertensive heart disease with heart failure: Secondary | ICD-10-CM | POA: Diagnosis not present

## 2024-03-07 DIAGNOSIS — R2990 Unspecified symptoms and signs involving the nervous system: Secondary | ICD-10-CM | POA: Diagnosis not present

## 2024-03-07 DIAGNOSIS — Z8679 Personal history of other diseases of the circulatory system: Secondary | ICD-10-CM | POA: Insufficient documentation

## 2024-03-07 DIAGNOSIS — I639 Cerebral infarction, unspecified: Secondary | ICD-10-CM

## 2024-03-07 DIAGNOSIS — Z8673 Personal history of transient ischemic attack (TIA), and cerebral infarction without residual deficits: Secondary | ICD-10-CM

## 2024-03-07 DIAGNOSIS — E785 Hyperlipidemia, unspecified: Secondary | ICD-10-CM | POA: Insufficient documentation

## 2024-03-07 DIAGNOSIS — R519 Headache, unspecified: Secondary | ICD-10-CM | POA: Diagnosis not present

## 2024-03-07 DIAGNOSIS — J432 Centrilobular emphysema: Secondary | ICD-10-CM | POA: Diagnosis not present

## 2024-03-07 DIAGNOSIS — R079 Chest pain, unspecified: Secondary | ICD-10-CM | POA: Diagnosis not present

## 2024-03-07 LAB — CBC
HCT: 46.7 % (ref 39.0–52.0)
Hemoglobin: 15.2 g/dL (ref 13.0–17.0)
MCH: 30.7 pg (ref 26.0–34.0)
MCHC: 32.5 g/dL (ref 30.0–36.0)
MCV: 94.3 fL (ref 80.0–100.0)
Platelets: 201 K/uL (ref 150–400)
RBC: 4.95 MIL/uL (ref 4.22–5.81)
RDW: 13.6 % (ref 11.5–15.5)
WBC: 7.1 K/uL (ref 4.0–10.5)
nRBC: 0 % (ref 0.0–0.2)

## 2024-03-07 LAB — COMPREHENSIVE METABOLIC PANEL WITH GFR
ALT: 32 U/L (ref 0–44)
AST: 24 U/L (ref 15–41)
Albumin: 3.6 g/dL (ref 3.5–5.0)
Alkaline Phosphatase: 56 U/L (ref 38–126)
Anion gap: 12 (ref 5–15)
BUN: 11 mg/dL (ref 8–23)
CO2: 20 mmol/L — ABNORMAL LOW (ref 22–32)
Calcium: 8.6 mg/dL — ABNORMAL LOW (ref 8.9–10.3)
Chloride: 104 mmol/L (ref 98–111)
Creatinine, Ser: 0.96 mg/dL (ref 0.61–1.24)
GFR, Estimated: >60 mL/min (ref >60)
Glucose, Bld: 80 mg/dL (ref 70–99)
Potassium: 4 mmol/L (ref 3.5–5.1)
Sodium: 136 mmol/L (ref 135–145)
Total Bilirubin: 0.7 mg/dL (ref 0.0–1.2)
Total Protein: 6.3 g/dL — ABNORMAL LOW (ref 6.5–8.1)

## 2024-03-07 LAB — I-STAT CHEM 8, ED
BUN: 11 mg/dL (ref 8–23)
Calcium, Ion: 1.04 mmol/L — ABNORMAL LOW (ref 1.15–1.40)
Chloride: 103 mmol/L (ref 98–111)
Creatinine, Ser: 1 mg/dL (ref 0.61–1.24)
Glucose, Bld: 76 mg/dL (ref 70–99)
HCT: 47 % (ref 39.0–52.0)
Hemoglobin: 16 g/dL (ref 13.0–17.0)
Potassium: 4 mmol/L (ref 3.5–5.1)
Sodium: 136 mmol/L (ref 135–145)
TCO2: 21 mmol/L — ABNORMAL LOW (ref 22–32)

## 2024-03-07 LAB — DIFFERENTIAL
Abs Immature Granulocytes: 0.02 K/uL (ref 0.00–0.07)
Basophils Absolute: 0 K/uL (ref 0.0–0.1)
Basophils Relative: 1 %
Eosinophils Absolute: 0.1 K/uL (ref 0.0–0.5)
Eosinophils Relative: 1 %
Immature Granulocytes: 0 %
Lymphocytes Relative: 30 %
Lymphs Abs: 2.1 K/uL (ref 0.7–4.0)
Monocytes Absolute: 0.7 K/uL (ref 0.1–1.0)
Monocytes Relative: 10 %
Neutro Abs: 4.1 K/uL (ref 1.7–7.7)
Neutrophils Relative %: 58 %

## 2024-03-07 LAB — BRAIN NATRIURETIC PEPTIDE: B Natriuretic Peptide: 477.9 pg/mL — ABNORMAL HIGH (ref 0.0–100.0)

## 2024-03-07 LAB — URINALYSIS, COMPLETE (UACMP) WITH MICROSCOPIC
Bacteria, UA: NONE SEEN
Bilirubin Urine: NEGATIVE
Glucose, UA: NEGATIVE mg/dL
Ketones, ur: NEGATIVE mg/dL
Leukocytes,Ua: NEGATIVE
Nitrite: NEGATIVE
Protein, ur: NEGATIVE mg/dL
Specific Gravity, Urine: 1.012 (ref 1.005–1.030)
pH: 6 (ref 5.0–8.0)

## 2024-03-07 LAB — APTT: aPTT: 29 s (ref 24–36)

## 2024-03-07 LAB — PROTIME-INR
INR: 1.1 (ref 0.8–1.2)
Prothrombin Time: 14.3 s (ref 11.4–15.2)

## 2024-03-07 LAB — MAGNESIUM: Magnesium: 2 mg/dL (ref 1.7–2.4)

## 2024-03-07 LAB — ETHANOL: Alcohol, Ethyl (B): 32 mg/dL — ABNORMAL HIGH (ref <15)

## 2024-03-07 LAB — HEMOGLOBIN A1C
Hgb A1c MFr Bld: 5.8 % — ABNORMAL HIGH (ref 4.8–5.6)
Mean Plasma Glucose: 119.76 mg/dL

## 2024-03-07 MED ORDER — CLOPIDOGREL BISULFATE 300 MG PO TABS
300.0000 mg | ORAL_TABLET | Freq: Once | ORAL | Status: AC
  Administered 2024-03-07: 300 mg via ORAL
  Filled 2024-03-07: qty 1

## 2024-03-07 MED ORDER — IOHEXOL 350 MG/ML SOLN
60.0000 mL | Freq: Once | INTRAVENOUS | Status: AC | PRN
  Administered 2024-03-07: 60 mL via INTRAVENOUS

## 2024-03-07 MED ORDER — MELATONIN 3 MG PO TABS: 3.0000 mg | ORAL_TABLET | Freq: Every evening | ORAL | Status: DC | PRN

## 2024-03-07 MED ORDER — DILTIAZEM HCL-DEXTROSE 125-5 MG/125ML-% IV SOLN (PREMIX)
5.0000 mg/h | INTRAVENOUS | Status: DC
  Administered 2024-03-07: 5 mg/h via INTRAVENOUS
  Filled 2024-03-07 (×2): qty 125

## 2024-03-07 MED ORDER — ACETAMINOPHEN 650 MG RE SUPP: 650.0000 mg | Freq: Four times a day (QID) | RECTAL | Status: DC | PRN

## 2024-03-07 MED ORDER — ASPIRIN 81 MG PO TBEC: 81.0000 mg | DELAYED_RELEASE_TABLET | Freq: Every day | ORAL | Status: DC

## 2024-03-07 MED ORDER — HEPARIN (PORCINE) 25000 UT/250ML-% IV SOLN
1150.0000 [IU]/h | INTRAVENOUS | Status: DC
  Administered 2024-03-07: 1000 [IU]/h via INTRAVENOUS
  Filled 2024-03-07: qty 250

## 2024-03-07 MED ORDER — ASPIRIN 325 MG PO TABS
325.0000 mg | ORAL_TABLET | Freq: Once | ORAL | Status: AC
  Administered 2024-03-07: 325 mg via ORAL
  Filled 2024-03-07: qty 1

## 2024-03-07 MED ORDER — LABETALOL HCL 5 MG/ML IV SOLN: 10.0000 mg | INTRAVENOUS | Status: DC | PRN

## 2024-03-07 MED ORDER — DILTIAZEM LOAD VIA INFUSION
20.0000 mg | Freq: Once | INTRAVENOUS | Status: AC
  Filled 2024-03-07: qty 20

## 2024-03-07 MED ORDER — ONDANSETRON HCL 4 MG/2ML IJ SOLN: 4.0000 mg | Freq: Four times a day (QID) | INTRAMUSCULAR | Status: DC | PRN

## 2024-03-07 MED ORDER — ACETAMINOPHEN 325 MG PO TABS: 650.0000 mg | ORAL_TABLET | Freq: Four times a day (QID) | ORAL | Status: DC | PRN

## 2024-03-07 MED ORDER — SODIUM CHLORIDE 0.9% FLUSH
3.0000 mL | Freq: Once | INTRAVENOUS | Status: AC
  Administered 2024-03-07: 3 mL via INTRAVENOUS

## 2024-03-07 MED ORDER — LEVALBUTEROL HCL 1.25 MG/0.5ML IN NEBU
1.2500 mg | INHALATION_SOLUTION | RESPIRATORY_TRACT | Status: DC | PRN
  Filled 2024-03-07: qty 0.5

## 2024-03-07 MED ORDER — CLOPIDOGREL BISULFATE 75 MG PO TABS: 75.0000 mg | ORAL_TABLET | Freq: Every day | ORAL | Status: DC

## 2024-03-07 MED ORDER — DILTIAZEM HCL-DEXTROSE 125-5 MG/125ML-% IV SOLN (PREMIX)
INTRAVENOUS | Status: AC
  Administered 2024-03-07: 20 mg via INTRAVENOUS
  Filled 2024-03-07: qty 125

## 2024-03-07 MED ORDER — HEPARIN SODIUM (PORCINE) 5000 UNIT/ML IJ SOLN
4000.0000 [IU] | Freq: Once | INTRAMUSCULAR | Status: DC
  Filled 2024-03-07: qty 1

## 2024-03-07 MED ORDER — STROKE: EARLY STAGES OF RECOVERY BOOK
Freq: Once | Status: AC
  Filled 2024-03-07: qty 1

## 2024-03-07 MED ORDER — ROSUVASTATIN CALCIUM 5 MG PO TABS
10.0000 mg | ORAL_TABLET | Freq: Every evening | ORAL | Status: DC
  Administered 2024-03-07 – 2024-03-08 (×2): 10 mg via ORAL
  Filled 2024-03-07 (×2): qty 2

## 2024-03-07 NOTE — Code Documentation (Signed)
 Responded to Code Stroke called at 1853 for R sided weakness and aphasia, [id number]. Pt arrived at 1921 with resolution of symptoms, NIH-0, CBG-81, CT head negative for acute changes, CTA-no LVO. TNK-not given-resolution of symptoms. Pt placed on TIA alert. Please complete VS/neuro checks(including NIH) q2h x 12h, then q4h.

## 2024-03-07 NOTE — Consult Note (Addendum)
 NEUROLOGY CONSULT NOTE   Date of service: March 07, 2024 Patient Name: Gordon Hayes MRN:  [id number] DOB:  Oct 27, 1960 Chief Complaint: R sided weakness and aphasia Requesting Provider: No att. providers found  History of Present Illness  Gordon Hayes is a 63 y.o. male with hx of CAD. STEMI, COPD, who was with family and had sudden onset R sided weakness and aphasia at 67. This was witnessed by the patient and family.  EMS called and he was brought in as a code stroke. He is improved enroute with full spontaneous resolution of aphasia and improved R sided weakness and now has a NIHSS of 0 with only mild grip strength weakness in his R hand.  He has a mild headache behind his R eye.  Occasionally smokes, no personal or family hx of strokes.  LKW: 1825 Modified rankin score: 0-Completely asymptomatic and back to baseline post- stroke IV Thrombolysis: not offered at this time due to no symptoms. EVT: not offered at this time due to no symptoms.  NIHSS components Score: Comment  1a Level of Conscious 0[]  1[]  2[]  3[]      1b LOC Questions 0[]  1[]  2[]       1c LOC Commands 0[]  1[]  2[]       2 Best Gaze 0[]  1[]  2[]       3 Visual 0[]  1[]  2[]  3[]      4 Facial Palsy 0[]  1[]  2[]  3[]      5a Motor Arm - left 0[]  1[]  2[]  3[]  4[]  UN[]    5b Motor Arm - Right 0[]  1[]  2[]  3[]  4[]  UN[]    6a Motor Leg - Left 0[]  1[]  2[]  3[]  4[]  UN[]    6b Motor Leg - Right 0[]  1[]  2[]  3[]  4[]  UN[]    7 Limb Ataxia 0[]  1[]  2[]  UN[]      8 Sensory 0[]  1[]  2[]  UN[]      9 Best Language 0[]  1[]  2[]  3[]      10 Dysarthria 0[]  1[]  2[]  UN[]      11 Extinct. and Inattention 0[]  1[]  2[]       TOTAL: 0      ROS  Comprehensive ROS performed and pertinent positives documented in HPI   Past History   Past Medical History:  Diagnosis Date   Back pain, lumbosacral    CAD S/P percutaneous coronary angioplasty 03/2015   OM2 99>0% w/ 2.75 mm x 16 mm (3.2 mm) Synergy DES; inferior OM2 branch 80%, med rx, pRCA 70%, mRCA  90% (s/p staged PCI w/ 2. 75 X 33 mm Xience Alpine DES);    Carpal tunnel syndrome, bilateral    COPD (chronic obstructive pulmonary disease) (HCC) 2013   DJD (degenerative joint disease) of knee    STEMI (ST elevation myocardial infarction) (HCC)  03/31/2015   Subtotal OM2 - PCI with DES. EF 45-50%; Staged PCI of RCA    Past Surgical History:  Procedure Laterality Date   CARDIAC CATHETERIZATION N/A 03/31/2015   Procedure: Left Heart Cath and Coronary Angiography;  Surgeon: Alm LELON Clay, MD;  Location: [facility] INVASIVE CV LAB;  Service: Cardiovascular;  Subtotoal OM2, 85% RCA with diffuse upstream Dz. EF 45-50%   CARDIAC CATHETERIZATION N/A 03/31/2015   Procedure: Coronary Stent Intervention;  Surgeon: Alm LELON Clay, MD;  Location: [facility] INVASIVE CV LAB;: OM2 99% - Synergy DES 2.75 mm x 16 mm (3.2 mm).    CARDIAC CATHETERIZATION N/A 04/02/2015   Procedure: Coronary Stent Intervention;  Surgeon: Deatrice DELENA Cage, MD;  Location: [facility] INVASIVE CV LAB: STAGED PCI  of RCA  90% - Xience DES 2.75 mm x 33 mm   HERNIA REPAIR     right inguinal   POSTERIOR LAMINECTOMY / DECOMPRESSION LUMBAR SPINE  2007   TONSILLECTOMY     TRANSTHORACIC ECHOCARDIOGRAM  06/2015   EF 55-60%, mild LVH. Normal wall motion. Gr 1 DD.     Family History: Family History  Problem Relation Age of Onset   Hearing loss Mother    Hypertension Mother    Arthritis Mother    Cancer Father 61       prostate, bladder   Heart disease Father    Stroke Father    Heart attack Father        2   Cancer Maternal Grandfather    Cancer Paternal Grandmother    Heart disease Paternal Grandfather    Colon cancer Neg Hx    Esophageal cancer Neg Hx    Rectal cancer Neg Hx    Stomach cancer Neg Hx     Social History  reports that he quit smoking about 8 years ago. His smoking use included cigarettes. He started smoking about 43 years ago. He has a 105 pack-year smoking history. He has quit using smokeless tobacco. He reports current  alcohol use. He reports that he does not use drugs.  Allergies  Allergen Reactions   Penicillins Anaphylaxis   Atorvastatin  Other (See Comments)    myalgias    Medications   Current Facility-Administered Medications:    sodium chloride  flush (NS) 0.9 % injection 3 mL, 3 mL, Intravenous, Once, Scheving, Elsie CROME, MD  Current Outpatient Medications:    aspirin  EC 81 MG tablet, Take 1 tablet (81 mg total) by mouth daily., Disp: 90 tablet, Rfl: 3   carvedilol  (COREG ) 6.25 MG tablet, TAKE 1 TABLET BY MOUTH TWICE A DAY, Disp: 180 tablet, Rfl: 3   nitroGLYCERIN  (NITROSTAT ) 0.4 MG SL tablet, Place 1 tablet (0.4 mg total) under the tongue every 5 (five) minutes as needed for chest pain., Disp: 25 tablet, Rfl: 6   rosuvastatin  (CRESTOR ) 10 MG tablet, Take 1 tablet (10 mg total) by mouth daily., Disp: 90 tablet, Rfl: 3  Vitals   There were no vitals filed for this visit.  There is no height or weight on file to calculate BMI.   Physical Exam   General: Laying comfortably in bed; in no acute distress.  HENT: Normal oropharynx and mucosa. Normal external appearance of ears and nose.  Neck: Supple, no pain or tenderness  CV: No JVD. No peripheral edema.  Pulmonary: Symmetric Chest rise. Normal respiratory effort.  Abdomen: Soft to touch, non-tender.  Ext: No cyanosis, edema, or deformity  Skin: No rash. Normal palpation of skin.   Musculoskeletal: Normal digits and nails by inspection. No clubbing.   Neurologic Examination  Mental status/Cognition: Alert, oriented to self, place, month and year, good attention.  Speech/language: Fluent, comprehension intact, object naming intact, repetition intact.  Cranial nerves:   CN II Pupils equal and reactive to light, no VF deficits    CN III,IV,VI EOM intact, no gaze preference or deviation, no nystagmus    CN V normal sensation in V1, V2, and V3 segments bilaterally    CN VII no asymmetry, no nasolabial fold flattening    CN VIII normal  hearing to speech    CN IX & X normal palatal elevation, no uvular deviation   CN XI 5/5 head turn and 5/5 shoulder shrug bilaterally    CN XII midline tongue protrusion  Motor:  Muscle bulk: normal, tone normal, pronator drift none tremor none Mvmt Root Nerve  Muscle Right Left Comments  SA C5/6 Ax Deltoid 5 5   EF C5/6 Mc Biceps 4+ 5   EE C6/7/8 Rad Triceps 4+ 5   WF C6/7 Med FCR     WE C7/8 PIN ECU     F Ab C8/T1 U ADM/FDI 4+ 5   HF L1/2/3 Fem Illopsoas 5 5   KE L2/3/4 Fem Quad 5 5   DF L4/5 D Peron Tib Ant 5 5   PF S1/2 Tibial Grc/Sol 5 5    Sensation:  Light touch Intact throughout   Pin prick    Temperature    Vibration   Proprioception    Coordination/Complex Motor:  - Finger to Nose intact BL - Heel to shin intact BL - Rapid alternating movement are slowed on the right. - Gait: deferred  Labs/Imaging/Neurodiagnostic studies   CBC: No results for input(s): WBC, NEUTROABS, HGB, HCT, MCV, PLT in the last 168 hours. Basic Metabolic Panel:  Lab Results  Component Value Date   NA 140 04/07/2023   K 4.8 04/07/2023   CO2 22 04/07/2023   GLUCOSE 84 04/07/2023   BUN 12 04/07/2023   CREATININE 0.85 04/07/2023   CALCIUM  9.0 04/07/2023   GFRNONAA 97 03/07/2020   GFRAA 112 03/07/2020   Lipid Panel:  Lab Results  Component Value Date   LDLCALC 45 04/07/2023   HgbA1c:  Lab Results  Component Value Date   HGBA1C 5.9 (H) 07/25/2023   Urine Drug Screen: No results found for: LABOPIA, COCAINSCRNUR, LABBENZ, AMPHETMU, THCU, LABBARB  Alcohol Level No results found for: [facility] INR  Lab Results  Component Value Date   INR 1.06 04/02/2015   APTT No results found for: APTT AED levels: No results found for: PHENYTOIN, ZONISAMIDE, LAMOTRIGINE, LEVETIRACETA  CT Head without contrast(Personally reviewed): CTH was negative for a large hypodensity concerning for a large territory infarct or hyperdensity concerning for an ICH  CT angio  Head and Neck with contrast(Personally reviewed): No LVO  MRI Brain(Personally reviewed): Pending  ASSESSMENT   Gordon Hayes is a 63 y.o. male with hx of CAD. STEMI, COPD, who was with family and had sudden onset R sided weakness and aphasia at 75. This was witnessed by the patient and family.  Brought in as a code stroke but improved significantly enroute and now has mild R hand grip weakness only which resolved shortly after arrival to the ED. He was therefore not a candidate for tnkase or thrombectomy.  RECOMMENDATIONS  - Frequent Neuro checks per stroke unit protocol - Recommend brain imaging with MRI Brain without contrast - Recommend obtaining TTE  - Recommend obtaining Lipid panel with LDL - Please start statin if LDL > 70 - Recommend HbA1c to evaluate for diabetes and how well it is controlled. - Antithrombotic - Aspirin  325mg  once along with plavix 300mg  once, followed by Aspirin  81mg  daily along with plavix 75mg  daily x 21days, followed by Aspirin  81mg  daily alone. - Recommend DVT ppx - SBP goal - permissive hypertension first 24 h < 220/110. Held home meds.  - Recommend Telemetry monitoring for arrythmia - Recommend bedside swallow screen prior to PO intake. - Stroke education booklet - Recommend PT/OT/SLP consult  ______________________________________________________________________  Update 8:25 PM: Patient in [place] with RVR in the ED. This is new onset. Will use heparin  gtt neuro scale with no bolus dosing.  Plan discussed with patient and with Dr. Francesca  with the ED team.  I personally spent a total of 75 minutes in the care of the patient today including preparing to see the patient, getting/reviewing separately obtained history, performing a medically appropriate exam/evaluation, counseling and educating, placing orders, referring and communicating with other health care professionals, documenting clinical information in the EHR, independently interpreting  results, communicating results, and coordinating care.   Signed, Jamilex Bohnsack, MD [facility]

## 2024-03-07 NOTE — ED Notes (Signed)
CBG 81 upon arrival to ED.

## 2024-03-07 NOTE — H&P (Signed)
 History and Physical      Gordon Hayes FMW:[id number] DOB: 28-Sep-1960 DOA: 03/07/2024; DOS: 03/07/2024  PCP: Chandra Toribio POUR, MD  Patient coming from: home   I have personally briefly reviewed patient's old medical records in [facility]  Chief Complaint: aphasia  HPI: Gordon Hayes is a 63 y.o. male with medical history significant for coronary artery disease status post PCI with drug-eluting stent to RCA in December 2016, COPD, essential pretension, chronic diastolic heart failure, who is admitted to [facility] on 03/07/2024 with strokelike symptoms after presenting from home to [facility] ED complaining of aphasia.   While eating dinner with his wife, the patient reports acute onset of expressive aphasia, right upper extremity weakness, right-sided vision changes, will starting approximately 1900 on 03/07/2024, prompting the patient be brought to [facility] emergency department via EMS as a code stroke.  Denies any associated acute focal numbness/paresthesias, nor any associated vertigo, headache, nausea, vomiting, facial droop, dysphagia.  Within approximately 30 minutes of onset, patient notes resolution of the above symptoms, without ensuing recurrence.  No known history of prior TIA or stroke.  He has a history of essential hypertension, for which he is on Coreg  as an outpatient.  In the setting of a history of coronary artery disease status post PCI with drug-eluting stent placement to RCA in December 2016, he is on rosuvastatin  10 mg p.o. daily.  Additionally, he is on daily baby aspirin  at home, but otherwise on no additional antiplatelet medications nor any anticoagulants.  Denies any known history of atrial fibrillation.  Most recent echocardiogram was performed in March 2017 and was notable for LVEF 55 to 60%, no focal will but showed 90s, grade 1 diastolic dysfunction, trace mitral regurgitation as well as trace tricuspid regurgitation.  Not on any diuretic medications  at home.  Denies any known history of diabetes.  Admits that he is a former smoker, having quit smoking at the time of his MI in December 2016.  Denies any recent chest pain, shortness of breath, palpitations, diaphoresis, dizziness.  No recent subjective fever, chills, rigors, or generalized myalgias.  No recent dysuria or gross hematuria.     ED Course:  Vital signs in the ED were notable for the following: Afebrile; patient initially found to be in atrial fibrillation with RVR, with presenting heart rates in the 130s, subsequently decreasing into the low 100s following initiation of diltiazem drip; systolic blood pressures in the 120s to 150s; respiratory rate 19-22, oxygen saturation 95 to 97% on room air.  Labs were notable for the following: CMP is notable for the following: Sodium 136, potassium 4.0, creatinine 0.96, glucose 80, calcium  adjusted for mild hypoalbuminemia noted to be 8.9, avidin 3.6.  Otherwise, liver enzymes are within normal limits.  CBC notable for white cell count 7100, hemoglobin 15.2, platelet count 201.  INR 1.1, PTT 29.  Per my interpretation, EKG in ED demonstrated the following: EKG shows atrial fibrillation with RVR, with heart rate 130, nonspecific T wave inversion in V6, and no evidence of ST changes, including no evidence of ST elevation.  Imaging in the ED, per corresponding formal radiology read, was notable for the following: Noncontrast CT head showed no evidence of acute intracranial process, including no evidence of intracranial injury evidence of acute infarct.  CTA head and neck showed no evidence of large vessel occlusion or any evidence of aneurysm.  EDP discussed with on-call neurology, Dr. Vanessa, who recommended hospitalist admission for further TIA/stroke work-up,  including MRI brain, and conveyed that neurology will formally consult.   MRI brain without contrast has been ordered, with result currently pending.  While in the ED, the  following were administered: Aspirin  325 mg p.o. x 1 dose, Plavix 300 mg p.o. x 1 dose, diltiazem 20 mg IV x 1 dose followed by initiation of diltiazem drip.  Initiation of heparin  drip.  Subsequently, the patient was admitted for further evaluation management of presenting strokelike symptoms, including further TIA/stroke workup, as well as further evaluation management of new diagnosis of atrial fibrillation with RVR.    Review of Systems: As per HPI otherwise 10 point review of systems negative.   Past Medical History:  Diagnosis Date   Back pain, lumbosacral    CAD S/P percutaneous coronary angioplasty 03/2015   OM2 99>0% w/ 2.75 mm x 16 mm (3.2 mm) Synergy DES; inferior OM2 branch 80%, med rx, pRCA 70%, mRCA 90% (s/p staged PCI w/ 2. 75 X 33 mm Xience Alpine DES);    Carpal tunnel syndrome, bilateral    COPD (chronic obstructive pulmonary disease) (HCC) 2013   DJD (degenerative joint disease) of knee    STEMI (ST elevation myocardial infarction) (HCC)  03/31/2015   Subtotal OM2 - PCI with DES. EF 45-50%; Staged PCI of RCA    Past Surgical History:  Procedure Laterality Date   CARDIAC CATHETERIZATION N/A 03/31/2015   Procedure: Left Heart Cath and Coronary Angiography;  Surgeon: Alm LELON Clay, MD;  Location: [facility] INVASIVE CV LAB;  Service: Cardiovascular;  Subtotoal OM2, 85% RCA with diffuse upstream Dz. EF 45-50%   CARDIAC CATHETERIZATION N/A 03/31/2015   Procedure: Coronary Stent Intervention;  Surgeon: Alm LELON Clay, MD;  Location: [facility] INVASIVE CV LAB;: OM2 99% - Synergy DES 2.75 mm x 16 mm (3.2 mm).    CARDIAC CATHETERIZATION N/A 04/02/2015   Procedure: Coronary Stent Intervention;  Surgeon: Deatrice DELENA Cage, MD;  Location: [facility] INVASIVE CV LAB: STAGED PCI  of RCA  90% - Xience DES 2.75 mm x 33 mm   HERNIA REPAIR     right inguinal   POSTERIOR LAMINECTOMY / DECOMPRESSION LUMBAR SPINE  2007   TONSILLECTOMY     TRANSTHORACIC ECHOCARDIOGRAM  06/2015   EF 55-60%, mild LVH. Normal wall  motion. Gr 1 DD.     Social History:  reports that he quit smoking about 8 years ago. His smoking use included cigarettes. He started smoking about 43 years ago. He has a 105 pack-year smoking history. He has quit using smokeless tobacco. He reports current alcohol use. He reports that he does not use drugs.   Allergies  Allergen Reactions   Penicillins Anaphylaxis   Atorvastatin  Other (See Comments)    myalgias    Family History  Problem Relation Age of Onset   Hearing loss Mother    Hypertension Mother    Arthritis Mother    Cancer Father 59       prostate, bladder   Heart disease Father    Stroke Father    Heart attack Father        2   Cancer Maternal Grandfather    Cancer Paternal Grandmother    Heart disease Paternal Grandfather    Colon cancer Neg Hx    Esophageal cancer Neg Hx    Rectal cancer Neg Hx    Stomach cancer Neg Hx     Family history reviewed and not pertinent    Prior to Admission medications   Medication Sig Start Date End  Date Taking? Authorizing Provider  aspirin  EC 81 MG tablet Take 1 tablet (81 mg total) by mouth daily. 03/05/19   Anner Alm ORN, MD  carvedilol  (COREG ) 6.25 MG tablet TAKE 1 TABLET BY MOUTH TWICE A DAY 04/07/23   Monge, Damien BROCKS, NP  nitroGLYCERIN  (NITROSTAT ) 0.4 MG SL tablet Place 1 tablet (0.4 mg total) under the tongue every 5 (five) minutes as needed for chest pain. 04/06/22   Anner Alm ORN, MD  rosuvastatin  (CRESTOR ) 10 MG tablet Take 1 tablet (10 mg total) by mouth daily. 04/07/23   Daneen Damien BROCKS, NP     Objective    Physical Exam: Vitals:   03/07/24 1928 03/07/24 1945 03/07/24 2000 03/07/24 2030  BP:  (!) 158/98 (!) 125/115 (!) 142/125  Pulse:  (!) 134 (!) 59 (!) 120  Resp:  19 (!) 22 (!) 21  Temp:  97.7 F (36.5 C)    TempSrc:  Oral    SpO2:  95% 96% 97%  Weight: 85.2 kg     Height: 5' 10 (1.778 m)       General: appears to be stated age; alert, oriented Skin: warm, dry, no rash Head:  AT/[place] Mouth:   Oral mucosa membranes appear moist, normal dentition Neck: supple; trachea midline Heart: Tachycardic, irregular; did not appreciate any M/R/G Lungs: CTAB, did not appreciate any wheezes, rales, or rhonchi Abdomen: + BS; soft, ND, NT Vascular: 2+ pedal pulses b/l; 2+ radial pulses b/l Extremities: no peripheral edema, no muscle wasting    Neuro: strength and sensation intact in upper and lower extremities b/l; no facial droop; normal muscle tone.      Labs on Admission: I have personally reviewed following labs and imaging studies  CBC: Recent Labs  Lab 03/07/24 1927 03/07/24 2006  WBC  --  7.1  NEUTROABS  --  4.1  HGB 16.0 15.2  HCT 47.0 46.7  MCV  --  94.3  PLT  --  201   Basic Metabolic Panel: Recent Labs  Lab 03/07/24 1927 03/07/24 2006  NA 136 136  K 4.0 4.0  CL 103 104  CO2  --  20*  GLUCOSE 76 80  BUN 11 11  CREATININE 1.00 0.96  CALCIUM   --  8.6*   GFR: Estimated Creatinine Clearance: 82.4 mL/min (by C-G formula based on SCr of 0.96 mg/dL). Liver Function Tests: Recent Labs  Lab 03/07/24 2006  AST 24  ALT 32  ALKPHOS 56  BILITOT 0.7  PROT 6.3*  ALBUMIN 3.6   No results for input(s): LIPASE, AMYLASE in the last 168 hours. No results for input(s): AMMONIA in the last 168 hours. Coagulation Profile: Recent Labs  Lab 03/07/24 2006  INR 1.1   Cardiac Enzymes: No results for input(s): CKTOTAL, CKMB, CKMBINDEX, TROPONINI in the last 168 hours. BNP (last 3 results) No results for input(s): PROBNP in the last 8760 hours. HbA1C: No results for input(s): HGBA1C in the last 72 hours. CBG: No results for input(s): GLUCAP in the last 168 hours. Lipid Profile: No results for input(s): CHOL, HDL, LDLCALC, TRIG, CHOLHDL, LDLDIRECT in the last 72 hours. Thyroid Function Tests: No results for input(s): TSH, T4TOTAL, FREET4, T3FREE, THYROIDAB in the last 72 hours. Anemia Panel: No results for input(s):  VITAMINB12, FOLATE, FERRITIN, TIBC, IRON, RETICCTPCT in the last 72 hours. Urine analysis:    Component Value Date/Time   BILIRUBINUR neg 11/23/2011 1135   PROTEINUR neg 11/23/2011 1135   UROBILINOGEN 0.2 11/23/2011 1135   NITRITE neg 11/23/2011  1135   LEUKOCYTESUR Negative 11/23/2011 1135    Radiological Exams on Admission: CT ANGIO HEAD NECK W WO CM (CODE STROKE) Result Date: 03/07/2024 EXAM: CTA HEAD AND NECK WITH AND WITHOUT 03/07/2024 07:35:33 PM TECHNIQUE: CTA of the head and neck was performed with and without the administration of 60 mL of iohexol  (OMNIPAQUE ) 350 MG/ML injection. Multiplanar 2D and/or 3D reformatted images are provided for review. Automated exposure control, iterative reconstruction, and/or weight based adjustment of the mA/kV was utilized to reduce the radiation dose to as low as reasonably achievable. Stenosis of the internal carotid arteries measured using NASCET criteria. COMPARISON: Same day CT head CLINICAL HISTORY: Neuro deficit, acute, stroke suspected. FINDINGS: CTA NECK: AORTIC ARCH AND ARCH VESSELS: Mild scattered atherosclerosis of the visualized aortic arch. Atherosclerosis of the proximal right subclavian artery without stenosis. No dissection or arterial injury. No significant stenosis of the brachiocephalic artery. CERVICAL CAROTID ARTERIES: Right carotid artery is patent from the origin to the skull base. Atherosclerosis of the right carotid bifurcation without stenosis. Additional atherosclerosis along the proximal right cervical ICA resulting in approximately 50% stenosis. Left carotid artery is patent from the origin to the skull base. Mild atherosclerosis of the left common carotid artery. Atherosclerosis at the left carotid bifurcation without hemodynamically significant stenosis. No dissection or arterial injury. CERVICAL VERTEBRAL ARTERIES: Vertebral arteries are patent from the origins to the vertebrobasilar confluence. Left vertebral artery  is dominant. Atherosclerosis of the right vertebral artery origin resulting in mild stenosis. There is tapering of the post PICA segment of the nondominant right vertebral artery. Atherosclerosis of the left V4 segment resulting in mild stenosis. No dissection or arterial injury. LUNGS AND MEDIASTINUM: Paraseptal and centrilobular emphysema in the upper lobes. SOFT TISSUES: Edentulous maxilla. No acute abnormality. BONES: Degenerative changes in the visualized spine. No acute abnormality. CTA HEAD: ANTERIOR CIRCULATION: Intracranial internal carotid arteries are patent bilaterally. Atherosclerosis of the bilateral carotid siphons. Atherosclerosis along the right periclinoid ICA resulting in moderate stenosis. Middle cerebral arteries are patent bilaterally. Anterior cerebral arteries are patent bilaterally. No aneurysm. POSTERIOR CIRCULATION: Basilar artery is patent and is primarily supplied via the left vertebral artery. There is distal tapering of the basilar artery. Significant hypoplasia of the basilar artery distal to the origins of the superior cerebellar arteries. Superior cerebellar arteries are patent bilaterally. Posterior communicating arteries are visualized bilaterally. Posterior cerebral arteries are supplied by the posterior communicating arteries. No significant stenosis of the vertebral arteries. No aneurysm. OTHER: No dural venous sinus thrombosis on this non-dedicated study. IMPRESSION: 1. No large vessel occlusion or aneurysm in the head or neck. 2. 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 3. Approximately 50% stenosis of the proximal right cervical ICA. 4. Moderate stenosis of the right paraclinoid ICA. 5. Mild stenosis of the right vertebral artery origin. 6. Paraseptal and centrilobular emphysema in the upper lobes. Consider evaluation for low-dose CT lung cancer screening program. Electronically signed by:  Donnice Mania MD 03/07/2024 07:58 PM EST RP Workstation: [id number]   CT HEAD CODE STROKE WO CONTRAST Result Date: 03/07/2024 CLINICAL DATA:  Code stroke. Initial evaluation for acute neuro deficit, stroke suspected. EXAM: CT HEAD WITHOUT CONTRAST TECHNIQUE: Contiguous axial images were obtained from the base of the skull through the vertex without intravenous contrast. RADIATION DOSE REDUCTION: This exam was performed according to the departmental dose-optimization program which includes automated exposure control, adjustment of the mA and/or kV according to patient size and/or use of  iterative reconstruction technique. COMPARISON:  None Available. FINDINGS: Brain: Cerebral volume within normal limits. No acute intracranial hemorrhage. No acute large vessel territory infarct. No mass lesion, midline shift or mass effect. No hydrocephalus or extra-axial fluid collection. Vascular: No abnormal hyperdense vessel. Scattered vascular calcifications noted within the carotid siphons. Skull: Scalp soft tissues demonstrate no acute finding. Calvarium intact. Sinuses/Orbits: Globes and orbital soft tissues within normal limits. Paranasal sinuses and mastoid air cells are largely clear. Other: None. ASPECTS [facility] Stroke Program Early CT Score) - Ganglionic level infarction (caudate, lentiform nuclei, internal capsule, insula, M1-M3 cortex): 7 - Supraganglionic infarction (M4-M6 cortex): 3 Total score (0-10 with 10 being normal): 10 IMPRESSION: 1. No acute intracranial abnormality. 2. ASPECTS is 10. These results were communicated to Dr. Vanessa at 7:38 pm on 03/07/2024 by text page via the [facility] messaging system. Electronically Signed   By: Morene Hoard M.D.   On: 03/07/2024 19:40      Assessment/Plan   Principal Problem:   Stroke-like symptoms Active Problems:   Paroxysmal atrial fibrillation with RVR (HCC)   History of COPD   History of essential hypertension   Chronic diastolic CHF (congestive  heart failure) (HCC)      #) Stroke-like symptoms: In the setting of acute onset of expressive aphasia, right upper extremity weakness, and right-sided vision changes starting around 1900 on 03/07/24, which have all subsequently improved to baseline, presentation is suspected to represent TIA vs acute ischemic CVA, with MRI brain currently pending. CT showed no e/o acute intracranial process, including no e/o acute intracranial hemorrhage and no e/o acute infarct. Additionally, CTA head and neck with IV contrast showed no evidence of large vessel occlusion.   EDP discussed patient's case and imaging with the on-call neurologist, Dr. Vanessa, who recommended admission to the hospitalist service for further stroke evaluation, including pursuit of MRI brain, as well as further assessment of potential modifiable ischemic CVA risk factors.  Neurology to formally consult, with additional recommendations to follow.  He received full dose aspirin  x 1 as well as Plavix 300 mg p.o. x 1 dose in the emergency department this evening. Will follow-up for result of MRI brain as well as additional neurology recommendations regarding ensuing plan from the standpoint of antiplatelet intervention.  At home he has been on daily baby aspirin .   In the context of new diagnosis of atrial fibrillation, the patient is also at increased risk for an embolic stroke.  At this time, he has been started on heparin  drip with MRI brain currently pending, and with additional evaluation and and management of new diagnosis of atrial fibrillation, as further detailed below.  His additional potential modifiable CVA risk factors include history of essential hypertension, as well as a history of being a former smoker.  No known history of underlying diabetes.  Not a candidate for TPA administration given improvement in the patient's initial symptoms.   With the exception of diltiazem drip for rate control relating to presenting atrial  fibrillation with RVR, will otherwise Will allow for permissive hypertension for 24-48 hours following onset of acute focal neurologic deficits, with associated parameters further quantified below, and with period of observance of permissive hypertension to end at 1900 on 03/09/24.    Plan: Nursing bedside swallow evaluation x 1 now, and will not initiate oral medications or diet until the patient has passed this. Head of the bed at 30 degrees. Neuro checks per protocol. VS per protocol. Will allow for permissive hypertension for 24-48 hours during  which will hold home antihypertensive medications, with prn IV labetalol ordered for SBP greater than 220 mmHg or DBP greater than 110 mmHg until  1900 on 03/09/24. Monitor on telemetry.  Further evaluation and management of new diagnosis of atrial fibrillation, as below, including continuation of heparin  drip initiated in the ED this evening.  Follow-up for result of MRI brain. TTE with bubble study has been ordered for the morning. Additionally, as component of evaluation of potential modifiable ischemic CVA risk factors, will also check lipid panel and A1c. PT/OT consults have been ordered for the morning. Additionally, as a standard component of work-up for acute ischemic cva, I've also ordered a speech therapy consult for the morning. Neurology to consult, as above.                       #) Atrial fibrillation with RVR: new diagnosis; In the setting of no known h/o of paroxysmal atrial fibrillation, noted to be in A-fib RVR with initial HR's in the 130's, subsequently decreasing into the low 100's following initiation of diltiazem drip . BP has tolerated both the RVR as well as ensuing initiation of diltiazem drip, without any overt hypotension.  Appears asymptomatic as a relates to his atrial fibrillation with RVR, raising possibility of a more chronic timeframe for the patient's atrial fibrillation.  His presenting atrial fibrillation is  notable given presenting acute ischemic CVA, with associated increased risk for embolic contribution, with MRI brain currently pending.  ACS felt to be less likely in the absence of any recent CP, while presenting EKG shows no evidence of acute ischemic changes.   Most recent echocardiogram occurred in March 2017 was notable for LVEF 55 to 60%, grade 1 diastolic dysfunction, as well as trace mitral regurgitation/trace tricuspid regurgitation. In the setting of CHA2DS2-VASc score of  4, there is an indication for chronic anticoagulation for thromboembolic prophylaxis.  He has been started on heparin  drip in the emergency department this evening. Home AV nodal blocking regimen: Coreg , which will be held for now in the setting of initiation of diltiazem drip in the emergency department as well as current observance of permissive hypertension, as above  Plan: Monitor strict I's & O's and daily weights. Monitor on telemetry. Check serum Mg level with prn supplementation to maintain levels of greater than or equal to 2.0. Repeat CMP/CBC in the AM.  Continue diltiazem drip.  Hold home Coreg  for now, close monitoring of ensuing BP via routine VS. continue heparin  drip.  Echocardiogram ordered for the morning.  Check TSH, urinalysis, urinary drug screen, chest x-ray, BNP.                           #) COPD: Documented history thereof, without clinical evidence of acute exacerbation at this time.     Plan: Prn Xopenex nebulizer. Check CMP and serum magnesium level in the AM.                         #) Essential Hypertension: documented h/o such, with outpatient antihypertensive regimen including Coreg .  SBP's in the ED today: 120s to 150s mmHg. in the setting of suspected presenting TIA/acute ischemic stroke, will hold home Coreg  for now and observance of permissive hypertension.   Plan: Close monitoring of subsequent BP via routine VS. hold home Coreg  for now and  observance of permissive hypertension, as above..  IV labetalol for systolic blood  pressure 220 mmHg. monitor on telemetry.  Further evaluation management of presenting new diagnosis of atrial fibrillation with RVR.                           #) Chronic diastolic heart failure: documented history of such, with most recent echocardiogram performed March 2017, which showed LVEF 55 to 60%, grade 1 diastolic dysfunction, with additional results as conveyed above. No clinical evidence to suggest acutely decompensated heart failure at this time, but, in the setting of new diagnosis of atrial fibrillation with RVR, we will pursue further evaluation of volume status, as outlined below.  home diuretic regimen reportedly consists of the following: None.   Plan: monitor strict I's & O's and daily weights. Repeat CMP in AM. Check serum mag level.  Check BNP.  Follow-up result of echocardiogram, as above.  Chest x-ray.      DVT prophylaxis: SCD's + Heparin  drip  Code Status: Full code Family Communication: none Disposition Plan: Per Rounding Team Consults called: EDP discussed with on-call neurology, Dr. Vanessa, who recommended hospitalist admission for further TIA/stroke work-up, including MRI brain, and conveyed that neurology will formally consult. ;  Admission status: osbs     I SPENT GREATER THAN 75  MINUTES IN CLINICAL CARE TIME/MEDICAL DECISION-MAKING IN COMPLETING THIS ADMISSION.      Eva NOVAK Billey Wojciak DO [facility]  From 7PM - 7AM   03/07/2024, 9:22 PM

## 2024-03-07 NOTE — ED Provider Notes (Signed)
 Gordon Hayes Provider Note  CSN: [id number] Arrival date & time: 03/07/24 1925  Chief Complaint(s) Aphasia  HPI Gordon Hayes is a 63 y.o. male history of coronary artery disease, COPD, hypertension, hyperlipidemia presenting to the emergency department with trouble speaking.  Patient reports that he was at dinner when spouse noticed sudden onset of trouble speaking, weakness in the right arm, right-sided headache.  Reports also some vision changes in the right side.  Paramedics were called.  Symptoms began around 6:47 PM.  He was activated as a code stroke.  On ER arrival symptoms were mostly resolved aside from some right hand weakness.  He reports no ongoing headache.  Denies any chest pain, palpitations, shortness of breath, abdominal pain.  Denies any similar episode.   Past Medical History Past Medical History:  Diagnosis Date   Back pain, lumbosacral    CAD S/P percutaneous coronary angioplasty 03/2015   OM2 99>0% w/ 2.75 mm x 16 mm (3.2 mm) Synergy DES; inferior OM2 branch 80%, med rx, pRCA 70%, mRCA 90% (s/p staged PCI w/ 2. 75 X 33 mm Xience Alpine DES);    Carpal tunnel syndrome, bilateral    COPD (chronic obstructive pulmonary disease) (HCC) 2013   DJD (degenerative joint disease) of knee    STEMI (ST elevation myocardial infarction) (HCC)  03/31/2015   Subtotal OM2 - PCI with DES. EF 45-50%; Staged PCI of RCA   Patient Active Problem List   Diagnosis Date Noted   TIA (transient ischemic attack) 03/07/2024   Quit smoking 11/04/2015   DJD (degenerative joint disease) of knee 11/04/2015   Carpal tunnel syndrome, bilateral 11/04/2015   Back pain, lumbosacral 11/04/2015   History of back surgery 11/04/2015   Snuff user 11/04/2015   Hyperlipidemia with target LDL less than 70 07/28/2015   Essential hypertension 03/31/2015   Atherosclerotic heart disease of native coronary artery without angina pectoris 03/31/2015   CAD S/P  percutaneous coronary angioplasty 03/27/2015   COPD (chronic obstructive pulmonary disease) (HCC) 11/23/2011   Home Medication(s) Prior to Admission medications   Medication Sig Start Date End Date Taking? Authorizing Provider  aspirin  EC 81 MG tablet Take 1 tablet (81 mg total) by mouth daily. 03/05/19   Anner Alm ORN, MD  carvedilol  (COREG ) 6.25 MG tablet TAKE 1 TABLET BY MOUTH TWICE A DAY 04/07/23   Monge, Damien BROCKS, NP  nitroGLYCERIN  (NITROSTAT ) 0.4 MG SL tablet Place 1 tablet (0.4 mg total) under the tongue every 5 (five) minutes as needed for chest pain. 04/06/22   Anner Alm ORN, MD  rosuvastatin  (CRESTOR ) 10 MG tablet Take 1 tablet (10 mg total) by mouth daily. 04/07/23   Daneen Damien BROCKS, NP                                                                                                                                    Past Surgical History  Past Surgical History:  Procedure Laterality Date   CARDIAC CATHETERIZATION N/A 03/31/2015   Procedure: Left Heart Cath and Coronary Angiography;  Surgeon: Alm LELON Clay, MD;  Location[facility] INVASIVE CV LAB;  Service: Cardiovascular;  Subtotoal OM2, 85% RCA with diffuse upstream Dz. EF 45-50%   CARDIAC CATHETERIZATION N/A 03/31/2015   Procedure: Coronary Stent Intervention;  Surgeon: Alm LELON Clay, MD;  Location: [facility] INVASIVE CV LAB;: OM2 99% - Synergy DES 2.75 mm x 16 mm (3.2 mm).    CARDIAC CATHETERIZATION N/A 04/02/2015   Procedure: Coronary Stent Intervention;  Surgeon: Deatrice DELENA Cage, MD;  Location: [facility] INVASIVE CV LAB: STAGED PCI  of RCA  90% - Xience DES 2.75 mm x 33 mm   HERNIA REPAIR     right inguinal   POSTERIOR LAMINECTOMY / DECOMPRESSION LUMBAR SPINE  2007   TONSILLECTOMY     TRANSTHORACIC ECHOCARDIOGRAM  06/2015   EF 55-60%, mild LVH. Normal wall motion. Gr 1 DD.    Family History Family History  Problem Relation Age of Onset   Hearing loss Mother    Hypertension Mother    Arthritis Mother    Cancer Father 73       prostate,  bladder   Heart disease Father    Stroke Father    Heart attack Father        2   Cancer Maternal Grandfather    Cancer Paternal Grandmother    Heart disease Paternal Grandfather    Colon cancer Neg Hx    Esophageal cancer Neg Hx    Rectal cancer Neg Hx    Stomach cancer Neg Hx     Social History Social History   Tobacco Use   Smoking status: Former    Current packs/day: 0.00    Average packs/day: 3.0 packs/day for 35.0 years (105.0 ttl pk-yrs)    Types: Cigarettes    Start date: 03/30/1980    Quit date: 03/31/2015    Years since quitting: 8.9   Smokeless tobacco: Former   Tobacco comments:    trying to-you gotta have some will power.  I don't have my mind right to do it yet.  Substance Use Topics   Alcohol use: Yes    Alcohol/week: 0.0 - 4.0 standard drinks of alcohol   Drug use: No   Allergies Penicillins and Atorvastatin   Review of Systems Review of Systems  All other systems reviewed and are negative.   Physical Exam Vital Signs  I have reviewed the triage vital signs BP (!) 142/125   Pulse (!) 120   Temp 97.7 F (36.5 C) (Oral)   Resp (!) 21   Ht 5' 10 (1.778 m)   Wt 85.2 kg   SpO2 97%   BMI 26.95 kg/m  Physical Exam Vitals and nursing note reviewed.  Constitutional:      General: He is not in acute distress.    Appearance: Normal appearance.  HENT:     Mouth/Throat:     Mouth: Mucous membranes are moist.  Eyes:     Conjunctiva/sclera: Conjunctivae normal.  Cardiovascular:     Rate and Rhythm: Tachycardia present. Rhythm irregular.  Pulmonary:     Effort: Pulmonary effort is normal. No respiratory distress.     Breath sounds: Normal breath sounds.  Abdominal:     General: Abdomen is flat.     Palpations: Abdomen is soft.     Tenderness: There is no abdominal tenderness.  Musculoskeletal:     Right lower leg: No edema.  Left lower leg: No edema.  Skin:    General: Skin is warm and dry.     Capillary Refill: Capillary refill takes  less than 2 seconds.  Neurological:     Mental Status: He is alert and oriented to person, place, and time. Mental status is at baseline.     Comments: Cranial nerves II through XII intact, strength 5 out of 5 in the bilateral upper and lower extremities, no sensory deficit to light touch, no dysmetria on finger-nose-finger testing  Psychiatric:        Mood and Affect: Mood normal.        Behavior: Behavior normal.     ED Results and Treatments Labs (all labs ordered are listed, but only abnormal results are displayed) Labs Reviewed  COMPREHENSIVE METABOLIC PANEL WITH GFR - Abnormal; Notable for the following components:      Result Value   CO2 20 (*)    Calcium  8.6 (*)    Total Protein 6.3 (*)    All other components within normal limits  ETHANOL - Abnormal; Notable for the following components:   Alcohol, Ethyl (B) 32 (*)    All other components within normal limits  I-STAT CHEM 8, ED - Abnormal; Notable for the following components:   Calcium , Ion 1.04 (*)    TCO2 21 (*)    All other components within normal limits  PROTIME-INR  APTT  CBC  DIFFERENTIAL  HEPARIN  LEVEL (UNFRACTIONATED)  CBG MONITORING, ED                                                                                                                          Radiology CT ANGIO HEAD NECK W WO CM (CODE STROKE) Result Date: 03/07/2024 EXAM: CTA HEAD AND NECK WITH AND WITHOUT 03/07/2024 07:35:33 PM TECHNIQUE: CTA of the head and neck was performed with and without the administration of 60 mL of iohexol  (OMNIPAQUE ) 350 MG/ML injection. Multiplanar 2D and/or 3D reformatted images are provided for review. Automated exposure control, iterative reconstruction, and/or weight based adjustment of the mA/kV was utilized to reduce the radiation dose to as low as reasonably achievable. Stenosis of the internal carotid arteries measured using NASCET criteria. COMPARISON: Same day CT head CLINICAL HISTORY: Neuro deficit, acute,  stroke suspected. FINDINGS: CTA NECK: AORTIC ARCH AND ARCH VESSELS: Mild scattered atherosclerosis of the visualized aortic arch. Atherosclerosis of the proximal right subclavian artery without stenosis. No dissection or arterial injury. No significant stenosis of the brachiocephalic artery. CERVICAL CAROTID ARTERIES: Right carotid artery is patent from the origin to the skull base. Atherosclerosis of the right carotid bifurcation without stenosis. Additional atherosclerosis along the proximal right cervical ICA resulting in approximately 50% stenosis. Left carotid artery is patent from the origin to the skull base. Mild atherosclerosis of the left common carotid artery. Atherosclerosis at the left carotid bifurcation without hemodynamically significant stenosis. No dissection or arterial injury. CERVICAL VERTEBRAL ARTERIES: Vertebral arteries are patent from the origins to the  vertebrobasilar confluence. Left vertebral artery is dominant. Atherosclerosis of the right vertebral artery origin resulting in mild stenosis. There is tapering of the post PICA segment of the nondominant right vertebral artery. Atherosclerosis of the left V4 segment resulting in mild stenosis. No dissection or arterial injury. LUNGS AND MEDIASTINUM: Paraseptal and centrilobular emphysema in the upper lobes. SOFT TISSUES: Edentulous maxilla. No acute abnormality. BONES: Degenerative changes in the visualized spine. No acute abnormality. CTA HEAD: ANTERIOR CIRCULATION: Intracranial internal carotid arteries are patent bilaterally. Atherosclerosis of the bilateral carotid siphons. Atherosclerosis along the right periclinoid ICA resulting in moderate stenosis. Middle cerebral arteries are patent bilaterally. Anterior cerebral arteries are patent bilaterally. No aneurysm. POSTERIOR CIRCULATION: Basilar artery is patent and is primarily supplied via the left vertebral artery. There is distal tapering of the basilar artery. Significant hypoplasia  of the basilar artery distal to the origins of the superior cerebellar arteries. Superior cerebellar arteries are patent bilaterally. Posterior communicating arteries are visualized bilaterally. Posterior cerebral arteries are supplied by the posterior communicating arteries. No significant stenosis of the vertebral arteries. No aneurysm. OTHER: No dural venous sinus thrombosis on this non-dedicated study. IMPRESSION: 1. No large vessel occlusion or aneurysm in the head or neck. 2. 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 3. Approximately 50% stenosis of the proximal right cervical ICA. 4. Moderate stenosis of the right paraclinoid ICA. 5. Mild stenosis of the right vertebral artery origin. 6. Paraseptal and centrilobular emphysema in the upper lobes. Consider evaluation for low-dose CT lung cancer screening program. Electronically signed by: Donnice Mania MD 03/07/2024 07:58 PM EST RP Workstation: [id number]   CT HEAD CODE STROKE WO CONTRAST Result Date: 03/07/2024 CLINICAL DATA:  Code stroke. Initial evaluation for acute neuro deficit, stroke suspected. EXAM: CT HEAD WITHOUT CONTRAST TECHNIQUE: Contiguous axial images were obtained from the base of the skull through the vertex without intravenous contrast. RADIATION DOSE REDUCTION: This exam was performed according to the departmental dose-optimization program which includes automated exposure control, adjustment of the mA and/or kV according to patient size and/or use of iterative reconstruction technique. COMPARISON:  None Available. FINDINGS: Brain: Cerebral volume within normal limits. No acute intracranial hemorrhage. No acute large vessel territory infarct. No mass lesion, midline shift or mass effect. No hydrocephalus or extra-axial fluid collection. Vascular: No abnormal hyperdense vessel. Scattered vascular calcifications noted within the carotid siphons. Skull:  Scalp soft tissues demonstrate no acute finding. Calvarium intact. Sinuses/Orbits: Globes and orbital soft tissues within normal limits. Paranasal sinuses and mastoid air cells are largely clear. Other: None. ASPECTS [facility] Stroke Program Early CT Score) - Ganglionic level infarction (caudate, lentiform nuclei, internal capsule, insula, M1-M3 cortex): 7 - Supraganglionic infarction (M4-M6 cortex): 3 Total score (0-10 with 10 being normal): 10 IMPRESSION: 1. No acute intracranial abnormality. 2. ASPECTS is 10. These results were communicated to Dr. Vanessa at 7:38 pm on 03/07/2024 by text page via the [facility] messaging system. Electronically Signed   By: Morene Hoard M.D.   On: 03/07/2024 19:40    Pertinent labs & imaging results that were available during my care of the patient were reviewed by me and considered in my medical decision making (see MDM for details).  Medications Ordered in ED Medications  diltiazem (CARDIZEM) 1 mg/mL load via infusion 20 mg (20 mg Intravenous Bolus from Bag 03/07/24 2043)    And  diltiazem (CARDIZEM) 125 mg in dextrose 5% 125 mL (1 mg/mL) infusion (0 mg/hr Intravenous Paused 03/07/24  2054)  heparin  ADULT infusion 100 units/mL (25000 units/250mL) (has no administration in time range)  sodium chloride  flush (NS) 0.9 % injection 3 mL (3 mLs Intravenous Given 03/07/24 2024)  iohexol  (OMNIPAQUE ) 350 MG/ML injection 60 mL (60 mLs Intravenous Contrast Given 03/07/24 1936)  aspirin  tablet 325 mg (325 mg Oral Given 03/07/24 2000)  clopidogrel (PLAVIX) tablet 300 mg (300 mg Oral Given 03/07/24 2000)                                                                                                                                     Procedures .Critical Care  Performed by: Francesca Elsie CROME, MD Authorized by: Francesca Elsie CROME, MD   Critical care provider statement:    Critical care time (minutes):  35   Critical care was necessary to treat or prevent imminent  or life-threatening deterioration of the following conditions:  CNS failure or compromise and cardiac failure   Critical care was time spent personally by me on the following activities:  Development of treatment plan with patient or surrogate, discussions with consultants, evaluation of patient's response to treatment, examination of patient, ordering and review of laboratory studies, ordering and review of radiographic studies, ordering and performing treatments and interventions, pulse oximetry, re-evaluation of patient's condition and review of old charts   Care discussed with: admitting provider     (including critical care time)  Medical Decision Making / ED Course   MDM:  63 year old presenting to the emergency department with right-sided weakness, aphasia.  On my evaluation, patient's symptoms have largely resolved.  Heart rate is elevated and monitor/ECG shows evidence of A-fib with RVR.  Differential includes stroke, TIA, intracranial bleeding.  CT scans without evidence of acute process.  TNK considered however given rapidly improving symptoms/return to baseline was not given.  Patient not EVT candidate given no LVO.  CT scan without evidence of any intracranial bleeding to explain symptoms.  Given presence of A-fib with RVR concern for possible cardioembolic source.  Discussed with neurology who recommend initiation of heparin .  Will also start patient on diltiazem.  He will obtain MRI for completion of workup.  He will require admission for further inpatient workup as well.  Discussed with patient and the family who are in agreement with this.  Clinical Course as of 03/07/24 2116  Wed Mar 07, 2024  2115 Patient has been admitted by Dr. Marcene  [WS]    Clinical Course User Index [WS] Francesca Elsie CROME, MD     Additional history obtained: -Additional history obtained from ems and spouse -External records from outside source obtained and reviewed including: Chart review  including previous notes, labs, imaging, consultation notes including prior notes    Lab Tests: -I ordered, reviewed, and interpreted labs.   The pertinent results include:   Labs Reviewed  COMPREHENSIVE METABOLIC PANEL WITH GFR - Abnormal; Notable for the following components:  Result Value   CO2 20 (*)    Calcium  8.6 (*)    Total Protein 6.3 (*)    All other components within normal limits  ETHANOL - Abnormal; Notable for the following components:   Alcohol, Ethyl (B) 32 (*)    All other components within normal limits  I-STAT CHEM 8, ED - Abnormal; Notable for the following components:   Calcium , Ion 1.04 (*)    TCO2 21 (*)    All other components within normal limits  PROTIME-INR  APTT  CBC  DIFFERENTIAL  HEPARIN  LEVEL (UNFRACTIONATED)  CBG MONITORING, ED    Notable for mild elevated etoh level   EKG  Afib rvr  Imaging Studies ordered: I ordered imaging studies including CT head  On my interpretation imaging demonstrates no acute process  I independently visualized and interpreted imaging. I agree with the radiologist interpretation   Medicines ordered and prescription drug management: Meds ordered this encounter  Medications   sodium chloride  flush (NS) 0.9 % injection 3 mL   iohexol  (OMNIPAQUE ) 350 MG/ML injection 60 mL   aspirin  tablet 325 mg   clopidogrel (PLAVIX) tablet 300 mg   DISCONTD: aspirin  EC tablet 81 mg   DISCONTD: clopidogrel (PLAVIX) tablet 75 mg   AND Linked Order Group    diltiazem (CARDIZEM) 1 mg/mL load via infusion 20 mg    diltiazem (CARDIZEM) 125 mg in dextrose 5% 125 mL (1 mg/mL) infusion   DISCONTD: heparin  injection 4,000 Units   heparin  ADULT infusion 100 units/mL (25000 units/250mL)   dilTIAZem HCl-Dextrose 125-5 MG/125ML-% infusion    Bordelon, Trey: cabinet override    -I have reviewed the patients home medicines and have made adjustments as needed   Consultations Obtained: I requested consultation with the neurologist,   and discussed lab and imaging findings as well as pertinent plan - they recommend: admit   Cardiac Monitoring: The patient was maintained on a cardiac monitor.  I personally viewed and interpreted the cardiac monitored which showed an underlying rhythm of: afib rvr   Social Determinants of Health:  Diagnosis or treatment significantly limited by social determinants of health: former smoker and alcohol use   Reevaluation: After the interventions noted above, I reevaluated the patient and found that their symptoms have resolved  Co morbidities that complicate the patient evaluation  Past Medical History:  Diagnosis Date   Back pain, lumbosacral    CAD S/P percutaneous coronary angioplasty 03/2015   OM2 99>0% w/ 2.75 mm x 16 mm (3.2 mm) Synergy DES; inferior OM2 branch 80%, med rx, pRCA 70%, mRCA 90% (s/p staged PCI w/ 2. 75 X 33 mm Xience Alpine DES);    Carpal tunnel syndrome, bilateral    COPD (chronic obstructive pulmonary disease) (HCC) 2013   DJD (degenerative joint disease) of knee    STEMI (ST elevation myocardial infarction) (HCC)  03/31/2015   Subtotal OM2 - PCI with DES. EF 45-50%; Staged PCI of RCA      Dispostion: Disposition decision including need for hospitalization was considered, and patient admitted to the hospital.    Final Clinical Impression(s) / ED Diagnoses Final diagnoses:  TIA (transient ischemic attack)  Atrial fibrillation with rapid ventricular response (HCC)     This chart was dictated using voice recognition software.  Despite best efforts to proofread,  errors can occur which can change the documentation meaning.    Francesca Elsie CROME, MD 03/07/24 2116

## 2024-03-07 NOTE — Progress Notes (Signed)
 ANTICOAGULATION CONSULT NOTE  Pharmacy Consult for Heparin  Indication: atrial fibrillation and stroke  Allergies  Allergen Reactions   Penicillins Anaphylaxis   Atorvastatin  Other (See Comments)    myalgias    Patient Measurements: Weight: 85.2 kg (187 lb 13.3 oz) Heparin  Dosing Weight: 85.2 kg  Vital Signs: Temp: 97.7 F (36.5 C) (11/12 1945) Temp Source: Oral (11/12 1945) BP: 125/115 (11/12 2000) Pulse Rate: 59 (11/12 2000)  Labs: Recent Labs    03/07/24 1927 03/07/24 2006  HGB 16.0 15.2  HCT 47.0 46.7  PLT  --  201  APTT  --  29  LABPROT  --  14.3  INR  --  1.1  CREATININE 1.00  --     CrCl cannot be calculated (Unknown ideal weight.).   Medical History: Past Medical History:  Diagnosis Date   Back pain, lumbosacral    CAD S/P percutaneous coronary angioplasty 03/2015   OM2 99>0% w/ 2.75 mm x 16 mm (3.2 mm) Synergy DES; inferior OM2 branch 80%, med rx, pRCA 70%, mRCA 90% (s/p staged PCI w/ 2. 75 X 33 mm Xience Alpine DES);    Carpal tunnel syndrome, bilateral    COPD (chronic obstructive pulmonary disease) (HCC) 2013   DJD (degenerative joint disease) of knee    STEMI (ST elevation myocardial infarction) (HCC)  03/31/2015   Subtotal OM2 - PCI with DES. EF 45-50%; Staged PCI of RCA   Assessment: 59 yom with a history of CAD, STEMI, COPD. Patient is presenting with right side weakness and Code stroke activation. Heparin  per pharmacy consult placed for atrial fibrillation and stroke.  Patient is not on anticoagulation prior to arrival.  Hgb 15.2; plt 201 PT/INR 1.3/1.1 aPTT 29  Goal of Therapy:  Heparin  level 0.3-0.5 units/ml Monitor platelets by anticoagulation protocol: Yes   Plan:  No initial heparin  bolus Start heparin  infusion at 1000 units/hr Check anti-Xa level in 8 hours and daily while on heparin  Continue to monitor H&H and platelets  Dorn Buttner, PharmD, BCPS 03/07/2024 8:35 PM ED Clinical Pharmacist -  [phone]

## 2024-03-07 NOTE — ED Notes (Signed)
 Patient going to MRI, however has muliple IV infusions ordered. Per MD Scheving, will administer Diltiazem bolus to patient and pause other drips until he returns from MRI.

## 2024-03-07 NOTE — ED Notes (Signed)
 CCMD called to place pt on cardiac monitoring.

## 2024-03-07 NOTE — ED Triage Notes (Signed)
 Patient at dinner with spouse where she noticed sudden onset of aphasias, right arm weakness/deficit with right sided headache behind eyeball. Last known well of 1824. No hx of stroke, denies blood thinners. Upon arrival to ER most symptoms resolved. Patient still reporting right sided arm weakness and pain behind right eyeball.

## 2024-03-08 ENCOUNTER — Telehealth (HOSPITAL_COMMUNITY): Payer: Self-pay

## 2024-03-08 ENCOUNTER — Observation Stay (HOSPITAL_COMMUNITY)

## 2024-03-08 ENCOUNTER — Other Ambulatory Visit (HOSPITAL_COMMUNITY): Payer: Self-pay

## 2024-03-08 DIAGNOSIS — I10 Essential (primary) hypertension: Secondary | ICD-10-CM | POA: Diagnosis not present

## 2024-03-08 DIAGNOSIS — Z8673 Personal history of transient ischemic attack (TIA), and cerebral infarction without residual deficits: Secondary | ICD-10-CM

## 2024-03-08 DIAGNOSIS — I6389 Other cerebral infarction: Secondary | ICD-10-CM | POA: Diagnosis not present

## 2024-03-08 DIAGNOSIS — I4891 Unspecified atrial fibrillation: Secondary | ICD-10-CM | POA: Diagnosis not present

## 2024-03-08 DIAGNOSIS — R2990 Unspecified symptoms and signs involving the nervous system: Secondary | ICD-10-CM | POA: Diagnosis present

## 2024-03-08 DIAGNOSIS — I509 Heart failure, unspecified: Secondary | ICD-10-CM

## 2024-03-08 DIAGNOSIS — Z7982 Long term (current) use of aspirin: Secondary | ICD-10-CM | POA: Diagnosis not present

## 2024-03-08 DIAGNOSIS — I5032 Chronic diastolic (congestive) heart failure: Secondary | ICD-10-CM | POA: Diagnosis not present

## 2024-03-08 DIAGNOSIS — F10920 Alcohol use, unspecified with intoxication, uncomplicated: Secondary | ICD-10-CM | POA: Diagnosis not present

## 2024-03-08 DIAGNOSIS — Z8709 Personal history of other diseases of the respiratory system: Secondary | ICD-10-CM

## 2024-03-08 DIAGNOSIS — I639 Cerebral infarction, unspecified: Secondary | ICD-10-CM

## 2024-03-08 DIAGNOSIS — Z79899 Other long term (current) drug therapy: Secondary | ICD-10-CM | POA: Diagnosis not present

## 2024-03-08 DIAGNOSIS — E785 Hyperlipidemia, unspecified: Secondary | ICD-10-CM | POA: Diagnosis not present

## 2024-03-08 DIAGNOSIS — I6501 Occlusion and stenosis of right vertebral artery: Secondary | ICD-10-CM | POA: Diagnosis not present

## 2024-03-08 DIAGNOSIS — I110 Hypertensive heart disease with heart failure: Secondary | ICD-10-CM | POA: Diagnosis not present

## 2024-03-08 DIAGNOSIS — J449 Chronic obstructive pulmonary disease, unspecified: Secondary | ICD-10-CM | POA: Diagnosis not present

## 2024-03-08 DIAGNOSIS — Z8679 Personal history of other diseases of the circulatory system: Secondary | ICD-10-CM | POA: Diagnosis not present

## 2024-03-08 DIAGNOSIS — J432 Centrilobular emphysema: Secondary | ICD-10-CM | POA: Diagnosis not present

## 2024-03-08 DIAGNOSIS — I6521 Occlusion and stenosis of right carotid artery: Secondary | ICD-10-CM | POA: Diagnosis not present

## 2024-03-08 LAB — HEPARIN LEVEL (UNFRACTIONATED)
Heparin Unfractionated: 0.14 [IU]/mL — ABNORMAL LOW (ref 0.30–0.70)
Heparin Unfractionated: 0.15 [IU]/mL — ABNORMAL LOW (ref 0.30–0.70)

## 2024-03-08 LAB — COMPREHENSIVE METABOLIC PANEL WITH GFR
ALT: 27 U/L (ref 0–44)
AST: 19 U/L (ref 15–41)
Albumin: 3.3 g/dL — ABNORMAL LOW (ref 3.5–5.0)
Alkaline Phosphatase: 54 U/L (ref 38–126)
Anion gap: 11 (ref 5–15)
BUN: 9 mg/dL (ref 8–23)
CO2: 20 mmol/L — ABNORMAL LOW (ref 22–32)
Calcium: 8.6 mg/dL — ABNORMAL LOW (ref 8.9–10.3)
Chloride: 106 mmol/L (ref 98–111)
Creatinine, Ser: 0.92 mg/dL (ref 0.61–1.24)
GFR, Estimated: >60 mL/min (ref >60)
Glucose, Bld: 85 mg/dL (ref 70–99)
Potassium: 4 mmol/L (ref 3.5–5.1)
Sodium: 137 mmol/L (ref 135–145)
Total Bilirubin: 1 mg/dL (ref 0.0–1.2)
Total Protein: 6 g/dL — ABNORMAL LOW (ref 6.5–8.1)

## 2024-03-08 LAB — CBC WITH DIFFERENTIAL/PLATELET
Abs Immature Granulocytes: 0.01 K/uL (ref 0.00–0.07)
Basophils Absolute: 0 K/uL (ref 0.0–0.1)
Basophils Relative: 1 %
Eosinophils Absolute: 0.1 K/uL (ref 0.0–0.5)
Eosinophils Relative: 2 %
HCT: 44.8 % (ref 39.0–52.0)
Hemoglobin: 14.8 g/dL (ref 13.0–17.0)
Immature Granulocytes: 0 %
Lymphocytes Relative: 24 %
Lymphs Abs: 1.5 K/uL (ref 0.7–4.0)
MCH: 30.3 pg (ref 26.0–34.0)
MCHC: 33 g/dL (ref 30.0–36.0)
MCV: 91.6 fL (ref 80.0–100.0)
Monocytes Absolute: 0.6 K/uL (ref 0.1–1.0)
Monocytes Relative: 10 %
Neutro Abs: 3.9 K/uL (ref 1.7–7.7)
Neutrophils Relative %: 63 %
Platelets: 178 K/uL (ref 150–400)
RBC: 4.89 MIL/uL (ref 4.22–5.81)
RDW: 13.8 % (ref 11.5–15.5)
WBC: 6.2 K/uL (ref 4.0–10.5)
nRBC: 0 % (ref 0.0–0.2)

## 2024-03-08 LAB — ECHOCARDIOGRAM COMPLETE BUBBLE STUDY: S' Lateral: 4.5 cm

## 2024-03-08 LAB — LIPID PANEL
Cholesterol: 100 mg/dL (ref 0–200)
HDL: 53 mg/dL (ref >40)
LDL Cholesterol: 41 mg/dL (ref 0–99)
Total CHOL/HDL Ratio: 1.9 ratio
Triglycerides: 29 mg/dL (ref <150)
VLDL: 6 mg/dL (ref 0–40)

## 2024-03-08 LAB — MAGNESIUM: Magnesium: 2 mg/dL (ref 1.7–2.4)

## 2024-03-08 LAB — GLUCOSE, CAPILLARY: Glucose-Capillary: 81 mg/dL (ref 70–99)

## 2024-03-08 LAB — TSH: TSH: 1.776 u[IU]/mL (ref 0.350–4.500)

## 2024-03-08 MED ORDER — DILTIAZEM HCL ER COATED BEADS 180 MG PO CP24
180.0000 mg | ORAL_CAPSULE | Freq: Every day | ORAL | Status: DC
  Administered 2024-03-08: 180 mg via ORAL
  Filled 2024-03-08: qty 1

## 2024-03-08 MED ORDER — BISOPROLOL FUMARATE 5 MG PO TABS: 5.0000 mg | ORAL_TABLET | Freq: Every day | ORAL | Status: DC

## 2024-03-08 MED ORDER — APIXABAN 5 MG PO TABS
5.0000 mg | ORAL_TABLET | Freq: Two times a day (BID) | ORAL | Status: DC
  Administered 2024-03-08 – 2024-03-09 (×2): 5 mg via ORAL
  Filled 2024-03-08 (×2): qty 1

## 2024-03-08 MED ORDER — BISOPROLOL FUMARATE 10 MG PO TABS
10.0000 mg | ORAL_TABLET | Freq: Every day | ORAL | Status: DC
  Administered 2024-03-09: 10 mg via ORAL
  Filled 2024-03-08: qty 1

## 2024-03-08 NOTE — Progress Notes (Signed)
 SLP Cancellation Note  Patient Details Name: Gordon Hayes MRN: [id number] DOB: March 30, 1961   Cancelled treatment:       Reason Eval/Treat Not Completed: SLP screened. Pt and his family report aphasia resolved upon arrival to [facility]. They otherwise deny acute changes to cognition. No needs identified, will sign off.    Damien Blumenthal, M.A., CCC-SLP Speech Language Pathology, Acute Rehabilitation Services  Secure Chat preferred [phone]  03/08/2024, 10:03 AM

## 2024-03-08 NOTE — Plan of Care (Signed)
   Problem: Education: Goal: Knowledge of disease or condition will improve Outcome: Progressing   Problem: Ischemic Stroke/TIA Tissue Perfusion: Goal: Complications of ischemic stroke/TIA will be minimized Outcome: Progressing   Problem: Coping: Goal: Will verbalize positive feelings about self Outcome: Progressing Goal: Will identify appropriate support needs Outcome: Progressing

## 2024-03-08 NOTE — Progress Notes (Addendum)
 STROKE TEAM PROGRESS NOTE    INTERIM HISTORY/SUBJECTIVE  Patient remains hemodynamically stable and afebrile overnight.  He reports that his aphasia and right-sided weakness have completely resolved.  OBJECTIVE  CBC    Component Value Date/Time   WBC 6.2 03/08/2024 0427   RBC 4.89 03/08/2024 0427   HGB 14.8 03/08/2024 0427   HGB 14.7 07/25/2023 0909   HCT 44.8 03/08/2024 0427   HCT 44.9 07/25/2023 0909   PLT 178 03/08/2024 0427   PLT 206 07/25/2023 0909   MCV 91.6 03/08/2024 0427   MCV 94 07/25/2023 0909   MCH 30.3 03/08/2024 0427   MCHC 33.0 03/08/2024 0427   RDW 13.8 03/08/2024 0427   RDW 13.0 07/25/2023 0909   LYMPHSABS 1.5 03/08/2024 0427   LYMPHSABS 1.9 07/25/2023 0909   MONOABS 0.6 03/08/2024 0427   EOSABS 0.1 03/08/2024 0427   EOSABS 0.1 07/25/2023 0909   BASOSABS 0.0 03/08/2024 0427   BASOSABS 0.0 07/25/2023 0909    BMET    Component Value Date/Time   NA 137 03/08/2024 0427   NA 140 04/07/2023 1200   K 4.0 03/08/2024 0427   CL 106 03/08/2024 0427   CO2 20 (L) 03/08/2024 0427   GLUCOSE 85 03/08/2024 0427   BUN 9 03/08/2024 0427   BUN 12 04/07/2023 1200   CREATININE 0.92 03/08/2024 0427   CREATININE 0.84 11/03/2015 0850   CALCIUM  8.6 (L) 03/08/2024 0427   EGFR 99 04/07/2023 1200   GFRNONAA >60 03/08/2024 0427   GFRNONAA >89 11/03/2015 0850    IMAGING past 24 hours DG Chest Port 1 View Result Date: 03/07/2024 EXAM: 1 VIEW(S) XRAY OF THE CHEST 03/07/2024 10:42:00 PM COMPARISON: 04/01/2015 CLINICAL HISTORY: Atrial fibrillation (HCC) FINDINGS: LUNGS AND PLEURA: No focal pulmonary opacity. No pleural effusion. No pneumothorax. HEART AND MEDIASTINUM: No acute abnormality of the cardiac and mediastinal silhouettes. BONES AND SOFT TISSUES: No acute osseous abnormality. IMPRESSION: 1. No acute cardiopulmonary process. Electronically signed by: Pinkie Pebbles MD 03/07/2024 10:46 PM EST RP Workstation: [id number]   MR BRAIN WO CONTRAST Result Date:  03/07/2024 CLINICAL DATA:  Initial evaluation for acute neuro deficit, stroke suspected. EXAM: MRI HEAD WITHOUT CONTRAST TECHNIQUE: Multiplanar, multiecho pulse sequences of the brain and surrounding structures were obtained without intravenous contrast. COMPARISON:  Comparison made with CTs from earlier the same day. FINDINGS: Brain: Cerebral volume within normal limits for age. Patchy T2/FLAIR hyperintensity involving the periventricular and deep white matter both cerebral hemispheres, consistent chronic small vessel ischemic disease, mild for age. Question subtle foci of diffusion signal abnormality involving the left insular cortex and overlying left frontal lobe (series 5, images 78, 82). Findings are extremely subtle in nature, and could potentially be artifactual, although an underlying subtle acute left MCA distribution infarct could also be considered in the correct clinical setting. No associated mass effect. Small punctate chronic microhemorrhage noted within this region without definite associated w hemorrhage (series 12, image 36). No other evidence for acute or subacute ischemia. Gray-white matter differentiation otherwise maintained. No other acute or chronic intracranial blood products. No mass lesion, midline shift or mass effect. No hydrocephalus or extra-axial fluid collection. Pituitary gland within normal limits. Vascular: Major intracranial vascular flow voids are maintained. Skull and upper cervical spine: Craniocervical junction within normal limits. Bone marrow signal intensity normal. No scalp soft tissue abnormality. Sinuses/Orbits: Globes and orbital soft tissues within normal limits. Paranasal sinuses are largely clear. No significant mastoid effusion. Other: None. IMPRESSION: 1. Question few small foci of diffusion signal abnormality involving  the left insular cortex and overlying left frontal lobe. Findings are extremely subtle in nature, and could potentially be artifactual, although  an underlying subtle acute nonhemorrhagic left MCA distribution infarct could also be considered in the correct clinical setting. 2. No other acute intracranial abnormality. 3. Underlying mild chronic microvascular ischemic disease. Electronically Signed   By: Morene Hoard M.D.   On: 03/07/2024 22:01   CT ANGIO HEAD NECK W WO CM (CODE STROKE) Result Date: 03/07/2024 EXAM: CTA HEAD AND NECK WITH AND WITHOUT 03/07/2024 07:35:33 PM TECHNIQUE: CTA of the head and neck was performed with and without the administration of 60 mL of iohexol  (OMNIPAQUE ) 350 MG/ML injection. Multiplanar 2D and/or 3D reformatted images are provided for review. Automated exposure control, iterative reconstruction, and/or weight based adjustment of the mA/kV was utilized to reduce the radiation dose to as low as reasonably achievable. Stenosis of the internal carotid arteries measured using NASCET criteria. COMPARISON: Same day CT head CLINICAL HISTORY: Neuro deficit, acute, stroke suspected. FINDINGS: CTA NECK: AORTIC ARCH AND ARCH VESSELS: Mild scattered atherosclerosis of the visualized aortic arch. Atherosclerosis of the proximal right subclavian artery without stenosis. No dissection or arterial injury. No significant stenosis of the brachiocephalic artery. CERVICAL CAROTID ARTERIES: Right carotid artery is patent from the origin to the skull base. Atherosclerosis of the right carotid bifurcation without stenosis. Additional atherosclerosis along the proximal right cervical ICA resulting in approximately 50% stenosis. Left carotid artery is patent from the origin to the skull base. Mild atherosclerosis of the left common carotid artery. Atherosclerosis at the left carotid bifurcation without hemodynamically significant stenosis. No dissection or arterial injury. CERVICAL VERTEBRAL ARTERIES: Vertebral arteries are patent from the origins to the vertebrobasilar confluence. Left vertebral artery is dominant. Atherosclerosis of the  right vertebral artery origin resulting in mild stenosis. There is tapering of the post PICA segment of the nondominant right vertebral artery. Atherosclerosis of the left V4 segment resulting in mild stenosis. No dissection or arterial injury. LUNGS AND MEDIASTINUM: Paraseptal and centrilobular emphysema in the upper lobes. SOFT TISSUES: Edentulous maxilla. No acute abnormality. BONES: Degenerative changes in the visualized spine. No acute abnormality. CTA HEAD: ANTERIOR CIRCULATION: Intracranial internal carotid arteries are patent bilaterally. Atherosclerosis of the bilateral carotid siphons. Atherosclerosis along the right periclinoid ICA resulting in moderate stenosis. Middle cerebral arteries are patent bilaterally. Anterior cerebral arteries are patent bilaterally. No aneurysm. POSTERIOR CIRCULATION: Basilar artery is patent and is primarily supplied via the left vertebral artery. There is distal tapering of the basilar artery. Significant hypoplasia of the basilar artery distal to the origins of the superior cerebellar arteries. Superior cerebellar arteries are patent bilaterally. Posterior communicating arteries are visualized bilaterally. Posterior cerebral arteries are supplied by the posterior communicating arteries. No significant stenosis of the vertebral arteries. No aneurysm. OTHER: No dural venous sinus thrombosis on this non-dedicated study. IMPRESSION: 1. No large vessel occlusion or aneurysm in the head or neck. 2. 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 3. Approximately 50% stenosis of the proximal right cervical ICA. 4. Moderate stenosis of the right paraclinoid ICA. 5. Mild stenosis of the right vertebral artery origin. 6. Paraseptal and centrilobular emphysema in the upper lobes. Consider evaluation for low-dose CT lung cancer screening program. Electronically signed by: Donnice Mania MD 03/07/2024 07:58 PM  EST RP Workstation: [id number]   CT HEAD CODE STROKE WO CONTRAST Result Date: 03/07/2024 CLINICAL DATA:  Code stroke. Initial evaluation for acute neuro deficit, stroke  suspected. EXAM: CT HEAD WITHOUT CONTRAST TECHNIQUE: Contiguous axial images were obtained from the base of the skull through the vertex without intravenous contrast. RADIATION DOSE REDUCTION: This exam was performed according to the departmental dose-optimization program which includes automated exposure control, adjustment of the mA and/or kV according to patient size and/or use of iterative reconstruction technique. COMPARISON:  None Available. FINDINGS: Brain: Cerebral volume within normal limits. No acute intracranial hemorrhage. No acute large vessel territory infarct. No mass lesion, midline shift or mass effect. No hydrocephalus or extra-axial fluid collection. Vascular: No abnormal hyperdense vessel. Scattered vascular calcifications noted within the carotid siphons. Skull: Scalp soft tissues demonstrate no acute finding. Calvarium intact. Sinuses/Orbits: Globes and orbital soft tissues within normal limits. Paranasal sinuses and mastoid air cells are largely clear. Other: None. ASPECTS [facility] Stroke Program Early CT Score) - Ganglionic level infarction (caudate, lentiform nuclei, internal capsule, insula, M1-M3 cortex): 7 - Supraganglionic infarction (M4-M6 cortex): 3 Total score (0-10 with 10 being normal): 10 IMPRESSION: 1. No acute intracranial abnormality. 2. ASPECTS is 10. These results were communicated to Dr. Vanessa at 7:38 pm on 03/07/2024 by text page via the [facility] messaging system. Electronically Signed   By: Morene Hoard M.D.   On: 03/07/2024 19:40    Vitals:   03/08/24 0600 03/08/24 0658 03/08/24 0707 03/08/24 1125  BP: 117/81 117/89  108/86  Pulse: 82 (!) 25  97  Resp: 18 18 17    Temp: 98.6 F (37 C) 98.5 F (36.9 C) 98.1 F (36.7 C) 98.3 F (36.8 C)  TempSrc: Oral Oral Oral Oral  SpO2: 92% 97%   92%  Weight:      Height:         PHYSICAL EXAM General:  Alert, well-nourished, well-developed patient in no acute distress Psych:  Mood and affect appropriate for situation CV: A-fib on the monitor Respiratory:  Regular, unlabored respirations on room air  NEURO:  Mental Status: AA&Ox3, patient is able to give clear and coherent history Speech/Language: speech is without dysarthria or aphasia.  Naming, repetition, fluency, and comprehension intact.  Cranial Nerves:  II: PERRL. Visual fields full.  III, IV, VI: EOMI. Eyelids elevate symmetrically.  V: Sensation is intact to light touch and symmetrical to face.  VII: Face is symmetrical resting and smiling VIII: hearing intact to voice. IX, X: Phonation is normal.  KP:Dynloizm shrug 5/5. XII: tongue is midline without fasciculations. Motor: 5/5 strength to all muscle groups tested.  Tone: is normal and bulk is normal Sensation- Intact to light touch bilaterally.  Coordination: FTN intact bilaterally Gait- deferred  Most Recent NIH 0  ASSESSMENT/PLAN  Gordon Hayes is a 63 y.o. male with history of CAD, STEMI and COPD admitted for sudden onset right-sided weakness and aphasia accompanied by right sided headache.  Patient's symptoms resolved spontaneously, so he was not treated with TNK.  He was found to be in atrial fibrillation on the monitor and has been placed on IV heparin .  MRI reveals small strokes in the left frontal lobe.  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 ASPECTS 10.    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, moderate stenosis of right paraclinoid ICA, mild stenosis of right vertebral artery origin MRI few small foci of diffusion signal abnormality involving the left insular cortex and overlying left  frontal  lobe 2D Echo pending LDL 41 HgbA1c 5.8 VTE prophylaxis -fully anticoagulated with heparin  aspirin  81 mg daily prior to admission, now on heparin  IV, will switch to Eliquis tonight Therapy recommendations:  No follow up needed  Disposition: Pending   Atrial fibrillation with RVR, newly diagnosed History of CAD/STEMI Home Meds: Aspirin  81 Continue telemetry monitoring On IV heparin  right now, plan to switch to Eliquis tonight Off Cardizem IV Now on Cardizem p.o. Cardiology consulted Patient follows with Dr. Anner as outpatient  Hypertension Home meds: Carvedilol  6.25 mg twice daily Stable Now on Cardizem p.o. Long-term BP goal normotensive  Hyperlipidemia Home meds: Rosuvastatin  10 mg daily, resumed in hospital LDL 41, goal < 70 High intensity statin not indicated as LDL below goal Continue statin at discharge  Other Stroke Risk Factors ETOH use, alcohol level 32, advised to drink no more than 1-2 drink(s) a day  Other Active Problems COPD  Hospital day # 0  Patient seen by NP and then by MD, MD to edit note as needed. Cortney E Everitt Clint Kill , MSN, AGACNP-BC [facility] See Amion for schedule and pager information 03/08/2024 1:04 PM  ATTENDING NOTE: I reviewed above note and agree with the assessment and plan. Pt was seen and examined.   Wife and daughter are at bedside.  Patient doing well, stated that his speech back to normal and right arm no more weakness, essentially he is at baseline.  Neuroexam intact.  On heparin  IV for new diagnosed A-fib RVR, will switch likely tonight.  Cardizem switched to p.o. now.  Cardiology on board.  Continue home statin.  For detailed assessment and plan, please refer to above as I have made changes wherever appropriate.   Neurology will sign off. Please call with questions. Pt will follow up with stroke clinic NP at [facility] in about 4 weeks. Thanks for the consult.   Ary Cummins, MD PhD Stroke  Neurology 03/08/2024 6:08 PM      To contact Stroke Continuity provider, please refer to [email or web address]. After hours, contact General Neurology

## 2024-03-08 NOTE — TOC Transition Note (Signed)
 Transition of Care [facility] - Discharge Note   Patient Details  Name: Gordon Hayes MRN: [id number] Date of Birth: 1960/07/06  [facility] CM/SW Contact:  Andrez JULIANNA George, RN Phone Number: 03/08/2024, 11:35 AM   Clinical Narrative:     Pt will d/c home with self care. No follow up per PT.  No DME at home. Pt still works M-F during the daytime.  He drives self but wife can assist if needed.  Pt manages his own medications at home.  Wife will transport home. No CM needs.   Final next level of care: Home/Self Care Barriers to Discharge: No Barriers Identified   Patient Goals and CMS Choice            Discharge Placement                       Discharge Plan and Services Additional resources added to the After Visit Summary for                                       Social Drivers of Health (SDOH) Interventions SDOH Screenings   Food Insecurity: No Food Insecurity (03/08/2024)  Housing: Low Risk  (03/08/2024)  Transportation Needs: No Transportation Needs (03/08/2024)  Utilities: Not At Risk (03/08/2024)  Depression (PHQ2-9): Low Risk  (07/21/2023)  Tobacco Use: Medium Risk (03/07/2024)     Readmission Risk Interventions     No data to display

## 2024-03-08 NOTE — Progress Notes (Incomplete)
 ANTICOAGULATION CONSULT NOTE  Pharmacy Consult for Heparin  Indication: atrial fibrillation and stroke  Allergies  Allergen Reactions   Penicillins Anaphylaxis   Atorvastatin  Other (See Comments)    myalgias    Patient Measurements: Height: 5' 10 (177.8 cm) Weight: 85.2 kg (187 lb 13.3 oz) IBW/kg (Calculated) : 73 Heparin  Dosing Weight: 85.2 kg  Vital Signs: Temp: 98.1 F (36.7 C) (11/13 0707) Temp Source: Oral (11/13 0707) BP: 117/89 (11/13 0658) Pulse Rate: 25 (11/13 0658)  Labs: Recent Labs    03/07/24 1927 03/07/24 2006 03/08/24 0427  HGB 16.0 15.2 14.8  HCT 47.0 46.7 44.8  PLT  --  201 178  APTT  --  29  --   LABPROT  --  14.3  --   INR  --  1.1  --   HEPARINUNFRC  --   --  0.14*  CREATININE 1.00 0.96 0.92    Estimated Creatinine Clearance: 86 mL/min (by C-G formula based on SCr of 0.92 mg/dL).   Medical History: Past Medical History:  Diagnosis Date   Back pain, lumbosacral    CAD S/P percutaneous coronary angioplasty 03/2015   OM2 99>0% w/ 2.75 mm x 16 mm (3.2 mm) Synergy DES; inferior OM2 branch 80%, med rx, pRCA 70%, mRCA 90% (s/p staged PCI w/ 2. 75 X 33 mm Xience Alpine DES);    Carpal tunnel syndrome, bilateral    COPD (chronic obstructive pulmonary disease) (HCC) 2013   DJD (degenerative joint disease) of knee    STEMI (ST elevation myocardial infarction) (HCC)  03/31/2015   Subtotal OM2 - PCI with DES. EF 45-50%; Staged PCI of RCA   Assessment: 28 yom with a history of CAD, STEMI, COPD admitted with TIA vs CVA and new Afib RVR. No anticoagulation prior to admission. Pharmacy consulted for heparin .     Heparin  level *** is ***therapeutic on 1150 units/hr.  Goal of Therapy:  Heparin  level 0.3-0.5 units/ml Monitor platelets by anticoagulation protocol: Yes   Plan:  Heparin  infusion 1150 units/hr Monitor daily heparin  level, CBC, signs/symptoms of bleeding    Jinnie Door, PharmD, BCPS, BCCP Clinical Pharmacist  Please check AMION for  all [facility] phone numbers After 10:00 PM, call Main Pharmacy [phone]

## 2024-03-08 NOTE — Progress Notes (Signed)
 Echocardiogram 2D Echocardiogram has been performed.  Gordon Hayes 03/08/2024, 10:58 AM

## 2024-03-08 NOTE — Telephone Encounter (Signed)
 Pharmacy Patient Advocate Encounter  Insurance verification completed.    The patient is insured through [facility]. Patient has [employer], may use a copay card, and/or apply for patient assistance if available.    Ran test claim for Eliquis 5mg  and the current 30 day co-pay is $588.92 (due to deductible)   This test claim was processed through [facility]- copay amounts may vary at other pharmacies due to [employer], or as the patient moves through the different stages of their insurance plan.

## 2024-03-08 NOTE — Consult Note (Addendum)
 Cardiology Consultation   Patient ID: Gordon Hayes MRN: [id number]; DOB: [birth year]  Admit date: 03/07/2024 Date of Consult: 03/08/2024  PCP:  Chandra Toribio POUR, MD   [facility] Providers Cardiologist:  Alm Clay, MD       Patient Profile: Gordon Hayes is a 63 y.o. male with a hx of CAD s/p PCI with DES to RCA in 2016, COPD, chronic diastolic Heart failure with EF of 55 to 60%  who is being seen 03/08/2024 for the evaluation of afib with RVR at the request of Dr.Pahwani.  History of Present Illness: Gordon Hayes is a 63 y.o. male with a hx of CAD s/p PCI with DES to RCA in 2016, COPD, chronic diastolic Heart failure with EF of 55 to 60%  who is being seen 03/08/2024 for the evaluation of afib with RVR at the request of Dr.Pahwani.  Patient presented to the ED with symptoms of aphasia and right sided weakness that resolved. Code Stroke was called and CT head showed no acute abnormality. 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 MRI showed small foci of diffusion signal abnormality in the left insular cortex and left frontal lobe. Labs noted are A1c of 5.8 and LDL 41. BNP 477.9.   While in the ED patient went into afib with RVR. The atrial fibrillation is a new diagnosis for the patient. Patient required bolus of cardizem followed by cardizem drip. He was started on heparin . Patient then was stopped on drip and started on cardizem CD 180 mg.   Patient denied any chest pain, SOB, lower extremity edema, palpitations, PND, orthopnea. Patient states that his heart rates went into the 150s yesterday at its highest. Patient states that he was unaware of this until staff told him what was going on. Patient has remained asymptomatic throughout and states that he has never had palpitations before and has never been diagnosed with afib.    Past Medical History:   Diagnosis Date   Back pain, lumbosacral    CAD S/P percutaneous coronary angioplasty 03/2015   OM2 99>0% w/ 2.75 mm x 16 mm (3.2 mm) Synergy DES; inferior OM2 branch 80%, med rx, pRCA 70%, mRCA 90% (s/p staged PCI w/ 2. 75 X 33 mm Xience Alpine DES);    Carpal tunnel syndrome, bilateral    COPD (chronic obstructive pulmonary disease) (HCC) 2013   DJD (degenerative joint disease) of knee    STEMI (ST elevation myocardial infarction) (HCC)  03/31/2015   Subtotal OM2 - PCI with DES. EF 45-50%; Staged PCI of RCA    Past Surgical History:  Procedure Laterality Date   CARDIAC CATHETERIZATION N/A 03/31/2015   Procedure: Left Heart Cath and Coronary Angiography;  Surgeon: Alm LELON Clay, MD;  Location: [facility] INVASIVE CV LAB;  Service: Cardiovascular;  Subtotoal OM2, 85% RCA with diffuse upstream Dz. EF 45-50%   CARDIAC CATHETERIZATION N/A 03/31/2015   Procedure: Coronary Stent Intervention;  Surgeon: Alm LELON Clay, MD;  Location: [facility] INVASIVE CV LAB;: OM2 99% - Synergy DES 2.75 mm x 16 mm (3.2 mm).    CARDIAC CATHETERIZATION N/A 04/02/2015   Procedure: Coronary Stent Intervention;  Surgeon: Deatrice DELENA Cage, MD;  Location: [facility] INVASIVE CV LAB: STAGED PCI  of RCA  90% - Xience DES 2.75 mm x 33 mm   HERNIA REPAIR     right inguinal   POSTERIOR LAMINECTOMY / DECOMPRESSION LUMBAR SPINE  2007  TONSILLECTOMY     TRANSTHORACIC ECHOCARDIOGRAM  06/2015   EF 55-60%, mild LVH. Normal wall motion. Gr 1 DD.       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  I don't have my mind right to do it yet.  Substance and Sexual Activity   Alcohol use: Yes    Alcohol/week: 0.0 - 4.0 standard drinks of alcohol   Drug use: No   Sexual activity: Yes    Partners: Female  Other Topics Concern   Not on file  Social History Narrative   Denied application for additional life insurance policy due to recent diagnosis of COPD.   Social Drivers of Corporate Investment Banker Strain: Not on file  Food Insecurity: No Food Insecurity (03/08/2024)   Hunger Vital Sign    Worried About Running Out of Food in the Last Year: Never true    Ran Out of Food in the Last Year: Never true  Transportation Needs: No Transportation Needs (03/08/2024)   PRAPARE - Administrator, Civil Service (Medical): No    Lack of Transportation (Non-Medical): No  Physical Activity: Not on file  Stress: Not on file  Social Connections: Not on file  Intimate Partner Violence: Not At Risk (03/08/2024)   Humiliation, Afraid, Rape, and Kick questionnaire    Fear of Current or Ex-Partner: No    Emotionally Abused: No    Physically Abused: No    Sexually Abused: No    Family History:    Family History  Problem Relation Age of Onset   Hearing loss Mother    Hypertension Mother    Arthritis Mother    Cancer Father 52       prostate, bladder   Heart disease Father    Stroke Father    Heart attack Father        2   Cancer Maternal Grandfather    Cancer Paternal Grandmother    Heart disease Paternal  Grandfather    Colon cancer Neg Hx    Esophageal cancer Neg Hx    Rectal cancer Neg Hx    Stomach cancer Neg Hx      ROS:  Please see the history of present illness.  All other ROS reviewed and negative.     Physical Exam/Data: Vitals:   03/08/24 0658 03/08/24 0707 03/08/24 1125 03/08/24 1543  BP: 117/89  108/86 105/72  Pulse: (!) 25  97 61  Resp: 18 17  18   Temp: 98.5 F (36.9 C) 98.1 F (36.7 C) 98.3 F (36.8 C) 98.3 F (36.8 C)  TempSrc: Oral Oral Oral Oral  SpO2: 97%  92% 94%  Weight:      Height:        Intake/Output Summary (Last 24 hours) at 03/08/2024 1601 Last data filed at 03/08/2024 1527 Gross per 24 hour  Intake 403.45 ml  Output --  Net 403.45 ml      03/08/2024    5:00 AM 03/07/2024    7:28 PM 07/21/2023    8:11 AM  Last 3 Weights  Weight (lbs) 187 lb 13.3 oz 187 lb 13.3 oz 190 lb 12.8 oz  Weight (kg) 85.2 kg 85.2 kg 86.546 kg     Body mass index is 26.95 kg/m.  General:  Well nourished, well developed, in no acute distress Neck: no JVD Vascular: No carotid bruits; Distal pulses 2+ bilaterally Cardiac:  normal S1, S2; irregularly irregular; no murmur  Lungs:  clear to auscultation bilaterally, no wheezing, rhonchi or rales  Abd: soft, nontender, no hepatomegaly  Ext: no edema appreciated  Musculoskeletal:  No deformities, BUE and BLE strength normal and equal Skin: warm and dry  Neuro:  mild facial droop on right side noted but otherwise alert and oriented, able to move all extremities spontaneously  Psych:  Normal affect   EKG:  The EKG was personally reviewed and demonstrates:  atrial fibrillation with mild prolonged QT, unchanged from yesterday  Telemetry:  Telemetry was personally reviewed and demonstrates:  atrial fibrillation with rates in the low 100s, with nonsustained V. tach  Relevant CV Studies: LHC in 2016 showed severe 2 vessel disease in OM 2 and midRCA. 2nd Marginal lesion, 99% stenosed. PCI performed with DES with 0% residual  stenosis. Mid RCA lesion, 85 % stenosed and later intervented with DES to the mid and proximal RCA.. Lat 2nd Mrg lesion, 80% stenosed.  Echo in 2017 showed EF of 55 to 60% with no regional wall abnormalities and abnormal left ventricular relaxation( grade 1 diastolic dysfunction). MR and TR noted   Laboratory Data: High Sensitivity Troponin:  No results for input(s): TROPONINIHS in the last 720 hours.   Chemistry Recent Labs  Lab 03/07/24 1927 03/07/24 2006 03/08/24 0427  NA 136 136 137  K 4.0 4.0 4.0  CL 103 104 106  CO2  --  20* 20*  GLUCOSE 76 80 85  BUN 11 11 9   CREATININE 1.00 0.96 0.92  CALCIUM   --  8.6* 8.6*  MG  --  2.0 2.0  GFRNONAA  --  >60 >60  ANIONGAP  --  12 11    Recent Labs  Lab 03/07/24 2006 03/08/24 0427  PROT 6.3* 6.0*  ALBUMIN 3.6 3.3*  AST 24 19  ALT 32 27  ALKPHOS 56 54  BILITOT 0.7 1.0   Lipids  Recent Labs  Lab 03/08/24 0427  CHOL 100  TRIG 29  HDL 53  LDLCALC 41  CHOLHDL 1.9    Hematology Recent Labs  Lab 03/07/24 1927 03/07/24 2006 03/08/24 0427  WBC  --  7.1 6.2  RBC  --  4.95 4.89  HGB 16.0 15.2 14.8  HCT 47.0 46.7 44.8  MCV  --  94.3 91.6  MCH  --  30.7 30.3  MCHC  --  32.5 33.0  RDW  --  13.6 13.8  PLT  --  201 178   Thyroid  Recent Labs  Lab 03/07/24 2308  TSH 1.776    BNP Recent Labs  Lab 03/07/24 2010  BNP 477.9*    DDimer No results for input(s): DDIMER in the last 168 hours.  Radiology/Studies:  ECHOCARDIOGRAM COMPLETE BUBBLE STUDY Result Date: 03/08/2024    ECHOCARDIOGRAM REPORT   Patient Name:   Gordon Hayes Date of Exam: 03/08/2024 Medical Rec #:  [id number]       Height:       70.0 in Accession #:    [id number]      Weight:       187.8 lb Date of Birth:  10-Nov-1960       BSA:          2.032 m Patient Age:    63 years        BP:           115/80 mmHg Patient Gender: M               HR:           95 bpm. Exam Location:  Inpatient Procedure: 2D Echo, Cardiac Doppler, Color Doppler and Saline  Contrast Bubble            Study (Both Spectral and Color Flow Doppler were utilized during            procedure). Indications:    TIA  History:        Patient has prior history of Echocardiogram examinations, most                 recent 07/14/2015. CAD and Previous Myocardial Infarction, TIA                 and COPD, Arrythmias:Atrial Fibrillation; Risk                 Factors:Hypertension, Dyslipidemia and Former Smoker.  Sonographer:    Juliene Rucks Referring Phys: [id number] EVA KATHEE PORE  Sonographer Comments: Image acquisition challenging due to COPD. IMPRESSIONS  1. Left ventricular ejection fraction, by estimation, is 40 to 45%. The left ventricle has mildly decreased function. The left ventricle demonstrates global hypokinesis. Left ventricular diastolic function could not be evaluated.  2. Right ventricular systolic function is normal. The right ventricular size is normal. Tricuspid regurgitation signal is inadequate for assessing PA pressure.  3. The mitral valve is degenerative. Mild to moderate mitral valve regurgitation. No evidence of mitral stenosis. Moderate to severe mitral annular calcification.  4. The aortic valve is normal in structure. Aortic valve regurgitation is not visualized. No aortic stenosis is present.  5. The inferior vena cava is dilated in size with <50% respiratory variability, suggesting right atrial pressure of 15 mmHg.  6. Agitated saline contrast bubble study was negative, with no evidence of any interatrial shunt. FINDINGS  Left Ventricle: Left ventricular ejection fraction, by estimation, is 40 to 45%. The left ventricle has mildly decreased function. The left ventricle demonstrates global hypokinesis. The left ventricular internal cavity size was normal in size. There is  no left ventricular hypertrophy. Left ventricular diastolic function could not be evaluated due to mitral annular calcification (moderate or greater). Left ventricular diastolic function could not be  evaluated. Right Ventricle: The right ventricular size is normal. No increase in right ventricular wall thickness. Right ventricular systolic function is normal. Tricuspid regurgitation signal is inadequate for assessing PA pressure. Left Atrium: Left atrial size was normal in size. Right Atrium: Right atrial size was normal in size. Pericardium: There is no evidence  of pericardial effusion. Presence of epicardial fat layer. Mitral Valve: The mitral valve is degenerative in appearance. Moderate to severe mitral annular calcification. Mild to moderate mitral valve regurgitation. No evidence of mitral valve stenosis. Tricuspid Valve: The tricuspid valve is normal in structure. No evidence of tricuspid stenosis. Aortic Valve: The aortic valve is normal in structure. Aortic valve regurgitation is not visualized. No aortic stenosis is present. Pulmonic Valve: The pulmonic valve was normal in structure. Pulmonic valve regurgitation is not visualized. No evidence of pulmonic stenosis. Aorta: The aortic root is normal in size and structure. Venous: The inferior vena cava is dilated in size with less than 50% respiratory variability, suggesting right atrial pressure of 15 mmHg. IAS/Shunts: No atrial level shunt detected by color flow Doppler. Agitated saline contrast was given intravenously to evaluate for intracardiac shunting. Agitated saline contrast bubble study was negative, with no evidence of any interatrial shunt.  LEFT VENTRICLE PLAX 2D LVIDd:         5.50 cm   Diastology LVIDs:         4.50 cm   LV e' medial:  5.98 cm/s LV PW:         1.00 cm   LV e' lateral: 7.83 cm/s LV IVS:        0.90 cm LVOT diam:     2.20 cm LV SV:         58 LV SV Index:   28 LVOT Area:     3.80 cm  RIGHT VENTRICLE             IVC RV Basal diam:  3.50 cm     IVC diam: 2.20 cm RV Mid diam:    3.30 cm RV S prime:     13.50 cm/s TAPSE (M-mode): 1.3 cm LEFT ATRIUM             Index        RIGHT ATRIUM           Index LA diam:        4.70 cm  2.31 cm/m   RA Area:     19.70 cm LA Vol (A2C):   76.0 ml 37.39 ml/m  RA Volume:   48.00 ml  23.62 ml/m LA Vol (A4C):   55.3 ml 27.21 ml/m LA Biplane Vol: 67.3 ml 33.11 ml/m  AORTIC VALVE LVOT Vmax:   94.40 cm/s LVOT Vmean:  65.700 cm/s LVOT VTI:    0.152 m  AORTA Ao Root diam: 3.20 cm  SHUNTS Systemic VTI:  0.15 m Systemic Diam: 2.20 cm Dub Tobb DO Electronically signed by Dub Huntsman DO Signature Date/Time: 03/08/2024/3:15:53 PM    Final    DG Chest Port 1 View Result Date: 03/07/2024 EXAM: 1 VIEW(S) XRAY OF THE CHEST 03/07/2024 10:42:00 PM COMPARISON: 04/01/2015 CLINICAL HISTORY: Atrial fibrillation (HCC) FINDINGS: LUNGS AND PLEURA: No focal pulmonary opacity. No pleural effusion. No pneumothorax. HEART AND MEDIASTINUM: No acute abnormality of the cardiac and mediastinal silhouettes. BONES AND SOFT TISSUES: No acute osseous abnormality. IMPRESSION: 1. No acute cardiopulmonary process. Electronically signed by: Pinkie Pebbles MD 03/07/2024 10:46 PM EST RP Workstation: [id number]   MR BRAIN WO CONTRAST Result Date: 03/07/2024 CLINICAL DATA:  Initial evaluation for acute neuro deficit, stroke suspected. EXAM: MRI HEAD WITHOUT CONTRAST TECHNIQUE: Multiplanar, multiecho pulse sequences of the brain and surrounding structures were obtained without intravenous contrast. COMPARISON:  Comparison made with CTs from earlier the same day. FINDINGS: Brain: Cerebral volume within normal limits for age. Patchy  T2/FLAIR hyperintensity involving the periventricular and deep white matter both cerebral hemispheres, consistent chronic small vessel ischemic disease, mild for age. Question subtle foci of diffusion signal abnormality involving the left insular cortex and overlying left frontal lobe (series 5, images 78, 82). Findings are extremely subtle in nature, and could potentially be artifactual, although an underlying subtle acute left MCA distribution infarct could also be considered in the correct clinical  setting. No associated mass effect. Small punctate chronic microhemorrhage noted within this region without definite associated w hemorrhage (series 12, image 36). No other evidence for acute or subacute ischemia. Gray-white matter differentiation otherwise maintained. No other acute or chronic intracranial blood products. No mass lesion, midline shift or mass effect. No hydrocephalus or extra-axial fluid collection. Pituitary gland within normal limits. Vascular: Major intracranial vascular flow voids are maintained. Skull and upper cervical spine: Craniocervical junction within normal limits. Bone marrow signal intensity normal. No scalp soft tissue abnormality. Sinuses/Orbits: Globes and orbital soft tissues within normal limits. Paranasal sinuses are largely clear. No significant mastoid effusion. Other: None. IMPRESSION: 1. Question few small foci of diffusion signal abnormality involving the left insular cortex and overlying left frontal lobe. Findings are extremely subtle in nature, and could potentially be artifactual, although an underlying subtle acute nonhemorrhagic left MCA distribution infarct could also be considered in the correct clinical setting. 2. No other acute intracranial abnormality. 3. Underlying mild chronic microvascular ischemic disease. Electronically Signed   By: Morene Hoard M.D.   On: 03/07/2024 22:01   CT ANGIO HEAD NECK W WO CM (CODE STROKE) Result Date: 03/07/2024 EXAM: CTA HEAD AND NECK WITH AND WITHOUT 03/07/2024 07:35:33 PM TECHNIQUE: CTA of the head and neck was performed with and without the administration of 60 mL of iohexol  (OMNIPAQUE ) 350 MG/ML injection. Multiplanar 2D and/or 3D reformatted images are provided for review. Automated exposure control, iterative reconstruction, and/or weight based adjustment of the mA/kV was utilized to reduce the radiation dose to as low as reasonably achievable. Stenosis of the internal carotid arteries measured using NASCET  criteria. COMPARISON: Same day CT head CLINICAL HISTORY: Neuro deficit, acute, stroke suspected. FINDINGS: CTA NECK: AORTIC ARCH AND ARCH VESSELS: Mild scattered atherosclerosis of the visualized aortic arch. Atherosclerosis of the proximal right subclavian artery without stenosis. No dissection or arterial injury. No significant stenosis of the brachiocephalic artery. CERVICAL CAROTID ARTERIES: Right carotid artery is patent from the origin to the skull base. Atherosclerosis of the right carotid bifurcation without stenosis. Additional atherosclerosis along the proximal right cervical ICA resulting in approximately 50% stenosis. Left carotid artery is patent from the origin to the skull base. Mild atherosclerosis of the left common carotid artery. Atherosclerosis at the left carotid bifurcation without hemodynamically significant stenosis. No dissection or arterial injury. CERVICAL VERTEBRAL ARTERIES: Vertebral arteries are patent from the origins to the vertebrobasilar confluence. Left vertebral artery is dominant. Atherosclerosis of the right vertebral artery origin resulting in mild stenosis. There is tapering of the post PICA segment of the nondominant right vertebral artery. Atherosclerosis of the left V4 segment resulting in mild stenosis. No dissection or arterial injury. LUNGS AND MEDIASTINUM: Paraseptal and centrilobular emphysema in the upper lobes. SOFT TISSUES: Edentulous maxilla. No acute abnormality. BONES: Degenerative changes in the visualized spine. No acute abnormality. CTA HEAD: ANTERIOR CIRCULATION: Intracranial internal carotid arteries are patent bilaterally. Atherosclerosis of the bilateral carotid siphons. Atherosclerosis along the right periclinoid ICA resulting in moderate stenosis. Middle cerebral arteries are patent bilaterally. Anterior cerebral arteries are patent bilaterally. No  aneurysm. POSTERIOR CIRCULATION: Basilar artery is patent and is primarily supplied via the left vertebral  artery. There is distal tapering of the basilar artery. Significant hypoplasia of the basilar artery distal to the origins of the superior cerebellar arteries. Superior cerebellar arteries are patent bilaterally. Posterior communicating arteries are visualized bilaterally. Posterior cerebral arteries are supplied by the posterior communicating arteries. No significant stenosis of the vertebral arteries. No aneurysm. OTHER: No dural venous sinus thrombosis on this non-dedicated study. IMPRESSION: 1. No large vessel occlusion or aneurysm in the head or neck. 2. 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 3. Approximately 50% stenosis of the proximal right cervical ICA. 4. Moderate stenosis of the right paraclinoid ICA. 5. Mild stenosis of the right vertebral artery origin. 6. Paraseptal and centrilobular emphysema in the upper lobes. Consider evaluation for low-dose CT lung cancer screening program. Electronically signed by: Donnice Mania MD 03/07/2024 07:58 PM EST RP Workstation: [id number]   CT HEAD CODE STROKE WO CONTRAST Result Date: 03/07/2024 CLINICAL DATA:  Code stroke. Initial evaluation for acute neuro deficit, stroke suspected. EXAM: CT HEAD WITHOUT CONTRAST TECHNIQUE: Contiguous axial images were obtained from the base of the skull through the vertex without intravenous contrast. RADIATION DOSE REDUCTION: This exam was performed according to the departmental dose-optimization program which includes automated exposure control, adjustment of the mA and/or kV according to patient size and/or use of iterative reconstruction technique. COMPARISON:  None Available. FINDINGS: Brain: Cerebral volume within normal limits. No acute intracranial hemorrhage. No acute large vessel territory infarct. No mass lesion, midline shift or mass effect. No hydrocephalus or extra-axial fluid collection. Vascular: No abnormal hyperdense vessel.  Scattered vascular calcifications noted within the carotid siphons. Skull: Scalp soft tissues demonstrate no acute finding. Calvarium intact. Sinuses/Orbits: Globes and orbital soft tissues within normal limits. Paranasal sinuses and mastoid air cells are largely clear. Other: None. ASPECTS [facility] Stroke Program Early CT Score) - Ganglionic level infarction (caudate, lentiform nuclei, internal capsule, insula, M1-M3 cortex): 7 - Supraganglionic infarction (M4-M6 cortex): 3 Total score (0-10 with 10 being normal): 10 IMPRESSION: 1. No acute intracranial abnormality. 2. ASPECTS is 10. These results were communicated to Dr. Vanessa at 7:38 pm on 03/07/2024 by text page via the [facility] messaging system. Electronically Signed   By: Morene Hoard M.D.   On: 03/07/2024 19:40     Assessment and Plan: Afib with RVR Patient was started on diltiazem drip and then transitioned to diltiazem CD 180 mg. Stroke thought to likely be in embolic from afib  Stop diltiazem and start bisoprolol for rate control effect and for HFrEF Continue apixaban 5 mg BID May benefit from chemical cardioversion in the future but not in the setting of acute stroke  2. CHF with EF of 55 to 60% New echo with bubble study done today shows EF of 40 to 45% with mild decreased LV function. LV shows global hypokinesis. Mild to moderate mitral valve regurgitation with moderate to severe mitral annular valve calcification. IVC dilated in size. No evidence of interatrial shunt  -Will start bisoprolol tomorrow for rate control and in the setting of new HFrEF  3.HTN  Blood pressure has been normotensive.  Outside of permissive hypertension window  4. Acute stroke  Thought to be embolic in nature from A-fib Per neurology and primary    Risk Assessment/Risk Scores:     [place]  Heart Association (NYHA) Functional Class NYHA Class I  CHA2DS2-VASc Score =  CHADSVASC score of  4. This indicates a 6.7 % annual risk of  stroke. The patient's score is based upon:HTN, CHF, stroke/tia history           For questions or updates, please contact [facility] Please consult [email or web address] for contact info under    Signed, Rosalyn D'Mello, DO  03/08/2024 4:01 PM   I have personally evaluated and examined the patient. The history, physical exam, and medical decision making documented below were performed independently and substantively by me. I have reviewed all relevant data, formulated the assessment and plan, and assumed responsibility for the management of this patient. My documentation reflects the substantive portion of the split/shared visit, in accordance with CMS and CPT guidelines. I have updated the Intern documentation above as appropriate.  I have personally performed the substantive portion of the medical decision making, including interpretation of diagnostic data, formulation of the management plan, and assessment of risks. In summation:  Gordon Hayes is 64 year old with coronary artery disease and heart failure who presents with a new diagnosis of atrial fibrillation and recent stroke.  He has a history of coronary artery disease, previously managed with PCI in the circumflex territory, and heart failure with a reduced ejection fraction of 45% to 50%.  He experienced a sudden onset stroke in the setting of atrial fibrillation with a rapid ventricular response. Initially, he was placed on heparin  and transitioned to other anticoagulation.  He is in atrial fibrillation with a controlled rate after receiving diltiazem extended release earlier today. No chest pain is present, but he experienced a slight headache behind his eye and difficulty speaking for about twenty minutes during the stroke event.  He has a history of alcohol use, consuming about two beers a day, but does not drink heavily. He has a family history of heart attacks, which he describes as running 'hot and heavy' in the  family.  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 He quit smoking a long time ago but occasionally smokes one or two cigarettes.  Exam notable for  Gen: no distress   Neck: No JVD Cardiac: No Rubs or Gallops, systolic murmur, IRIR Respiratory: Clear to auscultation bilaterally, normal effort, normal  respiratory rate GI: Soft, nontender, non-distended  MS: No  edema;  moves all extremities Integument: Skin feels warm Neuro:  At time of evaluation, alert and oriented to person/place/time/situation  Psych: Normal affect, patient feels ok  Personally reviewed data and interpretation:   Tele: AF RVR last rates 88, one run of NSVT Additional tests:  In assessment and plan:   Atrial fibrillation with rapid ventricular response and embolic stroke Newly diagnosed atrial fibrillation with rapid ventricular response, currently in controlled rate. Recent embolic stroke likely related to atrial fibrillation. Neurology managing antiplatelet and anticoagulation therapy. Risks of cardioversion discussed, including potential for stroke if not on anticoagulation. Current management focuses on rate control and stroke prevention. Discussed potential for electrical or chemical cardioversion, emphasizing the need for anticoagulation to prevent stroke risk. - Continue anticoagulation therapy as managed by neurology. - Switched from diltiazem to bisoprolol - Plan for potential cardioversion if necessary, ensuring anticoagulation is maintained.  Heart failure with mildly reduced ejection fraction - acute on chronic euvolemic, NYHA I,  - Newly diagnosed heart failure with mildly reduced ejection fraction (EF 40-45%). Current management includes rate control and heart function improvement.  Diltiazem not optimal due to reduced ejection fraction. - Switched from diltiazem to a medication that  improves heart strength and controls rate. - outpatient DCCV, ARB (BP) and potentially MRA  Coronary artery disease status post PCI (circumflex) Coronary artery disease with previous PCI in the circumflex territory. No recent heart catheterization. No evidence of ischemia on lab testing or EKG. - antiplatelet and AC as per neurology; do not want to be aggressive with GDMT and cause hypotension given his current BP and stroke  COPD - bisoprolol chosen not home coreg  for this reason  Suspected obstructive sleep apnea - Will plan for obstructive sleep apnea testing before the end of 2025.  Discussed at length with his wife  Stanly Leavens, MD FASE [facility] Cardiologist [facility]  [address] [place], [address] [place], [place] [address] [phone]  6:12 PM

## 2024-03-08 NOTE — Evaluation (Signed)
 Physical Therapy Evaluation Patient Details Name: Gordon Hayes MRN: [id number] DOB: [birth year] Today's Date: 03/08/2024  History of Present Illness  Pt is a 63 y/o M who presented to [facility] on 03/07/24 with R arm weakness, R sided-headache, R visual changes, and difficulty speaking. MRI brain revealed: Question few small foci of diffusion signal abnormality involving the left insular cortex and overlying left frontal lobe. PMHx: COPD, HLD, HTN, CAD.   Clinical Impression  PTA pt was independent with no AD. Pt presents at functional mobility baseline with ability to ambulate 566ft independently with no AD. Pt was also able to negotiate 4 steps with no handrails and ModI. Pt was able to accept challenges to balance including cognitive task of counting backwards by 3. Pt reported no residual deficits and feels comfortable discharging home whenever medically stable. No further acute or post-acute PT needs with acute PT signing off.   87-112 BPM during session        If plan is discharge home, recommend the following: Assist for transportation   Can travel by private vehicle    Yes    Equipment Recommendations None recommended by PT     Functional Status Assessment Patient has not had a recent decline in their functional status     Precautions / Restrictions Restrictions Weight Bearing Restrictions Per Provider Order: No      Mobility  Bed Mobility Overal bed mobility: Independent     Transfers Overall transfer level: Independent Equipment used: None     Ambulation/Gait Ambulation/Gait assistance: Independent Gait Distance (Feet): 500 Feet Assistive device: None Gait Pattern/deviations: WFL(Within Functional Limits)  General Gait Details: steady with no AD, able to accept challenges to balance  Stairs Stairs: Yes Stairs assistance: Modified independent (Device/Increase time) Stair Management: No rails, Forwards Number of Stairs: 4 General stair comments: steady with  no UE support    Modified Rankin (Stroke Patients Only) Modified Rankin (Stroke Patients Only) Pre-Morbid Rankin Score: No symptoms Modified Rankin: No symptoms     Balance Overall balance assessment: Independent        Pertinent Vitals/Pain Pain Assessment Pain Assessment: No/denies pain    Home Living Family/patient expects to be discharged to:: Private residence Living Arrangements: Spouse/significant other Available Help at Discharge: Family;Available 24 hours/day Type of Home: House Home Access: Stairs to enter Entrance Stairs-Rails: Right;Left;Can reach both Entrance Stairs-Number of Steps: 5 Alternate Level Stairs-Number of Steps: 12 Home Layout: Two level Home Equipment: Tub bench      Prior Function Prior Level of Function : Independent/Modified Independent;Driving;Working/employed  Mobility Comments: Ind with no AD       Extremity/Trunk Assessment   Upper Extremity Assessment Upper Extremity Assessment: Defer to OT evaluation    Lower Extremity Assessment Lower Extremity Assessment: Overall WFL for tasks assessed    Cervical / Trunk Assessment Cervical / Trunk Assessment: Normal  Communication   Communication Communication: No apparent difficulties    Cognition Arousal: Alert Behavior During Therapy: WFL for tasks assessed/performed   PT - Cognitive impairments: No apparent impairments    Following commands: Intact       Cueing Cueing Techniques: Verbal cues      PT Assessment Patient does not need any further PT services         PT Goals (Current goals can be found in the Care Plan section)  Acute Rehab PT Goals PT Goal Formulation: All assessment and education complete, DC therapy     AM-PAC PT 6 Clicks Mobility  Outcome Measure Help needed  turning from your back to your side while in a flat bed without using bedrails?: None Help needed moving from lying on your back to sitting on the side of a flat bed without using  bedrails?: None Help needed moving to and from a bed to a chair (including a wheelchair)?: None Help needed standing up from a chair using your arms (e.g., wheelchair or bedside chair)?: None Help needed to walk in hospital room?: None Help needed climbing 3-5 steps with a railing? : None 6 Click Score: 24    End of Session   Activity Tolerance: Patient tolerated treatment well Patient left: in chair;with call bell/phone within reach Nurse Communication: Mobility status PT Visit Diagnosis: Other abnormalities of gait and mobility (R26.89)    Time: 9242-9185 PT Time Calculation (min) (ACUTE ONLY): 17 min   Charges:   PT Evaluation $PT Eval Low Complexity: 1 Low   PT General Charges $$ ACUTE PT VISIT: 1 Visit        Kate ORN, PT, DPT Secure Chat Preferred  Rehab Office [phone]   Kate BRAVO Wendolyn 03/08/2024, 10:19 AM

## 2024-03-08 NOTE — Plan of Care (Signed)
  Problem: Ischemic Stroke/TIA Tissue Perfusion: Goal: Complications of ischemic stroke/TIA will be minimized Outcome: Progressing   Problem: Health Behavior/Discharge Planning: Goal: Ability to manage health-related needs will improve Outcome: Progressing     Problem: Coping: Goal: Level of anxiety will decrease Outcome: Progressing   Problem: Safety: Goal: Ability to remain free from injury will improve Outcome: Progressing   Problem: Skin Integrity: Goal: Risk for impaired skin integrity will decrease Outcome: Progressing

## 2024-03-08 NOTE — TOC CAGE-AID Note (Signed)
 Transition of Care [facility]) - CAGE-AID Screening   Patient Details  Name: CEDRIK HEINDL MRN: [id number] Date of Birth: May 15, 1960  Transition of Care [facility]) CM/SW Contact:    Andrez JULIANNA George, RN Phone Number: 03/08/2024, 11:33 AM   Clinical Narrative:  Pt acknowledges occasional alcohol use. He denies the need for inpatient/ outpatient counseling resources.  CAGE-AID Screening:

## 2024-03-08 NOTE — Progress Notes (Signed)
 Received from ED, alert and oriented X 4, ambulated independently, no complain of any pain, follows command. Hooked on tele monitoring, place call bell within reached.

## 2024-03-08 NOTE — Evaluation (Signed)
 Occupational Therapy Evaluation Patient Details Name: Gordon Hayes MRN: [id number] DOB: 07-13-1960 Today's Date: 03/08/2024   History of Present Illness   Pt is a 63 y/o M who presented to [facility] on 03/07/24 with R arm weakness, R sided-headache, R visual changes, and difficulty speaking. MRI brain revealed: Question few small foci of diffusion signal abnormality involving the left insular cortex and overlying left frontal lobe. PMHx: COPD, HLD, HTN, CAD.     Clinical Impressions Pt greeted in supine eating lunch independently, agreeable for OT session. He presents today near his baseline, indep with all UB/LB ADLs and ambulation without AD. Simulated IADL tasks such as grocery shopping without LOB/difficulty. Pt in a-fib during session, remains asymptomatic. Highest HR ~110s. Pt endorses symptoms have resolved.  Given pt is near his baseline, no further acute OT needs at this time. Will sign-off.     If plan is discharge home, recommend the following:    (return home independently)     Functional Status Assessment         Equipment Recommendations   None recommended by OT     Recommendations for Other Services         Precautions/Restrictions   Precautions Precautions: None Restrictions Weight Bearing Restrictions Per Provider Order: No     Mobility Bed Mobility Overal bed mobility: Independent                  Transfers Overall transfer level: Independent Equipment used: None                      Balance Overall balance assessment: Independent                                         ADL either performed or assessed with clinical judgement   ADL Overall ADL's : At baseline                                       General ADL Comments: eating indep, simulated several UB/LB ADLs without difficulty     Vision Baseline Vision/History: 1 Wears glasses Ability to See in Adequate Light: 0  Adequate Patient Visual Report: No change from baseline Vision Assessment?: Wears glasses for reading (cheap readers)     Perception         Praxis         Pertinent Vitals/Pain Pain Assessment Pain Assessment: No/denies pain     Extremity/Trunk Assessment Upper Extremity Assessment Upper Extremity Assessment: Overall WFL for tasks assessed   Lower Extremity Assessment Lower Extremity Assessment: Overall WFL for tasks assessed   Cervical / Trunk Assessment Cervical / Trunk Assessment: Normal   Communication Communication Communication: No apparent difficulties   Cognition Arousal: Alert Behavior During Therapy: WFL for tasks assessed/performed Cognition: No apparent impairments             OT - Cognition Comments: very participatory & pleasant                 Following commands: Intact       Cueing  General Comments   Cueing Techniques: Verbal cues;Visual cues  supportive family at bedside throughout   Exercises     Shoulder Instructions      Home Living Family/patient expects to be discharged to:: Private residence  Living Arrangements: Spouse/significant other Available Help at Discharge: Family;Available 24 hours/day Type of Home: House Home Access: Stairs to enter [employer] of Steps: 5 Entrance Stairs-Rails: Right;Left;Can reach both Home Layout: Two level Alternate Level Stairs-Number of Steps: 12 Alternate Level Stairs-Rails: Left Bathroom Shower/Tub: Chief Strategy Officer: Standard     Home Equipment: Tub bench          Prior Functioning/Environment Prior Level of Function : Independent/Modified Independent;Driving;Working/employed             Mobility Comments: Ind with no AD ADLs Comments: indep, + driving, 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    OT Problem List:     OT Treatment/Interventions:        OT Goals(Current goals can be  found in the care plan section)   Acute Rehab OT Goals Patient Stated Goal: go home   OT Frequency:       Co-evaluation              AM-PAC OT 6 Clicks Daily Activity     Outcome Measure Help from another person eating meals?: None Help from another person taking care of personal grooming?: None Help from another person toileting, which includes using toliet, bedpan, or urinal?: None Help from another person bathing (including washing, rinsing, drying)?: None Help from another person to put on and taking off regular upper body clothing?: None Help from another person to put on and taking off regular lower body clothing?: None 6 Click Score: 24   End of Session    Activity Tolerance: Patient tolerated treatment well Patient left: in bed;with call bell/phone within reach;with family/visitor present  OT Visit Diagnosis: Other abnormalities of gait and mobility (R26.89)                Time: 8775-8757 OT Time Calculation (min): 18 min Charges:  OT General Charges $OT Visit: 1 Visit OT Evaluation $OT Eval Low Complexity: 1 Low  Jeweline Reif M. [place], OTR/L [facility] Acute Rehabilitation Services [phone] Secure Chat Preferred  Tzirel Leonor 03/08/2024, 4:26 PM

## 2024-03-08 NOTE — Progress Notes (Signed)
 PROGRESS NOTE    Gordon Hayes  FMW:[id number] DOB: 1960-12-19 DOA: 03/07/2024 PCP: Chandra Toribio POUR, MD   Brief Narrative:  Gordon Hayes is a 63 y.o. male with medical history significant for coronary artery disease status post PCI with drug-eluting stent to RCA in December 2016, COPD, essential hypertension, chronic diastolic heart failure, who is admitted to [facility] on 03/07/2024 with strokelike symptoms after presenting from home to [facility] ED complaining of aphasia.   Assessment & Plan:   Principal Problem:   Stroke-like symptoms Active Problems:   Atrial fibrillation with rapid ventricular response (HCC)   History of COPD   History of essential hypertension   Chronic diastolic CHF (congestive heart failure) (HCC)   Acute ischemic stroke/likely embolic, POA: Symptoms of acute onset of expressive aphasia, right upper extremity weakness, and right-sided vision changes started around 1900 on 03/07/24, which have all subsequently improved to baseline upon arrival to ED.  MRI confirms few small foci of diffusion signal abnormality involving left insular cortex and overlying left frontal lobe. CTA negative for LVO.  Hemoglobin A1c 5.8, LDL 41.  Echo pending.  PT OT consulted.  Patient was initially loaded with aspirin  and Plavix but then developed new onset atrial fibrillation and started on heparin  drip.  Neurology following, management per them.   New onset atrial fibrillation with RVR: Required bolus of Cardizem followed by Cardizem drip.  On heparin .  Started him on Cardizem CD 180 mg and stopped Cardizem.  Rates are hovering between 100-110 with rest.  My plan was to discharge home but these rates at rest are high, I have requested cardiology consult.  Waiting for neurology, likely plan to transition to Eliquis.   COPD: Documented history thereof, without clinical evidence of acute exacerbation at this time.  Continue bronchodilators.    Essential Hypertension: documented  h/o such, with outpatient antihypertensive regimen including Coreg  which is on hold to allow permissive hypertension, blood pressure on the low side.   Chronic diastolic heart failure: most recent echocardiogram performed March 2017, which showed LVEF 55 to 60%, grade 1 diastolic dysfunction . Appears euvolemic.  Not on any diuretics.  DVT prophylaxis: SCDs Start: 03/07/24 2117   Code Status: Full Code  Family Communication: Wife present at bedside.  Plan of care discussed with patient in length and he/she verbalized understanding and agreed with it.  Status is: Observation The patient will require care spanning > 2 midnights and should be moved to inpatient because: A-fib with elevated rates, needs cardiology evaluation as well as neurology evaluation.   Estimated body mass index is 26.95 kg/m as calculated from the following:   Height as of this encounter: 5' 10 (1.778 m).   Weight as of this encounter: 85.2 kg.    Nutritional Assessment: Body mass index is 26.95 kg/m.SABRA Seen by dietician.  I agree with the assessment and plan as outlined below: Nutrition Status:        . Skin Assessment: I have examined the patient's skin and I agree with the wound assessment as performed by the wound care RN as outlined below:    Consultants:  Neurology and cardiology  Procedures:  None  Antimicrobials:  Anti-infectives (From admission, onward)    None         Subjective: Patient seen and examined earlier, wife at the bedside.  Patient was feeling well.  No resumption of the neurosymptoms.  Although he was in atrial fibrillation but denied any chest pain, palpitation, dizziness.  Objective:  Vitals:   03/08/24 0505 03/08/24 0600 03/08/24 0658 03/08/24 0707  BP: 90/69 117/81 117/89   Pulse: 99 82 (!) 25   Resp: 18 18 18 17   Temp: 98.6 F (37 C) 98.6 F (37 C) 98.5 F (36.9 C) 98.1 F (36.7 C)  TempSrc: Oral Oral Oral Oral  SpO2: 92% 92% 97%   Weight:      Height:         Intake/Output Summary (Last 24 hours) at 03/08/2024 0800 Last data filed at 03/07/2024 2230 Gross per 24 hour  Intake 60 ml  Output --  Net 60 ml   Filed Weights   03/07/24 1928 03/08/24 0500  Weight: 85.2 kg 85.2 kg    Examination:  General exam: Appears calm and comfortable  Respiratory system: Clear to auscultation. Respiratory effort normal. Cardiovascular system: S1 & S2 heard, irregularly irregular rate and rhythm RR. No JVD, murmurs, rubs, gallops or clicks. No pedal edema. Gastrointestinal system: Abdomen is nondistended, soft and nontender. No organomegaly or masses felt. Normal bowel sounds heard. Central nervous system: Alert and oriented. No focal neurological deficits.  Right facial droop Extremities: Symmetric 5 x 5 power. Skin: No rashes, lesions or ulcers Psychiatry: Judgement and insight appear normal. Mood & affect appropriate.    Data Reviewed: I have personally reviewed following labs and imaging studies  CBC: Recent Labs  Lab 03/07/24 1927 03/07/24 2006 03/08/24 0427  WBC  --  7.1 6.2  NEUTROABS  --  4.1 3.9  HGB 16.0 15.2 14.8  HCT 47.0 46.7 44.8  MCV  --  94.3 91.6  PLT  --  201 178   Basic Metabolic Panel: Recent Labs  Lab 03/07/24 1927 03/07/24 2006 03/08/24 0427  NA 136 136 137  K 4.0 4.0 4.0  CL 103 104 106  CO2  --  20* 20*  GLUCOSE 76 80 85  BUN 11 11 9   CREATININE 1.00 0.96 0.92  CALCIUM   --  8.6* 8.6*  MG  --  2.0 2.0   GFR: Estimated Creatinine Clearance: 86 mL/min (by C-G formula based on SCr of 0.92 mg/dL). Liver Function Tests: Recent Labs  Lab 03/07/24 2006 03/08/24 0427  AST 24 19  ALT 32 27  ALKPHOS 56 54  BILITOT 0.7 1.0  PROT 6.3* 6.0*  ALBUMIN 3.6 3.3*   No results for input(s): LIPASE, AMYLASE in the last 168 hours. No results for input(s): AMMONIA in the last 168 hours. Coagulation Profile: Recent Labs  Lab 03/07/24 2006  INR 1.1   Cardiac Enzymes: No results for input(s): CKTOTAL,  CKMB, CKMBINDEX, TROPONINI in the last 168 hours. BNP (last 3 results) No results for input(s): PROBNP in the last 8760 hours. HbA1C: Recent Labs    03/07/24 2010  HGBA1C 5.8*   CBG: No results for input(s): GLUCAP in the last 168 hours. Lipid Profile: Recent Labs    03/08/24 0427  CHOL 100  HDL 53  LDLCALC 41  TRIG 29  CHOLHDL 1.9   Thyroid Function Tests: Recent Labs    03/07/24 2308  TSH 1.776   Anemia Panel: No results for input(s): VITAMINB12, FOLATE, FERRITIN, TIBC, IRON, RETICCTPCT in the last 72 hours. Sepsis Labs: No results for input(s): PROCALCITON, LATICACIDVEN in the last 168 hours.  No results found for this or any previous visit (from the past 240 hours).   Radiology Studies: DG Chest Port 1 View Result Date: 03/07/2024 EXAM: 1 VIEW(S) XRAY OF THE CHEST 03/07/2024 10:42:00 PM COMPARISON: 04/01/2015 CLINICAL HISTORY:  Atrial fibrillation (HCC) FINDINGS: LUNGS AND PLEURA: No focal pulmonary opacity. No pleural effusion. No pneumothorax. HEART AND MEDIASTINUM: No acute abnormality of the cardiac and mediastinal silhouettes. BONES AND SOFT TISSUES: No acute osseous abnormality. IMPRESSION: 1. No acute cardiopulmonary process. Electronically signed by: Pinkie Pebbles MD 03/07/2024 10:46 PM EST RP Workstation: [id number]   MR BRAIN WO CONTRAST Result Date: 03/07/2024 CLINICAL DATA:  Initial evaluation for acute neuro deficit, stroke suspected. EXAM: MRI HEAD WITHOUT CONTRAST TECHNIQUE: Multiplanar, multiecho pulse sequences of the brain and surrounding structures were obtained without intravenous contrast. COMPARISON:  Comparison made with CTs from earlier the same day. FINDINGS: Brain: Cerebral volume within normal limits for age. Patchy T2/FLAIR hyperintensity involving the periventricular and deep white matter both cerebral hemispheres, consistent chronic small vessel ischemic disease, mild for age. Question subtle foci of diffusion  signal abnormality involving the left insular cortex and overlying left frontal lobe (series 5, images 78, 82). Findings are extremely subtle in nature, and could potentially be artifactual, although an underlying subtle acute left MCA distribution infarct could also be considered in the correct clinical setting. No associated mass effect. Small punctate chronic microhemorrhage noted within this region without definite associated w hemorrhage (series 12, image 36). No other evidence for acute or subacute ischemia. Gray-white matter differentiation otherwise maintained. No other acute or chronic intracranial blood products. No mass lesion, midline shift or mass effect. No hydrocephalus or extra-axial fluid collection. Pituitary gland within normal limits. Vascular: Major intracranial vascular flow voids are maintained. Skull and upper cervical spine: Craniocervical junction within normal limits. Bone marrow signal intensity normal. No scalp soft tissue abnormality. Sinuses/Orbits: Globes and orbital soft tissues within normal limits. Paranasal sinuses are largely clear. No significant mastoid effusion. Other: None. IMPRESSION: 1. Question few small foci of diffusion signal abnormality involving the left insular cortex and overlying left frontal lobe. Findings are extremely subtle in nature, and could potentially be artifactual, although an underlying subtle acute nonhemorrhagic left MCA distribution infarct could also be considered in the correct clinical setting. 2. No other acute intracranial abnormality. 3. Underlying mild chronic microvascular ischemic disease. Electronically Signed   By: Morene Hoard M.D.   On: 03/07/2024 22:01   CT ANGIO HEAD NECK W WO CM (CODE STROKE) Result Date: 03/07/2024 EXAM: CTA HEAD AND NECK WITH AND WITHOUT 03/07/2024 07:35:33 PM TECHNIQUE: CTA of the head and neck was performed with and without the administration of 60 mL of iohexol  (OMNIPAQUE ) 350 MG/ML injection.  Multiplanar 2D and/or 3D reformatted images are provided for review. Automated exposure control, iterative reconstruction, and/or weight based adjustment of the mA/kV was utilized to reduce the radiation dose to as low as reasonably achievable. Stenosis of the internal carotid arteries measured using NASCET criteria. COMPARISON: Same day CT head CLINICAL HISTORY: Neuro deficit, acute, stroke suspected. FINDINGS: CTA NECK: AORTIC ARCH AND ARCH VESSELS: Mild scattered atherosclerosis of the visualized aortic arch. Atherosclerosis of the proximal right subclavian artery without stenosis. No dissection or arterial injury. No significant stenosis of the brachiocephalic artery. CERVICAL CAROTID ARTERIES: Right carotid artery is patent from the origin to the skull base. Atherosclerosis of the right carotid bifurcation without stenosis. Additional atherosclerosis along the proximal right cervical ICA resulting in approximately 50% stenosis. Left carotid artery is patent from the origin to the skull base. Mild atherosclerosis of the left common carotid artery. Atherosclerosis at the left carotid bifurcation without hemodynamically significant stenosis. No dissection or arterial injury. CERVICAL VERTEBRAL ARTERIES: Vertebral arteries are patent from the origins  to the vertebrobasilar confluence. Left vertebral artery is dominant. Atherosclerosis of the right vertebral artery origin resulting in mild stenosis. There is tapering of the post PICA segment of the nondominant right vertebral artery. Atherosclerosis of the left V4 segment resulting in mild stenosis. No dissection or arterial injury. LUNGS AND MEDIASTINUM: Paraseptal and centrilobular emphysema in the upper lobes. SOFT TISSUES: Edentulous maxilla. No acute abnormality. BONES: Degenerative changes in the visualized spine. No acute abnormality. CTA HEAD: ANTERIOR CIRCULATION: Intracranial internal carotid arteries are patent bilaterally. Atherosclerosis of the  bilateral carotid siphons. Atherosclerosis along the right periclinoid ICA resulting in moderate stenosis. Middle cerebral arteries are patent bilaterally. Anterior cerebral arteries are patent bilaterally. No aneurysm. POSTERIOR CIRCULATION: Basilar artery is patent and is primarily supplied via the left vertebral artery. There is distal tapering of the basilar artery. Significant hypoplasia of the basilar artery distal to the origins of the superior cerebellar arteries. Superior cerebellar arteries are patent bilaterally. Posterior communicating arteries are visualized bilaterally. Posterior cerebral arteries are supplied by the posterior communicating arteries. No significant stenosis of the vertebral arteries. No aneurysm. OTHER: No dural venous sinus thrombosis on this non-dedicated study. IMPRESSION: 1. No large vessel occlusion or aneurysm in the head or neck. 2. 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 3. Approximately 50% stenosis of the proximal right cervical ICA. 4. Moderate stenosis of the right paraclinoid ICA. 5. Mild stenosis of the right vertebral artery origin. 6. Paraseptal and centrilobular emphysema in the upper lobes. Consider evaluation for low-dose CT lung cancer screening program. Electronically signed by: Donnice Mania MD 03/07/2024 07:58 PM EST RP Workstation: [id number]   CT HEAD CODE STROKE WO CONTRAST Result Date: 03/07/2024 CLINICAL DATA:  Code stroke. Initial evaluation for acute neuro deficit, stroke suspected. EXAM: CT HEAD WITHOUT CONTRAST TECHNIQUE: Contiguous axial images were obtained from the base of the skull through the vertex without intravenous contrast. RADIATION DOSE REDUCTION: This exam was performed according to the departmental dose-optimization program which includes automated exposure control, adjustment of the mA and/or kV according to patient size and/or use of iterative  reconstruction technique. COMPARISON:  None Available. FINDINGS: Brain: Cerebral volume within normal limits. No acute intracranial hemorrhage. No acute large vessel territory infarct. No mass lesion, midline shift or mass effect. No hydrocephalus or extra-axial fluid collection. Vascular: No abnormal hyperdense vessel. Scattered vascular calcifications noted within the carotid siphons. Skull: Scalp soft tissues demonstrate no acute finding. Calvarium intact. Sinuses/Orbits: Globes and orbital soft tissues within normal limits. Paranasal sinuses and mastoid air cells are largely clear. Other: None. ASPECTS [facility] Stroke Program Early CT Score) - Ganglionic level infarction (caudate, lentiform nuclei, internal capsule, insula, M1-M3 cortex): 7 - Supraganglionic infarction (M4-M6 cortex): 3 Total score (0-10 with 10 being normal): 10 IMPRESSION: 1. No acute intracranial abnormality. 2. ASPECTS is 10. These results were communicated to Dr. Vanessa at 7:38 pm on 03/07/2024 by text page via the [facility] messaging system. Electronically Signed   By: Morene Hoard M.D.   On: 03/07/2024 19:40    Scheduled Meds:  rosuvastatin   10 mg Oral QPM   Continuous Infusions:  diltiazem (CARDIZEM) infusion 7.5 mg/hr (03/08/24 0516)   heparin  1,150 Units/hr (03/08/24 0617)     LOS: 0 days   Fredia Skeeter, MD [facility]  03/08/2024, 8:00 AM   *Please note that this is a verbal dictation therefore any spelling or grammatical errors are due to the Dragon Medical One system interpretation.  Please  page via Amion and do not message via secure chat for urgent patient care matters. Secure chat can be used for non urgent patient care matters.  How to contact the [facility] Attending or Consulting provider 7A - 7P or covering provider during after hours 7P -7A, for this patient?  Check the care team in [facility] and look for a) attending/consulting [facility] provider listed and b) the [facility] team listed. Page or secure chat  7A-7P. Log into [email or web address] and use [place]'s universal password to access. If you do not have the password, please contact the hospital operator. Locate the [facility] provider you are looking for under [facility] and page to a number that you can be directly reached. If you still have difficulty reaching the provider, please page the [facility] (Director on Call) for the Hospitalists listed on amion for assistance.

## 2024-03-08 NOTE — Progress Notes (Signed)
 OT Cancellation Note  Patient Details Name: Gordon Hayes MRN: [id number] DOB: 01/18/1961   Cancelled Treatment:    Reason Eval/Treat Not Completed: Other (comment) (Attempted to see pt this AM, echo arriving at bedside. Will continue efforts.)   Lauriann Milillo M. Hayes, OTR/L [facility] [phone] Secure Chat Preferred  Gordon Hayes 03/08/2024, 10:07 AM

## 2024-03-08 NOTE — Progress Notes (Signed)
 ANTICOAGULATION CONSULT NOTE  Pharmacy Consult for Heparin  Indication: atrial fibrillation and stroke  Allergies  Allergen Reactions   Penicillins Anaphylaxis   Atorvastatin  Other (See Comments)    myalgias    Patient Measurements: Height: 5' 10 (177.8 cm) Weight: 85.2 kg (187 lb 13.3 oz) IBW/kg (Calculated) : 73 Heparin  Dosing Weight: 85.2 kg  Vital Signs: Temp: 98.6 F (37 C) (11/13 0600) Temp Source: Oral (11/13 0600) BP: 117/81 (11/13 0600) Pulse Rate: 82 (11/13 0600)  Labs: Recent Labs    03/07/24 1927 03/07/24 2006 03/08/24 0427  HGB 16.0 15.2 14.8  HCT 47.0 46.7 Gordon.8  PLT  --  201 178  APTT  --  29  --   LABPROT  --  14.3  --   INR  --  1.1  --   HEPARINUNFRC  --   --  0.14*  CREATININE 1.00 0.96  --     Estimated Creatinine Clearance: 82.4 mL/min (by C-G formula based on SCr of 0.96 mg/dL).   Medical History: Past Medical History:  Diagnosis Date   Back pain, lumbosacral    CAD S/P percutaneous coronary angioplasty 03/2015   OM2 99>0% w/ 2.75 mm x 16 mm (3.2 mm) Synergy DES; inferior OM2 branch 80%, med rx, pRCA 70%, mRCA 90% (s/p staged PCI w/ 2. 75 X 33 mm Xience Alpine DES);    Carpal tunnel syndrome, bilateral    COPD (chronic obstructive pulmonary disease) (HCC) 2013   DJD (degenerative joint disease) of knee    STEMI (ST elevation myocardial infarction) (HCC)  03/31/2015   Subtotal OM2 - PCI with DES. EF 45-50%; Staged PCI of RCA   Assessment: Gordon Hayes with a history of CAD, STEMI, COPD. Patient is presenting with right side weakness and Code stroke activation. Heparin  per pharmacy consult placed for atrial fibrillation and stroke.  Patient is not on anticoagulation prior to arrival.  Hgb 15.2; plt 201 PT/INR 1.3/1.1 aPTT 29  11/13 AM update:  Heparin  level sub-therapeutic   Goal of Therapy:  Heparin  level 0.3-0.5 units/ml Monitor platelets by anticoagulation protocol: Yes   Plan:  Inc heparin  infusion to 1150 units/hr Check  anti-Xa level in 8 hours and daily while on heparin  Continue to monitor H&H and platelets  Lynwood Mckusick, PharmD, BCPS Clinical Pharmacist Phone: [phone]

## 2024-03-09 ENCOUNTER — Other Ambulatory Visit (HOSPITAL_COMMUNITY): Payer: Self-pay

## 2024-03-09 ENCOUNTER — Telehealth: Payer: Self-pay | Admitting: *Deleted

## 2024-03-09 DIAGNOSIS — I509 Heart failure, unspecified: Secondary | ICD-10-CM | POA: Diagnosis not present

## 2024-03-09 DIAGNOSIS — I639 Cerebral infarction, unspecified: Secondary | ICD-10-CM | POA: Diagnosis not present

## 2024-03-09 DIAGNOSIS — I4891 Unspecified atrial fibrillation: Secondary | ICD-10-CM | POA: Diagnosis not present

## 2024-03-09 MED ORDER — BISOPROLOL FUMARATE 5 MG PO TABS
10.0000 mg | ORAL_TABLET | Freq: Every day | ORAL | 1 refills | Status: DC
  Filled 2024-03-09: qty 60, 30d supply, fill #0

## 2024-03-09 MED ORDER — APIXABAN 5 MG PO TABS
5.0000 mg | ORAL_TABLET | Freq: Two times a day (BID) | ORAL | 1 refills | Status: DC
  Filled 2024-03-09: qty 60, 30d supply, fill #0

## 2024-03-09 NOTE — Progress Notes (Signed)
 Pt ready for DC. All lines removed. AVS reviewed with pt and wife. Medications explained. VSS at DC. PT leaving with his wife.

## 2024-03-09 NOTE — Discharge Summary (Signed)
 Physician Discharge Summary  Patient ID: Gordon Hayes MRN: [id number] DOB/AGE: 12-29-1960 63 y.o.  Admit date: 03/07/2024 Discharge date: 03/09/2024  Admission Diagnoses:  Discharge Diagnoses:  Principal Problem:   Acute CVA (cerebrovascular accident) [facility]) Active Problems:   Atrial fibrillation with rapid ventricular response (HCC)   History of COPD   History of essential hypertension   Chronic diastolic CHF (congestive heart failure) (HCC)   Discharged Condition: stable  Hospital Course: Patient is a 63 year old male with past medical history significant for coronary artery disease, STEMI and COPD.  Patient was admitted with right-sided weakness and aphasia.  Symptoms resolved spontaneously.  Workup done revealed atrial fibrillation.  MRI brain revealed small strokes in the left frontal lobe.  Patient was initially managed with heparin  drip.  Patient will plan on Eliquis.  Patient has been cleared for discharge by the neurology and cardiology team.  Stroke:  left frontal strokes, etiology: Likely embolic in the setting of newly diagnosed A-fib CT head protocol did not reveal any acute abnormality.    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   LDL 41 HgbA1c 5.8 VTE prophylaxis -fully anticoagulated with heparin  initially, but now Eliquis.   Patient will be discharged on Eliquis. Patient was on Aspirin  81 mg po once daily prior to admission, but currently discontinued.     Atrial fibrillation with RVR, newly diagnosed History of CAD/STEMI Initially managed with heparin  drip and cardizem Patient will be discharged on Eliquis and Bisoprolol.     Hypertension Controlled    Hyperlipidemia Continue rosuvastatin  10 mg daily  LDL 41, goal < 70   Other Stroke Risk Factors ETOH use, alcohol level 32, advised to drink no more than 1-2 drink(s) a day   COPD: - Stable.    Consults: cardiology and neurology  Significant Diagnostic Studies:  MRI brain without contrast revealed: 1. Question few small foci of diffusion signal abnormality involving the left insular cortex and overlying left frontal lobe. Findings are extremely subtle in nature, and could potentially be artifactual, although an underlying subtle acute nonhemorrhagic left MCA distribution infarct could also be considered in the correct clinical setting. 2. No other acute intracranial abnormality. 3. Underlying mild chronic microvascular ischemic disease.  CT angio head and neck with and without contrast revealed: 1. No large vessel occlusion or aneurysm in the head or neck. 2. 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 3. Approximately 50% stenosis of the proximal right cervical ICA. 4. Moderate stenosis of the right paraclinoid ICA. 5. Mild stenosis of the right vertebral artery origin. 6. Paraseptal and centrilobular emphysema in the upper lobes. Consider evaluation for low-dose CT lung cancer screening program.  CT head code stroke without contrast revealed: 1. No acute intracranial abnormality. 2. ASPECTS is 10.  Echo revealed:  1. Left ventricular ejection fraction, by estimation, is 40 to 45%. The  left ventricle has mildly decreased function. The left ventricle  demonstrates global hypokinesis. Left ventricular diastolic function could  not be evaluated.   2. Right ventricular systolic function is normal. The right ventricular  size is normal. Tricuspid regurgitation signal is inadequate for assessing  PA pressure.   3. The mitral  valve is degenerative. Mild to  moderate mitral valve  regurgitation. No evidence of mitral stenosis. Moderate to severe mitral  annular calcification.   4. The aortic valve is normal in structure. Aortic valve regurgitation is  not visualized. No aortic stenosis is present.   5. The inferior vena cava is dilated in size with <50% respiratory  variability, suggesting right atrial pressure of 15 mmHg.   6. Agitated saline contrast bubble study was negative, with no evidence  of any interatrial shunt.    Discharge Exam: Blood pressure (!) 115/90, pulse 76, temperature 97.9 F (36.6 C), temperature source Oral, resp. rate 20, height 5' 10 (1.778 m), weight 85.2 kg, SpO2 96%.   Disposition: Discharge disposition: 01-Home or Self Care       Discharge Instructions     Ambulatory Referral for Lung Cancer Scre   Complete by: As directed    Ambulatory referral to Neurology   Complete by: As directed    Follow up with stroke clinic NP at [facility] in about 4-6 weeks. Thanks.   Diet - low sodium heart healthy   Complete by: As directed    Increase activity slowly   Complete by: As directed       Allergies as of 03/09/2024       Reactions   Penicillins Anaphylaxis   Atorvastatin  Other (See Comments)   myalgias        Medication List     STOP taking these medications    aspirin  EC 81 MG tablet   carvedilol  6.25 MG tablet Commonly known as: COREG        TAKE these medications    acetaminophen  500 MG tablet Commonly known as: TYLENOL  Take 1,000 mg by mouth every 6 (six) hours as needed for mild pain (pain score 1-3) (arthritis pain).   apixaban 5 MG Tabs tablet Commonly known as: ELIQUIS Take 1 tablet (5 mg total) by mouth 2 (two) times daily.   bisoprolol 10 MG tablet Commonly known as: ZEBETA Take 1 tablet (10 mg total) by mouth daily. Start taking on: March 10, 2024   nitroGLYCERIN  0.4 MG SL tablet Commonly known as: Nitrostat  Place 1 tablet (0.4 mg total) under the tongue every 5  (five) minutes as needed for chest pain.   rosuvastatin  10 MG tablet Commonly known as: CRESTOR  Take 1 tablet (10 mg total) by mouth daily. What changed: when to take this        Follow-up Information     [name]. Schedule an appointment as soon as possible for a visit in 1 month(s).   Specialty: Neurology Why: stroke clinic Contact information: [address] [place]  [address] [phone]        Chandra Toribio POUR, MD Follow up in 1 week(s).   Specialty: Family Medicine Contact information: [address] [place] [address] [phone]         [facility] [address] [facility] Follow up in 1 week(s).   Specialty: Cardiology Contact information: [address] [place]  [address] [phone]               Time spent: 35 minutes.  SignedBETHA Leatrice LILLETTE Rosario 03/09/2024, 1:59 PM

## 2024-03-09 NOTE — Plan of Care (Signed)
   Problem: Coping: Goal: Will verbalize positive feelings about self Outcome: Progressing

## 2024-03-09 NOTE — Telephone Encounter (Signed)
 Good afternoon Dr. Stacia,   Patient wife also called and stated patient recently had a stroke. Wished to cancel appointment for now. States they will call back to reschedule once other health concerns are sorted.   Thank you

## 2024-03-09 NOTE — Discharge Instructions (Signed)
 Information on my medicine - ELIQUIS (apixaban)  This medication education was reviewed with me or my healthcare representative as part of my discharge preparation.     Why was Eliquis prescribed for you? Eliquis was prescribed for you to reduce the risk of a blood clot forming that can cause a stroke if you have a medical condition called atrial fibrillation (a type of irregular heartbeat).  What do You need to know about Eliquis ? Take your Eliquis TWICE DAILY - one tablet in the morning and one tablet in the evening with or without food. If you have difficulty swallowing the tablet whole please discuss with your pharmacist how to take the medication safely.  Take Eliquis exactly as prescribed by your doctor and DO NOT stop taking Eliquis without talking to the doctor who prescribed the medication.  Stopping may increase your risk of developing a stroke.  Refill your prescription before you run out.  After discharge, you should have regular check-up appointments with your healthcare provider that is prescribing your Eliquis.  In the future your dose may need to be changed if your kidney function or weight changes by a significant amount or as you get older.  What do you do if you miss a dose? If you miss a dose, take it as soon as you remember on the same day and resume taking twice daily.  Do not take more than one dose of ELIQUIS at the same time to make up a missed dose.  Important Safety Information A possible side effect of Eliquis is bleeding. You should call your healthcare provider right away if you experience any of the following: Bleeding from an injury or your nose that does not stop. Unusual colored urine (red or dark brown) or unusual colored stools (red or black). Unusual bruising for unknown reasons. A serious fall or if you hit your head (even if there is no bleeding).  Some medicines may interact with Eliquis and might increase your risk of bleeding or clotting  while on Eliquis. To help avoid this, consult your healthcare provider or pharmacist prior to using any new prescription or non-prescription medications, including herbals, vitamins, non-steroidal anti-inflammatory drugs (NSAIDs) and supplements.  This website has more information on Eliquis (apixaban): http://www.eliquis.com/eliquis/home =======================================  Atrial Fibrillation    Atrial fibrillation is a type of heartbeat that is irregular or fast. If you have this condition, your heart beats without any order. This makes it hard for your heart to pump blood in a normal way. Atrial fibrillation may come and go, or it may become a long-lasting problem. If this condition is not treated, it can put you at higher risk for stroke, heart failure, and other heart problems.  What are the causes? This condition may be caused by diseases that damage the heart. They include: High blood pressure. Heart failure. Heart valve disease. Heart surgery. Other causes include: Diabetes. Thyroid disease. Being overweight. Kidney disease. Sometimes the cause is not known.  What increases the risk? You are more likely to develop this condition if: You are older. You smoke. You exercise often and very hard. You have a family history of this condition. You are a man. You use drugs. You drink a lot of alcohol. You have lung conditions, such as emphysema, pneumonia, or COPD. You have sleep apnea.  What are the signs or symptoms? Common symptoms of this condition include: A feeling that your heart is beating very fast. Chest pain or discomfort. Feeling short of breath. Suddenly feeling  light-headed or weak. Getting tired easily during activity. Fainting. Sweating. In some cases, there are no symptoms.  How is this treated? Treatment for this condition depends on underlying conditions and how you feel when you have atrial fibrillation. They include: Medicines to: Prevent  blood clots. Treat heart rate or heart rhythm problems. Using devices, such as a pacemaker, to correct heart rhythm problems. Doing surgery to remove the part of the heart that sends bad signals. Closing an area where clots can form in the heart (left atrial appendage). In some cases, your doctor will treat other underlying conditions.  Follow these instructions at home:  Medicines Take over-the-counter and prescription medicines only as told by your doctor. Do not take any new medicines without first talking to your doctor. If you are taking blood thinners: Talk with your doctor before you take any medicines that have aspirin or NSAIDs, such as ibuprofen, in them. Take your medicine exactly as told by your doctor. Take it at the same time each day. Avoid activities that could hurt or bruise you. Follow instructions about how to prevent falls. Wear a bracelet that says you are taking blood thinners. Or, carry a card that lists what medicines you take. Lifestyle         Do not use any products that have nicotine or tobacco in them. These include cigarettes, e-cigarettes, and chewing tobacco. If you need help quitting, ask your doctor. Eat heart-healthy foods. Talk with your doctor about the right eating plan for you. Exercise regularly as told by your doctor. Do not drink alcohol. Lose weight if you are overweight. Do not use drugs, including cannabis.  General instructions If you have a condition that causes breathing to stop for a short period of time (apnea), treat it as told by your doctor. Keep a healthy weight. Do not use diet pills unless your doctor says they are safe for you. Diet pills may make heart problems worse. Keep all follow-up visits as told by your doctor. This is important.  Contact a doctor if: You notice a change in the speed, rhythm, or strength of your heartbeat. You are taking a blood-thinning medicine and you get more bruising. You get tired more easily  when you move or exercise. You have a sudden change in weight.  Get help right away if:    You have pain in your chest or your belly (abdomen). You have trouble breathing. You have side effects of blood thinners, such as blood in your vomit, poop (stool), or pee (urine), or bleeding that cannot stop. You have any signs of a stroke. "BE FAST" is an easy way to remember the main warning signs: B - Balance. Signs are dizziness, sudden trouble walking, or loss of balance. E - Eyes. Signs are trouble seeing or a change in how you see. F - Face. Signs are sudden weakness or loss of feeling in the face, or the face or eyelid drooping on one side. A - Arms. Signs are weakness or loss of feeling in an arm. This happens suddenly and usually on one side of the body. S - Speech. Signs are sudden trouble speaking, slurred speech, or trouble understanding what people say. T - Time. Time to call emergency services. Write down what time symptoms started. You have other signs of a stroke, such as: A sudden, very bad headache with no known cause. Feeling like you may vomit (nausea). Vomiting. A seizure.  These symptoms may be an emergency. Do not wait to  see if the symptoms will go away. Get medical help right away. Call your local emergency services (911 in the [place] Do not drive yourself to the hospital. Summary Atrial fibrillation is a type of heartbeat that is irregular or fast. You are at higher risk of this condition if you smoke, are older, have diabetes, or are overweight. Follow your doctor's instructions about medicines, diet, exercise, and follow-up visits. Get help right away if you have signs or symptoms of a stroke. Get help right away if you cannot catch your breath, or you have chest pain or discomfort. This information is not intended to replace advice given to you by your health care provider. Make sure you discuss any questions you have with your health care provider. Document  Revised: 10/04/2018 Document Reviewed: 10/04/2018 Elsevier Patient Education  2020 [employer].

## 2024-03-09 NOTE — Progress Notes (Signed)
 Progress Note  Patient Name: Gordon Hayes Date of Encounter: 03/09/2024 [facility] Cardiologist: Alm Clay, MD   Interval Summary   Patient states that he is doing good this morning. He denies any chest pain or palpitations. States that he is ready to go home.   Vital Signs Vitals:   03/08/24 2014 03/08/24 2350 03/09/24 0436 03/09/24 0753  BP: 96/85 115/81 114/86 103/72  Pulse: 89 62 97 82  Resp: 16 14 16 20   Temp: 98.3 F (36.8 C) 97.9 F (36.6 C) 97.9 F (36.6 C) 97.8 F (36.6 C)  TempSrc: Oral Oral Oral Oral  SpO2: 95% 93% 93% 95%  Weight:      Height:        Intake/Output Summary (Last 24 hours) at 03/09/2024 0935 Last data filed at 03/08/2024 1527 Gross per 24 hour  Intake 343.45 ml  Output --  Net 343.45 ml      03/08/2024    5:00 AM 03/07/2024    7:28 PM 07/21/2023    8:11 AM  Last 3 Weights  Weight (lbs) 187 lb 13.3 oz 187 lb 13.3 oz 190 lb 12.8 oz  Weight (kg) 85.2 kg 85.2 kg 86.546 kg      Telemetry/ECG  Afib with controlled rates  - Personally Reviewed  Physical Exam  GEN: No acute distress.   Neck: No JVD Cardiac: irregularly irregular, no murmurs, rubs, or gallops.  Respiratory: Clear to auscultation bilaterally. GI: Soft, nontender, non-distended  MS: No edema bilaterally   Assessment & Plan  Afib  Patient was stopped on diltiazem and will be started on bisoprolol today. His rates have been coming down, most of the time on my evaluation he seems to be in the 80s but does go up to the 90s while talking sometimes. This is an improvement from yesterday - will continue him on bisoprolol 10 mg daily  - continue apixaban 5 mg BID  - Could have possible DCCV outpatient if needed   2. CHF with EF of 40 to 45%  With softer blood pressures do not want to be aggressive with GDMT and lower blood pressures further. Would plan for starting ARB or MRA as an outpatient   3. Acute stroke Likely to be embolic from afib.   4. 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 Can cause afib. Plan for outpatient sleep study   5. CAD s/p PCI Stable.   Signed, Rosalyn D'Mello, DO     I have personally evaluated and examined the patient. The history, physical exam, and medical decision making documented below were performed independently and substantively by me. I have reviewed all relevant data, formulated the assessment and plan, and assumed responsibility for the management of this patient. My documentation reflects the substantive portion of the split/shared visit, in accordance with CMS and CPT guidelines. Discussed with Dr.D'Mello I have personally performed the substantive portion of the medical decision making, including interpretation of diagnostic data, formulation of the management plan, and assessment of risks. In summation:  Patient notes no CP, SOB, Palpitations, Syncope.  Exam notable for  Gen: no distress   Neck: No JVD Cardiac: No Rubs or Gallops, no Murmur, iRRR +2 radial pulses Respiratory: Clear to auscultation bilaterally, normal effort, normal  respiratory rate GI: Soft, nontender, non-distended  MS: No edema;  moves all extremities Integument: Skin feels warm Neuro:  At time of evaluation, alert and oriented to person/place/time/situation  Psych: Normal affect, patient feels ok  Personally reviewed data and interpretation:   Tele: AF rates 88-110s with activity  In assessment and plan:  AF (unclear duration) - on AC and ASA as per neurology recs - Decrease LVEF - stop CCB, because of COPD I switched coreg  to bisoprolol at DC - if he has exercise asymptomatic tachycardia of rates of 120, I would not increase bisoprolol due to BP; and would not make additional changes; he has cardiology f/u with will get outpatient DCCV - discussed with family at length and discussed stroke sx to be concerned above  CAD s/p PCI - BB change as above - no evidence of ACS - continue  smoking cessation  OSA suspected - outpatient testing needed  Acute on chronic HF combined LVEF 40-45% - euvolemic, NYHA I, AF vs CAD - start bisoprolol - at 12/25 f/u if in SR start GDMT as tolerated (low BP- cannot tolerate ARB/ACEi ARNI at this time - if in persistent AF, plan for DCCV.  Stanly Leavens, MD FASE [facility] Cardiologist [facility]  [address] [place], [place] [address] [phone]  11:21 AM    Stanly Leavens, MD FASE [facility] Cardiologist [facility]  [address] [place], [address] [place], [place] [address] [phone]  11:13 AM

## 2024-03-12 ENCOUNTER — Telehealth: Payer: Self-pay

## 2024-03-12 NOTE — Transitions of Care (Post Inpatient/ED Visit) (Signed)
   03/12/2024  Name: Gordon Hayes MRN: [id number] DOB: [birth year]  Today's TOC FU Call Status: Today's TOC FU Call Status:: Successful TOC FU Call Completed TOC FU Call Complete Date: 03/12/24  Patient's Name and Date of Birth confirmed. Name, DOB  Transition Care Management Follow-up Telephone Call Date of Discharge: 03/09/24 Discharge Facility: [facility]) Type of Discharge: Inpatient Admission Primary Inpatient Discharge Diagnosis:: CVA How have you been since you were released from the hospital?: Better Any questions or concerns?: No  Items Reviewed: Did you receive and understand the discharge instructions provided?: Yes Medications obtained,verified, and reconciled?: Yes (Medications Reviewed) Any new allergies since your discharge?: No Dietary orders reviewed?: Yes Do you have support at home?: Yes People in Home [RPT]: spouse  Medications Reviewed Today: Medications Reviewed Today     Reviewed by Emmitt Pan, LPN (Licensed Practical Nurse) on 03/12/24 at 1227  Med List Status: <None>   Medication Order Taking? Sig Documenting Provider Last Dose Status Informant  acetaminophen  (TYLENOL ) 500 MG tablet [id number] Yes Take 1,000 mg by mouth every 6 (six) hours as needed for mild pain (pain score 1-3) (arthritis pain). [provider]  Active Spouse/Significant Other, Pharmacy Records  apixaban (ELIQUIS) 5 MG TABS tablet [id number] Yes Take 1 tablet (5 mg total) by mouth 2 (two) times daily. Rosario Eland I, MD  Active   bisoprolol (ZEBETA) 5 MG tablet [id number] Yes Take 2 tablets (10 mg total) by mouth daily. Rosario Eland FERNS, MD  Active   nitroGLYCERIN  (NITROSTAT ) 0.4 MG SL tablet [id number]  Place 1 tablet (0.4 mg total) under the tongue every 5 (five) minutes as needed for chest pain.  Patient not taking: Reported on 03/12/2024   Anner Alm ORN, MD  Active Spouse/Significant Other, Pharmacy Records           Med Note LEOBARDO, NICOLE   Wed Mar 07, 2024  9:26 PM) Rx appears to be expired, and spouse said he has not used them.   rosuvastatin  (CRESTOR ) 10 MG tablet [id number] Yes Take 1 tablet (10 mg total) by mouth daily.  Patient taking differently: Take 10 mg by mouth every evening.   Daneen Damien BROCKS, NP  Active Spouse/Significant Other, Pharmacy Records            Home Care and Equipment/Supplies: Were Home Health Services Ordered?: NA Any new equipment or medical supplies ordered?: NA  Functional Questionnaire: Do you need assistance with bathing/showering or dressing?: No Do you need assistance with meal preparation?: No Do you need assistance with eating?: No Do you have difficulty maintaining continence: No Do you need assistance with getting out of bed/getting out of a chair/moving?: No Do you have difficulty managing or taking your medications?: No  Follow up appointments reviewed: PCP Follow-up appointment confirmed?: Yes Date of PCP follow-up appointment?: 03/13/24 Follow-up Provider: [facility] Follow-up appointment confirmed?: Yes Date of Specialist follow-up appointment?: 03/20/24 Follow-Up Specialty Provider:: cardio Do you need transportation to your follow-up appointment?: No Do you understand care options if your condition(s) worsen?: Yes-patient verbalized understanding    SIGNATURE Pan Emmitt, LPN [facility] Nurse Health Advisor Direct Dial [phone]

## 2024-03-13 ENCOUNTER — Other Ambulatory Visit (HOSPITAL_COMMUNITY): Payer: Self-pay

## 2024-03-13 ENCOUNTER — Encounter: Payer: Self-pay | Admitting: Family Medicine

## 2024-03-13 ENCOUNTER — Other Ambulatory Visit: Payer: Self-pay | Admitting: Cardiology

## 2024-03-13 ENCOUNTER — Ambulatory Visit: Admitting: Family Medicine

## 2024-03-13 VITALS — BP 96/60 | HR 101 | Ht 70.0 in | Wt 184.0 lb

## 2024-03-13 DIAGNOSIS — R0683 Snoring: Secondary | ICD-10-CM | POA: Diagnosis not present

## 2024-03-13 DIAGNOSIS — I4891 Unspecified atrial fibrillation: Secondary | ICD-10-CM

## 2024-03-13 DIAGNOSIS — R400 Somnolence: Secondary | ICD-10-CM

## 2024-03-13 DIAGNOSIS — Z8673 Personal history of transient ischemic attack (TIA), and cerebral infarction without residual deficits: Secondary | ICD-10-CM | POA: Diagnosis not present

## 2024-03-13 MED ORDER — BISOPROLOL FUMARATE 10 MG PO TABS
10.0000 mg | ORAL_TABLET | Freq: Every day | ORAL | 3 refills | Status: AC
  Filled 2024-03-13 – 2024-03-30 (×2): qty 90, 90d supply, fill #0

## 2024-03-13 MED ORDER — ROSUVASTATIN CALCIUM 10 MG PO TABS
10.0000 mg | ORAL_TABLET | Freq: Every evening | ORAL | 3 refills | Status: AC
  Filled 2024-03-13 – 2024-03-30 (×2): qty 90, 90d supply, fill #0

## 2024-03-13 MED ORDER — NITROGLYCERIN 0.4 MG SL SUBL
0.4000 mg | SUBLINGUAL_TABLET | SUBLINGUAL | 3 refills | Status: AC | PRN
  Filled 2024-03-13 – 2024-03-30 (×2): qty 25, 8d supply, fill #0

## 2024-03-13 MED ORDER — APIXABAN 5 MG PO TABS
5.0000 mg | ORAL_TABLET | Freq: Two times a day (BID) | ORAL | 3 refills | Status: AC
  Filled 2024-03-13 – 2024-03-30 (×2): qty 180, 90d supply, fill #0

## 2024-03-13 NOTE — Patient Instructions (Signed)
 It was nice to see you today,  We addressed the following topics today: - Continue taking Eliquis, bisoprolol, and rosuvastatin  as prescribed. I have sent 90-day refills for these to the pharmacy. - I have sent the bisoprolol as one 10 mg tablet instead of two 5 mg tablets to make it easier. - I have also sent a refill for your nitroglycerin . Please discard the old prescription from 2023 as it is likely no longer effective. Keep the new bottle with you and out of the sunlight. - Someone from the sleep clinic will call you to schedule an in-person sleep study. Please complete this study. - It is okay to return to work, but avoid any heavy lifting initially. Consider starting with reduced hours to see how you feel with the new medications. - Continue to monitor your blood pressure at home. As long as the systolic is 90 or above and you are not feeling dizzy, the readings are acceptable. - You can use the Eliquis co-pay card you received from the hospital to help reduce the cost. You will likely need to renew it each year. - Please follow up with your cardiologist on December 8th. - Follow up with me in March for your yearly physical.  Have a great day,  Rolan Slain, MD

## 2024-03-13 NOTE — Assessment & Plan Note (Signed)
 Atrial Fibrillation with recent CVA - History of new onset A-fib diagnosed during recent hospitalization for CVA. Symptoms of left-sided weakness, aphasia, and facial droop have since resolved.  Now on Eliquis, bisoprolol, and rosuvastatin . BP today is 96/60, HR 103, but asymptomatic. - Continue Eliquis, bisoprolol, and rosuvastatin . - Changed bisoprolol from 5 mg two tabs daily to 10 mg one tab daily. Sent 90-day supply with 3 refills to community pharmacy on [address]. - Sent 90-day supply of Eliquis and rosuvastatin  with 3 refills. - Sent new prescription for nitroglycerin  SL. - Referral placed for an in-person sleep study to evaluate for sleep apnea as a risk factor for A-fib. - Follow up with Cardiology on 04/02/2024. - Follow up with Neurology as scheduled.

## 2024-03-13 NOTE — Progress Notes (Signed)
 Established Patient Office Visit  Subjective   Patient ID: Gordon Hayes, male    DOB: 11/07/1960  Age: 63 y.o. MRN: [id number]  Chief Complaint  Patient presents with   Hospitalization Follow-up    HPI  Subjective - Follow-up post-hospitalization for new onset atrial fibrillation and CVA. Feels well since discharge. Notes recent vital signs in office with BP 96/60 and HR 103, which is lower BP and higher HR than at home. No associated symptoms of dizziness. - Reports daytime fatigue. Wife notes snoring and tongue rolling during sleep. Admits to occasionally falling asleep on the couch during the day. - Recent CVA symptoms have resolved. During the event, experienced left arm weakness described as feeling like jello, lasting about 20 minutes. Also had speech difficulty and facial droop, which have resolved. Reports a headache behind the right eye at onset of symptoms.  Medications Medications include Eliquis, Bisoprolol, and Rosuvastatin . Patient is taking two 5 mg tablets of Bisoprolol daily. Denies bleeding issues with Eliquis or significant fatigue with Bisoprolol, though notes some general tiredness. Discussed cost of Eliquis; alternatives like Xarelto are similarly expensive, and Warfarin is less effective and requires more monitoring. Patient was given a co-pay card at the hospital. Needs refills for all medications, including nitroglycerin .  PMH, PSH, FH, Social Hx PMHx: Atrial Fibrillation (new diagnosis), CVA (recent, per hospital report scattered in multiple locations), hypertension, hyperlipidemia, CAD with two stents (placed 9 years ago after a heart attack). PSH: Coronary artery stenting. 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 Had two light beers prior to hospital admission; ethanol level of 32 on lab work was noted.  ROS Pertinent positives: Daytime fatigue, snoring. Pertinent negatives: No current left-sided weakness, speech difficulty, facial droop, vision  changes, or dizziness.  The ASCVD Risk score (Arnett DK, et al., 2019) failed to calculate for the following reasons:   Risk score cannot be calculated because patient has a medical history suggesting prior/existing ASCVD  Health Maintenance Due  Topic Date Due   Pneumococcal Vaccine: 50+ Years (1 of 2 - PCV) Never done   Lung Cancer Screening  Never done   Zoster Vaccines- Shingrix (1 of 2) Never done   Colonoscopy  01/24/2013   DTaP/Tdap/Td (2 - Td or Tdap) 11/22/2021   Influenza Vaccine  11/25/2023   COVID-19 Vaccine (4 - 2025-26 season) 12/26/2023      Objective:     BP 96/60   Pulse (!) 101   Ht 5' 10 (1.778 m)   Wt 184 lb (83.5 kg)   SpO2 97%   BMI 26.40 kg/m    Physical Exam General: No acute distress. CV: Regular rate and rhythm. PULM: Lungs clear to auscultation bilaterally. NEURO: Cranial nerves II-XII intact. No facial droop. Eyebrows raise symmetrically, cheeks puff symmetrically, eyes close tightly. Extraocular movements intact. Strength 5/5 in upper extremities bilaterally with symmetric grip. Sensation intact. No dysarthria.   No results found for any visits on 03/13/24.      Assessment & Plan:   Atrial fibrillation, unspecified type [facility]) Assessment & Plan: Atrial Fibrillation with recent CVA - History of new onset A-fib diagnosed during recent hospitalization for CVA. Symptoms of left-sided weakness, aphasia, and facial droop have since resolved.  Now on Eliquis, bisoprolol, and rosuvastatin . BP today is 96/60, HR 103, but asymptomatic. - Continue Eliquis, bisoprolol, and rosuvastatin . - Changed bisoprolol from 5 mg two tabs daily to 10 mg one tab daily. Sent 90-day supply with 3 refills to community pharmacy on  [address]. - Sent 90-day supply of Eliquis and rosuvastatin  with 3 refills. - Sent new prescription for nitroglycerin  SL. - Referral placed for an in-person sleep study to evaluate for sleep apnea as a risk factor for A-fib. - Follow  up with Cardiology on 04/02/2024. - Follow up with Neurology as scheduled.   Daytime somnolence -     PSG Sleep Study  History of CVA (cerebrovascular accident) Assessment & Plan: Symptoms have resolved.  Was evaluated for hh PT/OT/ST but was deemed to not need home health therapy.  Has f/u with cardiology in dec.  Is in the process of scheduling neuro appt.    Snoring -     PSG Sleep Study  Other orders -     Apixaban; Take 1 tablet (5 mg total) by mouth 2 (two) times daily.  Dispense: 180 tablet; Refill: 3 -     Bisoprolol Fumarate; Take 1 tablet (10 mg total) by mouth daily.  Dispense: 90 tablet; Refill: 3 -     Rosuvastatin  Calcium ; Take 1 tablet (10 mg total) by mouth every evening.  Dispense: 90 tablet; Refill: 3 -     Nitroglycerin ; DISSOLVE 1 TABLET UNDER THE TONGUE EVERY 5 MINUTES FOR UP TO 3 DOSES AS NEEDED FOR CHEST PAIN. IF NO RELIEF AFTER 3 DOSES, CALL 911 OR GO TO ER.  Dispense: 25 tablet; Refill: 3     Return in about 4 months (around 07/11/2024) for physical.    Toribio MARLA Slain, MD

## 2024-03-13 NOTE — Assessment & Plan Note (Signed)
 Symptoms have resolved.  Was evaluated for hh PT/OT/ST but was deemed to not need home health therapy.  Has f/u with cardiology in dec.  Is in the process of scheduling neuro appt.

## 2024-03-20 ENCOUNTER — Encounter

## 2024-03-23 ENCOUNTER — Other Ambulatory Visit (HOSPITAL_COMMUNITY): Payer: Self-pay

## 2024-03-30 ENCOUNTER — Other Ambulatory Visit (HOSPITAL_COMMUNITY): Payer: Self-pay

## 2024-04-01 NOTE — Progress Notes (Unsigned)
 Cardiology Office Note:    Date:  04/02/2024   ID:  SABASTIEN TYLER, DOB 1961/01/19, MRN [id number]  PCP:  Chandra Toribio POUR, MD   [facility] Providers Cardiologist:  Alm Clay, MD     Referring MD: Chandra Toribio POUR, MD   Chief complaint: Hospital follow-up     History of Present Illness:   Gordon Hayes is a 63 y.o. 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  He was hospitalized in December 2016 in the setting of STEMI. He underwent PCI/DES-LCx/OM 2, and DES-RCA, EF was 45 to 50% at the time. He was noted to have 80% stenosis to the inferior branch of OM 2, this was considered too small for PCI. Echocardiogram in 06/2015 showed EF improved to 55 to 60%, normal LV function, no RWMA, G1 DD, mild LVH, no significant valvular abnormalities.  He was last seen in the office 04/07/2023 by Damien Braver, NP, he was doing well at that time.  Presented to the ED on 03/07/2024 c/o trouble speaking, weakness in right arm, right sided headache, right sided visual changes.  Symptoms had mostly resolved on ED arrival, EKG showed A-fib with RVR, which was new for patient.  Bolus and drip of Cardizem  given.  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  MRI showed small foci of diffusion signal abnormality in the left insular cortex and left frontal lobe.  BNP 477.9.  Echo showing LVEF 40-45% with mildly decreased LV function and global hypokinesis, diastolic function could not be evaluated.  Normal RV.  Degenerative mitral valve, mild-moderate MV regurg, moderate-severe mitral annular calcification.  Normal AV.  Suggested RA pressure of 15 mmHg.  Negative bubble study, no evidence of  intra-atrial shunt.  Cardizem  switched to bisoprolol  secondary to decreased EF.  Started on Eliquis  by neurology, with plans for outpatient DCCV, sleep apnea testing, ARB (permissive HTN allowed 2/2 TIA for 48 hours), and possible MRA in future.   Presents independently, appears stable from a cardiovascular standpoint. He denies chest pain, palpitations, dyspnea, orthopnea, n, v,  dark/tarry/bloody stools, hematuria, dizziness, syncope, edema, weight gain. He reports some fatigue, he is cardiac unaware and did not know his heart rate was 129 in the office. States he's been checking at home and occasionally see's the rate in the 100s-110s, but hasn't seen it this high since his hospital visit. We did notice some discrepancy between the pulse ox and what the EKG stated, as the pulse ox was reading 70 while his HR was in the 120s. Does not appear to be in any active distress during the visit. Denies missing any doses of his Eliquis , and reports medication compliance. Works as an journalist, newspaper.  ROS:   Please see the history of present illness.    All other systems reviewed and are negative.     Past Medical History:  Diagnosis Date   Back pain, lumbosacral    CAD S/P percutaneous coronary angioplasty 03/2015   OM2 99>0% w/ 2.75 mm x 16 mm (3.2 mm) Synergy DES; inferior OM2 branch 80%, med rx, pRCA 70%, mRCA 90% (s/p staged PCI w/ 2. 75 X 33 mm Xience Alpine DES);    Carpal tunnel syndrome, bilateral    COPD (chronic obstructive pulmonary disease) (  HCC) 2013   DJD (degenerative joint disease) of knee    STEMI (ST elevation myocardial infarction) (HCC)  03/31/2015   Subtotal OM2 - PCI with DES. EF 45-50%; Staged PCI of RCA    Past Surgical History:  Procedure Laterality Date   CARDIAC CATHETERIZATION N/A 03/31/2015   Procedure: Left Heart Cath and Coronary Angiography;  Surgeon: Alm LELON Clay, MD;  Location: [facility] INVASIVE CV LAB;  Service: Cardiovascular;  Subtotoal OM2, 85% RCA with diffuse  upstream Dz. EF 45-50%   CARDIAC CATHETERIZATION N/A 03/31/2015   Procedure: Coronary Stent Intervention;  Surgeon: Alm LELON Clay, MD;  Location: [facility] INVASIVE CV LAB;: OM2 99% - Synergy DES 2.75 mm x 16 mm (3.2 mm).    CARDIAC CATHETERIZATION N/A 04/02/2015   Procedure: Coronary Stent Intervention;  Surgeon: Deatrice DELENA Cage, MD;  Location: [facility] INVASIVE CV LAB: STAGED PCI  of RCA  90% - Xience DES 2.75 mm x 33 mm   HERNIA REPAIR     right inguinal   POSTERIOR LAMINECTOMY / DECOMPRESSION LUMBAR SPINE  2007   TONSILLECTOMY     TRANSTHORACIC ECHOCARDIOGRAM  06/2015   EF 55-60%, mild LVH. Normal wall motion. Gr 1 DD.     Current Medications: Current Meds  Medication Sig   acetaminophen  (TYLENOL ) 500 MG tablet Take 1,000 mg by mouth every 6 (six) hours as needed for mild pain (pain score 1-3) (arthritis pain).   apixaban  (ELIQUIS ) 5 MG TABS tablet Take 1 tablet (5 mg total) by mouth 2 (two) times daily.   bisoprolol  (ZEBETA ) 10 MG tablet Take 1 tablet (10 mg total) by mouth daily.   nitroGLYCERIN  (NITROSTAT ) 0.4 MG SL tablet DISSOLVE 1 TABLET UNDER THE TONGUE EVERY 5 MINUTES FOR UP TO 3 DOSES AS NEEDED FOR CHEST PAIN. IF NO RELIEF AFTER 3 DOSES, CALL 911 OR GO TO ER.   rosuvastatin  (CRESTOR ) 10 MG tablet Take 1 tablet (10 mg total) by mouth every evening.     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  I don't  have my mind right to do it yet.  Substance and Sexual Activity   Alcohol use: Yes    Alcohol/week: 0.0 - 4.0 standard drinks of alcohol   Drug use: No   Sexual activity: Yes    Partners: Female  Other Topics Concern   Not on file  Social History Narrative   Denied application for additional life insurance policy due to recent diagnosis of COPD.   Social Drivers of Corporate Investment Banker Strain: Not on file  Food Insecurity: No Food Insecurity (03/08/2024)   Hunger Vital Sign    Worried About Running Out of Food in the Last Year: Never true    Ran Out of Food in the Last Year: Never true  Transportation Needs:  No Transportation Needs (03/08/2024)   PRAPARE - Administrator, Civil Service (Medical): No    Lack of Transportation (Non-Medical): No  Physical Activity: Not on file  Stress: Not on file  Social Connections: Not on file     Family History: The patient's family history includes Arthritis in his mother; Cancer in his maternal grandfather and paternal grandmother; Cancer (age of onset: 27) in his father; Hearing loss in his mother; Heart attack in his father; Heart disease in his father and paternal grandfather; Hypertension in his mother; Stroke in his father. There is no history of Colon cancer, Esophageal cancer, Rectal cancer, or Stomach cancer.  EKGs/Labs/Other Studies Reviewed:    The following studies were reviewed today:  EKG Interpretation Date/Time:  Monday April 02 2024 09:54:52 EST Ventricular Rate:  129 PR Interval:    QRS Duration:  98 QT Interval:  326 QTC Calculation: 477 R Axis:   94  Text Interpretation: Atrial fibrillation with rapid ventricular response Rightward axis Incomplete right bundle branch block Cannot rule out Inferior infarct , age undetermined When compared with ECG of 08-Mar-2024 11:01, Incomplete right bundle branch block is now Present Confirmed by Nikolas Casher [phone]) on 04/02/2024 10:02:28 AM    Recent  Labs: 03/07/2024: B Natriuretic Peptide 477.9; TSH 1.776 03/08/2024: ALT 27; BUN 9; Creatinine, Ser 0.92; Hemoglobin 14.8; Magnesium 2.0; Platelets 178; Potassium 4.0; Sodium 137  Recent Lipid Panel    Component Value Date/Time   CHOL 100 03/08/2024 0427   CHOL 110 04/07/2023 1200   TRIG 29 03/08/2024 0427   HDL 53 03/08/2024 0427   HDL 54 04/07/2023 1200   CHOLHDL 1.9 03/08/2024 0427   VLDL 6 03/08/2024 0427   LDLCALC 41 03/08/2024 0427   LDLCALC 45 04/07/2023 1200     Risk Assessment/Calculations:    CHA2DS2-VASc Score = 5   This indicates a 7.2% annual risk of stroke. The patient's score is based upon: CHF History: 1 HTN History: 1 Diabetes History: 1 Stroke History: 2 Vascular Disease History: 0 Age Score: 0 Gender Score: 0               Physical Exam:    VS:  BP 120/62 (BP Location: Left Arm, Patient Position: Sitting, Cuff Size: Normal)   Pulse 70   Resp 16   Ht 5' 10 (1.778 m)   Wt 193 lb 9.6 oz (87.8 kg)   SpO2 91%   BMI 27.78 kg/m        Wt Readings from Last 3 Encounters:  04/02/24 193 lb 9.6 oz (87.8 kg)  03/13/24 184 lb (83.5 kg)  03/08/24 187 lb 13.3 oz (85.2 kg)     GEN:  Well nourished, well developed in no acute distress HEENT: Normal NECK: No carotid bruits CARDIAC:  S1-S2 normal, irregularly irregular, tachycardic, no murmurs, rubs, gallops RESPIRATORY:  Clear to auscultation without rales, wheezing or rhonchi  MUSCULOSKELETAL:  No edema; No deformity  SKIN: Warm and dry NEUROLOGIC:  Alert and oriented x 3 PSYCHIATRIC:  Normal affect       Assessment & Plan Atrial fibrillation with RVR (HCC) EKG: Atrial fibrillation with rapid ventricular response Rightward axis Incomplete right bundle branch block Cannot rule out Inferior infarct, age undetermined, 129 bpm HR not accurately detected via pulse oximeter probe Presented to ED 03/07/2024 w/ new A. Fib w/ RVR Switched from diltiazem  to bisoprolol  for rate control 2/2  HFmrEF Aside from reported fatigue, patient asymptomatic and cardiac unaware Denies CP, SOB, palpitations,  dizziness, near syncope, edema Reports compliance w/ OAC, denies bleeding/bruising issues Continue eliquis  5 mg daily as this is the correct dose for his age (37), weight (87.8 kg), creatinine (0.92) Continue bisoprolol  10 mg daily, take extra dose tonight 2/2 RVR in office Discussed case w/ DOD, Dr. Ren, who agrees with plan to set up TEE/DCCV tomorrow. Given the patient's recent CVA, it is imperative we perform the TEE prior to DCCV to rule out any further clot burden prior to cardioversion to prevent secondary CVA going forward.  Discussed risks and benefits of the procedure w/ the patient and called his wife to update her per patient request, both verbalized understanding and agreement with the plan. There is question of whether patient will need continued high dose bisoprolol  following procedure, will need to be determined once we know heart rate following procedure. If unsuccessful, would plan for referral to EP/A. Fib clinic for further rhythm management options to be discussed. Will plan for patient to keep follow up with Dr. Anner in one month as scheduled, however if he feels any concerning symptoms or see's his heart rate is elevated before then, he is instructed to call the office or proceed to the nearest emergency department.  Chronic heart failure with mildly reduced ejection fraction (HFmrEF, 41-49%) (HCC) Echo 03/08/2024: LVEF 40-45% with mildly decreased LV function and global hypokinesis, diastolic function could not be evaluated.  Normal RV.  Degenerative mitral valve, mild-moderate MV regurg, moderate-severe mitral annular calcification.  Normal AV.  Suggested RA pressure of 15 mmHg.  Negative bubble study, no evidence of intra-atrial shunt. 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 Findings are extremely subtle in nature, and could potentially be artifactual, although an underlying subtle acute nonhemorrhagic left. MCA distribution infarct could also be considered in the correct clinical setting. Follows w/ neurology, has next appointment with them next week. Coronary artery disease involving native coronary artery of native heart, unspecified whether angina present STEMI 03/31/2015: OM2 PCI/DES, staged PCI of mRCA Denies CP, SOB, palpitations, near syncope Not on ASA 2/2 OAC Denies recent nitroglycerin  use Continue crestor  10 mg daily Primary hypertension BPs reported well controlled at home Continue bisoprolol  10 mg  Hyperlipidemia with target low density lipoprotein (LDL) cholesterol less than 55 mg/dL Lipid panel: chol 899, trig 29, HDL 53, LDL 41 Continue crestor  10 mg daily  Follow up with Dr. Anner in January as scheduled Call the office if you notice continued elevated HRs (>110) following your procedure tomorrow Proceed to the ED with any new, worsening, or concerning symptoms     Informed Consent   Shared Decision Making/Informed Consent   The risks [stroke, cardiac arrhythmias rarely resulting in the need for a temporary or permanent pacemaker, skin irritation or burns, esophageal damage, perforation (1:10,000 risk), bleeding, pharyngeal hematoma as well as other potential complications associated with conscious sedation including aspiration, arrhythmia, respiratory failure and death], benefits (treatment guidance, restoration of normal sinus rhythm, diagnostic support)  and alternatives of a transesophageal echocardiogram guided cardioversion were discussed in detail with Mr. Uhls and  he is willing to proceed.       Medication Adjustments/Labs and Tests Ordered: Current medicines are reviewed at length with the patient today.  Concerns regarding medicines are outlined above.  Orders Placed This Encounter  Procedures   EKG 12-Lead   TRANSESOPHAGEAL ECHOCARDIOGRAM WITH CARDIOVERSION   No orders of the defined types were placed in this encounter.   Patient Instructions  Medication Instructions:  NO CHANGES *If you need a refill on your cardiac medications before your next appointment, please call your pharmacy*  Lab Work: NO LABS If you have labs (blood work) drawn today and your tests are completely normal, you will receive your results only by: MyChart Message (if you have MyChart) OR A paper copy in the mail If you have any lab test that is abnormal or we need to change your treatment, we will call you to review the results.  Testing/Procedures:     Dear Pharoah L Woodlief  You are scheduled for a TEE (Transesophageal Echocardiogram) Guided Cardioversion on Tuesday, December 9 with Dr. Jeffrie.  Please arrive at the [facility] (Main Entrance A) at [facility]: [address] [place], [place] [address] at 10:00 AM (This time is 1 hour(s) before your procedure to ensure your preparation).   Free valet parking service is available. You will check in at ADMITTING.   *Please Note: You will receive a call the day before your procedure to confirm the appointment time. That time may have changed from the original time based on the schedule for that day.*    DIET:  Nothing to eat or drink after midnight except a sip of water with medications (see medication instructions below)  MEDICATION INSTRUCTIONS: !!IF ANY NEW MEDICATIONS ARE STARTED AFTER TODAY, PLEASE NOTIFY YOUR PROVIDER AS SOON AS POSSIBLE!!  FYI: Medications such as Semaglutide  (Ozempic, Wegovy), Tirzepatide (Mounjaro, Zepbound), Dulaglutide (Trulicity), etc (GLP1 agonists) AND Canagliflozin (Invokana), Dapagliflozin (Farxiga), Empagliflozin (Jardiance), Ertugliflozin (Steglatro), Bexagliflozin [employer]) or any combination with one of these drugs such as Invokamet (Canagliflozin/Metformin), Synjardy (Empagliflozin/Metformin), etc (SGLT2 inhibitors) must be held around the time of a procedure. This is not a comprehensive list of all of these drugs. Please review all of your medications and talk to your provider if you take any one of these. If you are not sure, ask your provider.         :1}Continue taking your anticoagulant (blood thinner): Apixaban  (Eliquis ).  You will need to continue this after your procedure until you are told by your provider that it is safe to stop.    TAKE EXTRA DOSE OF BISOPROLOL  TONIGHT 04/02/24. MAY TAKE ALL OTHER REGULAR MEDICATIONS AS INSTRUCTED.  LABS: NO LABS NEEDED    FYI:  For your safety, and to allow us  to monitor your vital signs accurately during the surgery/procedure we request: If you have artificial nails, gel coating, SNS etc, please have those removed prior to your surgery/procedure. Not having the nail coverings /polish removed may result in cancellation or delay of your surgery/procedure.  Your support person will be asked to wait in the waiting room during your procedure.  It is OK to have someone drop you off and come back when you are ready to be discharged.  You cannot drive after the procedure and will need someone to drive you home.  Bring your insurance cards.  *Special Note: Every effort is made to have your procedure done on time. Occasionally there are emergencies that occur at the hospital that may cause delays.  Please be patient if a delay does occur.      Follow-Up: At  Hospital, you and your health needs are our priority.  As part of our continuing mission to provide you with exceptional  heart care, our providers are all part of one team.  This team includes your primary Cardiologist (physician) and Advanced Practice Providers or APPs (Physician Assistants and Nurse Practitioners) who all work together to provide you with the care you need, when you need it.  Your next appointment:   4 week(s)  Provider:   Miriam Shams, NP   Then, Alm Clay, MD will plan to see you again in 4 month(s).      Signed, Miriam FORBES Shams, NP  04/02/2024 11:21 AM     [facility]

## 2024-04-01 NOTE — H&P (View-Only) (Signed)
 Cardiology Office Note:    Date:  04/02/2024   ID:  Gordon Hayes, DOB 04-Mar-1961, MRN [id number]  PCP:  Gordon Toribio POUR, MD   [facility] Providers Cardiologist:  Gordon Clay, MD     Referring MD: Gordon Toribio POUR, MD   Chief complaint: Hospital follow-up     History of Present Illness:   Gordon Hayes is a 63 y.o. 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  He was hospitalized in December 2016 in the setting of STEMI. He underwent PCI/DES-LCx/OM 2, and DES-RCA, EF was 45 to 50% at the time. He was noted to have 80% stenosis to the inferior branch of OM 2, this was considered too small for PCI. Echocardiogram in 06/2015 showed EF improved to 55 to 60%, normal LV function, no RWMA, G1 DD, mild LVH, no significant valvular abnormalities.  He was last seen in the office 04/07/2023 by Gordon Braver, NP, he was doing well at that time.  Presented to the ED on 03/07/2024 c/o trouble speaking, weakness in right arm, right sided headache, right sided visual changes.  Symptoms had mostly resolved on ED arrival, EKG showed A-fib with RVR, which was new for patient.  Bolus and drip of Cardizem  given.  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  MRI showed small foci of diffusion signal abnormality in the left insular cortex and left frontal lobe.  BNP 477.9.  Echo showing LVEF 40-45% with mildly decreased LV function and global hypokinesis, diastolic function could not be evaluated.  Normal RV.  Degenerative mitral valve, mild-moderate MV regurg, moderate-severe mitral annular calcification.  Normal AV.  Suggested RA pressure of 15 mmHg.  Negative bubble study, no evidence of  intra-atrial shunt.  Cardizem  switched to bisoprolol  secondary to decreased EF.  Started on Eliquis  by neurology, with plans for outpatient DCCV, sleep apnea testing, ARB (permissive HTN allowed 2/2 TIA for 48 hours), and possible MRA in future.   Presents independently, appears stable from a cardiovascular standpoint. He denies chest pain, palpitations, dyspnea, orthopnea, n, v,  dark/tarry/bloody stools, hematuria, dizziness, syncope, edema, weight gain. He reports some fatigue, he is cardiac unaware and did not know his heart rate was 129 in the office. States he's been checking at home and occasionally see's the rate in the 100s-110s, but hasn't seen it this high since his hospital visit. We did notice some discrepancy between the pulse ox and what the EKG stated, as the pulse ox was reading 70 while his HR was in the 120s. Does not appear to be in any active distress during the visit. Denies missing any doses of his Eliquis , and reports medication compliance. Works as an journalist, newspaper.  ROS:   Please see the history of present illness.    All other systems reviewed and are negative.     Past Medical History:  Diagnosis Date   Back pain, lumbosacral    CAD S/P percutaneous coronary angioplasty 03/2015   OM2 99>0% w/ 2.75 mm x 16 mm (3.2 mm) Synergy DES; inferior OM2 branch 80%, med rx, pRCA 70%, mRCA 90% (s/p staged PCI w/ 2. 75 X 33 mm Xience Alpine DES);    Carpal tunnel syndrome, bilateral    COPD (chronic obstructive pulmonary disease) (  HCC) 2013   DJD (degenerative joint disease) of knee    STEMI (ST elevation myocardial infarction) (HCC)  03/31/2015   Subtotal OM2 - PCI with DES. EF 45-50%; Staged PCI of RCA    Past Surgical History:  Procedure Laterality Date   CARDIAC CATHETERIZATION N/A 03/31/2015   Procedure: Left Heart Cath and Coronary Angiography;  Surgeon: Gordon LELON Clay, MD;  Location: [facility] INVASIVE CV LAB;  Service: Cardiovascular;  Subtotoal OM2, 85% RCA with diffuse  upstream Dz. EF 45-50%   CARDIAC CATHETERIZATION N/A 03/31/2015   Procedure: Coronary Stent Intervention;  Surgeon: Gordon LELON Clay, MD;  Location: [facility] INVASIVE CV LAB;: OM2 99% - Synergy DES 2.75 mm x 16 mm (3.2 mm).    CARDIAC CATHETERIZATION N/A 04/02/2015   Procedure: Coronary Stent Intervention;  Surgeon: Gordon DELENA Cage, MD;  Location: [facility] INVASIVE CV LAB: STAGED PCI  of RCA  90% - Xience DES 2.75 mm x 33 mm   HERNIA REPAIR     right inguinal   POSTERIOR LAMINECTOMY / DECOMPRESSION LUMBAR SPINE  2007   TONSILLECTOMY     TRANSTHORACIC ECHOCARDIOGRAM  06/2015   EF 55-60%, mild LVH. Normal wall motion. Gr 1 DD.     Current Medications: Current Meds  Medication Sig   acetaminophen  (TYLENOL ) 500 MG tablet Take 1,000 mg by mouth every 6 (six) hours as needed for mild pain (pain score 1-3) (arthritis pain).   apixaban  (ELIQUIS ) 5 MG TABS tablet Take 1 tablet (5 mg total) by mouth 2 (two) times daily.   bisoprolol  (ZEBETA ) 10 MG tablet Take 1 tablet (10 mg total) by mouth daily.   nitroGLYCERIN  (NITROSTAT ) 0.4 MG SL tablet DISSOLVE 1 TABLET UNDER THE TONGUE EVERY 5 MINUTES FOR UP TO 3 DOSES AS NEEDED FOR CHEST PAIN. IF NO RELIEF AFTER 3 DOSES, CALL 911 OR GO TO ER.   rosuvastatin  (CRESTOR ) 10 MG tablet Take 1 tablet (10 mg total) by mouth every evening.     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  I don't  have my mind right to do it yet.  Substance and Sexual Activity   Alcohol use: Yes    Alcohol/week: 0.0 - 4.0 standard drinks of alcohol   Drug use: No   Sexual activity: Yes    Partners: Female  Other Topics Concern   Not on file  Social History Narrative   Denied application for additional life insurance policy due to recent diagnosis of COPD.   Social Drivers of Corporate Investment Banker Strain: Not on file  Food Insecurity: No Food Insecurity (03/08/2024)   Hunger Vital Sign    Worried About Running Out of Food in the Last Year: Never true    Ran Out of Food in the Last Year: Never true  Transportation Needs:  No Transportation Needs (03/08/2024)   PRAPARE - Administrator, Civil Service (Medical): No    Lack of Transportation (Non-Medical): No  Physical Activity: Not on file  Stress: Not on file  Social Connections: Not on file     Family History: The patient's family history includes Arthritis in his mother; Cancer in his maternal grandfather and paternal grandmother; Cancer (age of onset: 22) in his father; Hearing loss in his mother; Heart attack in his father; Heart disease in his father and paternal grandfather; Hypertension in his mother; Stroke in his father. There is no history of Colon cancer, Esophageal cancer, Rectal cancer, or Stomach cancer.  EKGs/Labs/Other Studies Reviewed:    The following studies were reviewed today:  EKG Interpretation Date/Time:  Monday April 02 2024 09:54:52 EST Ventricular Rate:  129 PR Interval:    QRS Duration:  98 QT Interval:  326 QTC Calculation: 477 R Axis:   94  Text Interpretation: Atrial fibrillation with rapid ventricular response Rightward axis Incomplete right bundle branch block Cannot rule out Inferior infarct , age undetermined When compared with ECG of 08-Mar-2024 11:01, Incomplete right bundle branch block is now Present Confirmed by Jeania Nater [phone]) on 04/02/2024 10:02:28 AM    Recent  Labs: 03/07/2024: B Natriuretic Peptide 477.9; TSH 1.776 03/08/2024: ALT 27; BUN 9; Creatinine, Ser 0.92; Hemoglobin 14.8; Magnesium 2.0; Platelets 178; Potassium 4.0; Sodium 137  Recent Lipid Panel    Component Value Date/Time   CHOL 100 03/08/2024 0427   CHOL 110 04/07/2023 1200   TRIG 29 03/08/2024 0427   HDL 53 03/08/2024 0427   HDL 54 04/07/2023 1200   CHOLHDL 1.9 03/08/2024 0427   VLDL 6 03/08/2024 0427   LDLCALC 41 03/08/2024 0427   LDLCALC 45 04/07/2023 1200     Risk Assessment/Calculations:    CHA2DS2-VASc Score = 5   This indicates a 7.2% annual risk of stroke. The patient's score is based upon: CHF History: 1 HTN History: 1 Diabetes History: 1 Stroke History: 2 Vascular Disease History: 0 Age Score: 0 Gender Score: 0               Physical Exam:    VS:  BP 120/62 (BP Location: Left Arm, Patient Position: Sitting, Cuff Size: Normal)   Pulse 70   Resp 16   Ht 5' 10 (1.778 m)   Wt 193 lb 9.6 oz (87.8 kg)   SpO2 91%   BMI 27.78 kg/m        Wt Readings from Last 3 Encounters:  04/02/24 193 lb 9.6 oz (87.8 kg)  03/13/24 184 lb (83.5 kg)  03/08/24 187 lb 13.3 oz (85.2 kg)     GEN:  Well nourished, well developed in no acute distress HEENT: Normal NECK: No carotid bruits CARDIAC:  S1-S2 normal, irregularly irregular, tachycardic, no murmurs, rubs, gallops RESPIRATORY:  Clear to auscultation without rales, wheezing or rhonchi  MUSCULOSKELETAL:  No edema; No deformity  SKIN: Warm and dry NEUROLOGIC:  Alert and oriented x 3 PSYCHIATRIC:  Normal affect       Assessment & Plan Atrial fibrillation with RVR (HCC) EKG: Atrial fibrillation with rapid ventricular response Rightward axis Incomplete right bundle branch block Cannot rule out Inferior infarct, age undetermined, 129 bpm HR not accurately detected via pulse oximeter probe Presented to ED 03/07/2024 w/ new A. Fib w/ RVR Switched from diltiazem  to bisoprolol  for rate control 2/2  HFmrEF Aside from reported fatigue, patient asymptomatic and cardiac unaware Denies CP, SOB, palpitations,  dizziness, near syncope, edema Reports compliance w/ OAC, denies bleeding/bruising issues Continue eliquis  5 mg daily as this is the correct dose for his age (80), weight (87.8 kg), creatinine (0.92) Continue bisoprolol  10 mg daily, take extra dose tonight 2/2 RVR in office Discussed case w/ DOD, Dr. Ren, who agrees with plan to set up TEE/DCCV tomorrow. Given the patient's recent CVA, it is imperative we perform the TEE prior to DCCV to rule out any further clot burden prior to cardioversion to prevent secondary CVA going forward.  Discussed risks and benefits of the procedure w/ the patient and called his wife to update her per patient request, both verbalized understanding and agreement with the plan. There is question of whether patient will need continued high dose bisoprolol  following procedure, will need to be determined once we know heart rate following procedure. If unsuccessful, would plan for referral to EP/A. Fib clinic for further rhythm management options to be discussed. Will plan for patient to keep follow up with Dr. Anner in one month as scheduled, however if he feels any concerning symptoms or see's his heart rate is elevated before then, he is instructed to call the office or proceed to the nearest emergency department.  Chronic heart failure with mildly reduced ejection fraction (HFmrEF, 41-49%) (HCC) Echo 03/08/2024: LVEF 40-45% with mildly decreased LV function and global hypokinesis, diastolic function could not be evaluated.  Normal RV.  Degenerative mitral valve, mild-moderate MV regurg, moderate-severe mitral annular calcification.  Normal AV.  Suggested RA pressure of 15 mmHg.  Negative bubble study, no evidence of intra-atrial shunt. 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 Findings are extremely subtle in nature, and could potentially be artifactual, although an underlying subtle acute nonhemorrhagic left. MCA distribution infarct could also be considered in the correct clinical setting. Follows w/ neurology, has next appointment with them next week. Coronary artery disease involving native coronary artery of native heart, unspecified whether angina present STEMI 03/31/2015: OM2 PCI/DES, staged PCI of mRCA Denies CP, SOB, palpitations, near syncope Not on ASA 2/2 OAC Denies recent nitroglycerin  use Continue crestor  10 mg daily Primary hypertension BPs reported well controlled at home Continue bisoprolol  10 mg  Hyperlipidemia with target low density lipoprotein (LDL) cholesterol less than 55 mg/dL Lipid panel: chol 899, trig 29, HDL 53, LDL 41 Continue crestor  10 mg daily  Follow up with Dr. Anner in January as scheduled Call the office if you notice continued elevated HRs (>110) following your procedure tomorrow Proceed to the ED with any new, worsening, or concerning symptoms     Informed Consent   Shared Decision Making/Informed Consent   The risks [stroke, cardiac arrhythmias rarely resulting in the need for a temporary or permanent pacemaker, skin irritation or burns, esophageal damage, perforation (1:10,000 risk), bleeding, pharyngeal hematoma as well as other potential complications associated with conscious sedation including aspiration, arrhythmia, respiratory failure and death], benefits (treatment guidance, restoration of normal sinus rhythm, diagnostic support)  and alternatives of a transesophageal echocardiogram guided cardioversion were discussed in detail with Mr. Fritchman and  he is willing to proceed.       Medication Adjustments/Labs and Tests Ordered: Current medicines are reviewed at length with the patient today.  Concerns regarding medicines are outlined above.  Orders Placed This Encounter  Procedures   EKG 12-Lead   TRANSESOPHAGEAL ECHOCARDIOGRAM WITH CARDIOVERSION   No orders of the defined types were placed in this encounter.   Patient Instructions  Medication Instructions:  NO CHANGES *If you need a refill on your cardiac medications before your next appointment, please call your pharmacy*  Lab Work: NO LABS If you have labs (blood work) drawn today and your tests are completely normal, you will receive your results only by: MyChart Message (if you have MyChart) OR A paper copy in the mail If you have any lab test that is abnormal or we need to change your treatment, we will call you to review the results.  Testing/Procedures:     Dear Tasman L Carlson  You are scheduled for a TEE (Transesophageal Echocardiogram) Guided Cardioversion on Tuesday, December 9 with Dr. Jeffrie.  Please arrive at the [facility] (Main Entrance A) at [facility]: [address] [place], [place] [address] at 10:00 AM (This time is 1 hour(s) before your procedure to ensure your preparation).   Free valet parking service is available. You will check in at ADMITTING.   *Please Note: You will receive a call the day before your procedure to confirm the appointment time. That time may have changed from the original time based on the schedule for that day.*    DIET:  Nothing to eat or drink after midnight except a sip of water with medications (see medication instructions below)  MEDICATION INSTRUCTIONS: !!IF ANY NEW MEDICATIONS ARE STARTED AFTER TODAY, PLEASE NOTIFY YOUR PROVIDER AS SOON AS POSSIBLE!!  FYI: Medications such as Semaglutide  (Ozempic, Wegovy), Tirzepatide (Mounjaro, Zepbound), Dulaglutide (Trulicity), etc (GLP1 agonists) AND Canagliflozin (Invokana), Dapagliflozin (Farxiga), Empagliflozin (Jardiance), Ertugliflozin (Steglatro), Bexagliflozin [employer]) or any combination with one of these drugs such as Invokamet (Canagliflozin/Metformin), Synjardy (Empagliflozin/Metformin), etc (SGLT2 inhibitors) must be held around the time of a procedure. This is not a comprehensive list of all of these drugs. Please review all of your medications and talk to your provider if you take any one of these. If you are not sure, ask your provider.         :1}Continue taking your anticoagulant (blood thinner): Apixaban  (Eliquis ).  You will need to continue this after your procedure until you are told by your provider that it is safe to stop.    TAKE EXTRA DOSE OF BISOPROLOL  TONIGHT 04/02/24. MAY TAKE ALL OTHER REGULAR MEDICATIONS AS INSTRUCTED.  LABS: NO LABS NEEDED    FYI:  For your safety, and to allow us  to monitor your vital signs accurately during the surgery/procedure we request: If you have artificial nails, gel coating, SNS etc, please have those removed prior to your surgery/procedure. Not having the nail coverings /polish removed may result in cancellation or delay of your surgery/procedure.  Your support person will be asked to wait in the waiting room during your procedure.  It is OK to have someone drop you off and come back when you are ready to be discharged.  You cannot drive after the procedure and will need someone to drive you home.  Bring your insurance cards.  *Special Note: Every effort is made to have your procedure done on time. Occasionally there are emergencies that occur at the hospital that may cause delays.  Please be patient if a delay does occur.      Follow-Up: At [facility], you and your health needs are our priority.  As part of our continuing mission to provide you with exceptional  heart care, our providers are all part of one team.  This team includes your primary Cardiologist (physician) and Advanced Practice Providers or APPs (Physician Assistants and Nurse Practitioners) who all work together to provide you with the care you need, when you need it.  Your next appointment:   4 week(s)  Provider:   Miriam Shams, NP   Then, Gordon Clay, MD will plan to see you again in 4 month(s).      Signed, Miriam FORBES Shams, NP  04/02/2024 11:21 AM    [facility]

## 2024-04-02 ENCOUNTER — Encounter: Admitting: Gastroenterology

## 2024-04-02 ENCOUNTER — Encounter: Payer: Self-pay | Admitting: Physician Assistant

## 2024-04-02 ENCOUNTER — Ambulatory Visit: Attending: Physician Assistant | Admitting: Emergency Medicine

## 2024-04-02 VITALS — BP 120/62 | HR 70 | Resp 16 | Ht 70.0 in | Wt 193.6 lb

## 2024-04-02 DIAGNOSIS — I2510 Atherosclerotic heart disease of native coronary artery without angina pectoris: Secondary | ICD-10-CM

## 2024-04-02 DIAGNOSIS — I4891 Unspecified atrial fibrillation: Secondary | ICD-10-CM | POA: Diagnosis not present

## 2024-04-02 DIAGNOSIS — E785 Hyperlipidemia, unspecified: Secondary | ICD-10-CM

## 2024-04-02 DIAGNOSIS — I6340 Cerebral infarction due to embolism of unspecified cerebral artery: Secondary | ICD-10-CM | POA: Diagnosis not present

## 2024-04-02 DIAGNOSIS — I10 Essential (primary) hypertension: Secondary | ICD-10-CM

## 2024-04-02 DIAGNOSIS — I5022 Chronic systolic (congestive) heart failure: Secondary | ICD-10-CM

## 2024-04-02 NOTE — Assessment & Plan Note (Signed)
 Lipid panel: chol 100, trig 29, HDL 53, LDL 41 Continue crestor  10 mg daily

## 2024-04-02 NOTE — Progress Notes (Signed)
 Called patient phone number with pre-procedure instructions and got generic voicemail with no name given. Reason for calling and callback number was given for further instruction for tomorrow.

## 2024-04-02 NOTE — Assessment & Plan Note (Signed)
 EKG: Atrial fibrillation with rapid ventricular response Rightward axis Incomplete right bundle branch block Cannot rule out Inferior infarct, age undetermined, 129 bpm HR not accurately detected via pulse oximeter probe Presented to ED 03/07/2024 w/ new A. Fib w/ RVR Switched from diltiazem  to bisoprolol  for rate control 2/2 HFmrEF Aside from reported fatigue, patient asymptomatic and cardiac unaware Denies CP, SOB, palpitations, dizziness, near syncope, edema Reports compliance w/ OAC, denies bleeding/bruising issues Continue eliquis  5 mg daily as this is the correct dose for his age (44), weight (87.8 kg), creatinine (0.92) Continue bisoprolol  10 mg daily, take extra dose tonight 2/2 RVR in office Discussed case w/ DOD, Dr. Ren, who agrees with plan to set up TEE/DCCV tomorrow. Given the patient's recent CVA, it is imperative we perform the TEE prior to DCCV to rule out any further clot burden prior to cardioversion to prevent secondary CVA going forward.  Discussed risks and benefits of the procedure w/ the patient and called his wife to update her per patient request, both verbalized understanding and agreement with the plan. There is question of whether patient will need continued high dose bisoprolol  following procedure, will need to be determined once we know heart rate following procedure. If unsuccessful, would plan for referral to EP/A. Fib clinic for further rhythm management options to be discussed. Will plan for patient to keep follow up with Dr. Anner in one month as scheduled, however if he feels any concerning symptoms or see's his heart rate is elevated before then, he is instructed to call the office or proceed to the nearest emergency department.

## 2024-04-02 NOTE — Patient Instructions (Addendum)
 Medication Instructions:  NO CHANGES *If you need a refill on your cardiac medications before your next appointment, please call your pharmacy*  Lab Work: NO LABS If you have labs (blood work) drawn today and your tests are completely normal, you will receive your results only by: MyChart Message (if you have MyChart) OR A paper copy in the mail If you have any lab test that is abnormal or we need to change your treatment, we will call you to review the results.  Testing/Procedures:     Dear Gordon Hayes  You are scheduled for a TEE (Transesophageal Echocardiogram) Guided Cardioversion on Tuesday, December 9 with Dr. Jeffrie.  Please arrive at the [facility] (Main Entrance A) at [facility]: [address] [place], [place] [address] at 10:00 AM (This time is 1 hour(s) before your procedure to ensure your preparation).   Free valet parking service is available. You will check in at ADMITTING.   *Please Note: You will receive a call the day before your procedure to confirm the appointment time. That time may have changed from the original time based on the schedule for that day.*    DIET:  Nothing to eat or drink after midnight except a sip of water with medications (see medication instructions below)  MEDICATION INSTRUCTIONS: !!IF ANY NEW MEDICATIONS ARE STARTED AFTER TODAY, PLEASE NOTIFY YOUR PROVIDER AS SOON AS POSSIBLE!!  FYI: Medications such as Semaglutide (Ozempic, Wegovy), Tirzepatide (Mounjaro, Zepbound), Dulaglutide (Trulicity), etc (GLP1 agonists) AND Canagliflozin (Invokana), Dapagliflozin (Farxiga), Empagliflozin (Jardiance), Ertugliflozin (Steglatro), Bexagliflozin [employer]) or any combination with one of these drugs such as Invokamet (Canagliflozin/Metformin), Synjardy (Empagliflozin/Metformin), etc (SGLT2 inhibitors) must be held around the time of a procedure. This is not a comprehensive list of all of these drugs. Please review all of your medications and  talk to your provider if you take any one of these. If you are not sure, ask your provider.         :1}Continue taking your anticoagulant (blood thinner): Apixaban  (Eliquis ).  You will need to continue this after your procedure until you are told by your provider that it is safe to stop.    TAKE EXTRA DOSE OF BISOPROLOL  TONIGHT 04/02/24. MAY TAKE ALL OTHER REGULAR MEDICATIONS AS INSTRUCTED.  LABS: NO LABS NEEDED    FYI:  For your safety, and to allow us  to monitor your vital signs accurately during the surgery/procedure we request: If you have artificial nails, gel coating, SNS etc, please have those removed prior to your surgery/procedure. Not having the nail coverings /polish removed may result in cancellation or delay of your surgery/procedure.  Your support person will be asked to wait in the waiting room during your procedure.  It is OK to have someone drop you off and come back when you are ready to be discharged.  You cannot drive after the procedure and will need someone to drive you home.  Bring your insurance cards.  *Special Note: Every effort is made to have your procedure done on time. Occasionally there are emergencies that occur at the hospital that may cause delays. Please be patient if a delay does occur.      Follow-Up: At [facility], you and your health needs are our priority.  As part of our continuing mission to provide you with exceptional heart care, our providers are all part of one team.  This team includes your primary Cardiologist (physician) and Advanced Practice Providers or APPs (Physician Assistants and Nurse Practitioners) who  all work together to provide you with the care you need, when you need it.  Your next appointment:   4 week(s)  Provider:   Miriam Shams, NP   Then, Alm Clay, MD will plan to see you again in 4 month(s).

## 2024-04-03 ENCOUNTER — Ambulatory Visit (HOSPITAL_COMMUNITY): Admitting: Certified Registered"

## 2024-04-03 ENCOUNTER — Other Ambulatory Visit: Payer: Self-pay

## 2024-04-03 ENCOUNTER — Encounter (HOSPITAL_COMMUNITY): Admission: RE | Disposition: A | Payer: Self-pay | Source: Home / Self Care | Attending: Cardiology

## 2024-04-03 ENCOUNTER — Ambulatory Visit (HOSPITAL_COMMUNITY)
Admission: RE | Admit: 2024-04-03 | Discharge: 2024-04-03 | Disposition: A | Attending: Cardiology | Admitting: Cardiology

## 2024-04-03 ENCOUNTER — Encounter: Payer: Self-pay | Admitting: Emergency Medicine

## 2024-04-03 ENCOUNTER — Ambulatory Visit (HOSPITAL_COMMUNITY)
Admission: RE | Admit: 2024-04-03 | Discharge: 2024-04-03 | Disposition: A | Source: Ambulatory Visit | Attending: Emergency Medicine | Admitting: Cardiology

## 2024-04-03 DIAGNOSIS — I252 Old myocardial infarction: Secondary | ICD-10-CM | POA: Diagnosis not present

## 2024-04-03 DIAGNOSIS — Z7901 Long term (current) use of anticoagulants: Secondary | ICD-10-CM | POA: Diagnosis not present

## 2024-04-03 DIAGNOSIS — I4891 Unspecified atrial fibrillation: Secondary | ICD-10-CM

## 2024-04-03 DIAGNOSIS — Z8673 Personal history of transient ischemic attack (TIA), and cerebral infarction without residual deficits: Secondary | ICD-10-CM | POA: Diagnosis not present

## 2024-04-03 DIAGNOSIS — I340 Nonrheumatic mitral (valve) insufficiency: Secondary | ICD-10-CM | POA: Diagnosis not present

## 2024-04-03 DIAGNOSIS — Z955 Presence of coronary angioplasty implant and graft: Secondary | ICD-10-CM | POA: Diagnosis not present

## 2024-04-03 DIAGNOSIS — Z79899 Other long term (current) drug therapy: Secondary | ICD-10-CM | POA: Diagnosis not present

## 2024-04-03 DIAGNOSIS — I110 Hypertensive heart disease with heart failure: Secondary | ICD-10-CM | POA: Diagnosis not present

## 2024-04-03 DIAGNOSIS — E785 Hyperlipidemia, unspecified: Secondary | ICD-10-CM | POA: Diagnosis not present

## 2024-04-03 DIAGNOSIS — I5042 Chronic combined systolic (congestive) and diastolic (congestive) heart failure: Secondary | ICD-10-CM | POA: Diagnosis not present

## 2024-04-03 DIAGNOSIS — I2510 Atherosclerotic heart disease of native coronary artery without angina pectoris: Secondary | ICD-10-CM | POA: Diagnosis not present

## 2024-04-03 DIAGNOSIS — Z87891 Personal history of nicotine dependence: Secondary | ICD-10-CM | POA: Diagnosis not present

## 2024-04-03 HISTORY — PX: TRANSESOPHAGEAL ECHOCARDIOGRAM (CATH LAB): EP1270

## 2024-04-03 HISTORY — PX: CARDIOVERSION: EP1203

## 2024-04-03 LAB — ECHO TEE

## 2024-04-03 SURGERY — TRANSESOPHAGEAL ECHOCARDIOGRAM (TEE) (CATHLAB)
Anesthesia: Monitor Anesthesia Care

## 2024-04-03 MED ORDER — PROPOFOL 500 MG/50ML IV EMUL
INTRAVENOUS | Status: DC | PRN
  Administered 2024-04-03: 50 mg via INTRAVENOUS
  Administered 2024-04-03: 125 ug/kg/min via INTRAVENOUS

## 2024-04-03 MED ORDER — SODIUM CHLORIDE 0.9 % IV SOLN: INTRAVENOUS | Status: DC

## 2024-04-03 SURGICAL SUPPLY — 1 items: PAD DEFIB RADIO PHYSIO CONN (PAD) ×1 IMPLANT

## 2024-04-03 NOTE — CV Procedure (Signed)
   Transesophageal Echocardiogram  Indications:AFIB recent stroke  Time out performed  During this procedure the patient was administered propofol  under anesthesiology supervision to achieve and maintain moderate sedation.  The patient's heart rate, blood pressure, and oxygen saturation are monitored continuously during the procedure.   Findings:  Left Ventricle: Reduced ejection fraction 30 to 35%  Mitral Valve: Trivial MR  Aortic Valve: Trileaflet normal  Tricuspid Valve: Mild TR  Left Atrium: Dilated, no left atrial appendage thrombus  Right Atrium: Dilated  Intraatrial septum: Normal, no shunt by color-flow Doppler  Bubble Contrast Study: Not performed  Gordon Parchment, MD     Electrical Cardioversion Procedure Note Gordon Hayes [id number] Dec 07, 1960  Procedure: Electrical Cardioversion Indications:  Atrial Fibrillation  Time Out: Verified patient identification, verified procedure,medications/allergies/relevent history reviewed, required imaging and test results available.  Performed  Procedure Details  The patient was NPO after midnight. Anesthesia was administered at the beside  by Dr.Moser with propofol .  Cardioversion was performed with synchronized biphasic defibrillation via AP pads with 200 joules.  1 attempt(s) were performed.  The patient converted to normal sinus rhythm. The patient tolerated the procedure well   IMPRESSION:  Successful cardioversion of atrial fibrillation     Gordon Hayes 04/03/2024, 11:21 AM

## 2024-04-03 NOTE — Interval H&P Note (Signed)
 History and Physical Interval Note:  04/03/2024 10:58 AM  Gordon Hayes  has presented today for surgery, with the diagnosis of AFIB.  The various methods of treatment have been discussed with the patient and family. After consideration of risks, benefits and other options for treatment, the patient has consented to  Procedure(s): TRANSESOPHAGEAL ECHOCARDIOGRAM (N/A) CARDIOVERSION (N/A) as a surgical intervention.  The patient's history has been reviewed, patient examined, no change in status, stable for surgery.  I have reviewed the patient's chart and labs.  Questions were answered to the patient's satisfaction.     [employer]

## 2024-04-03 NOTE — Transfer of Care (Signed)
 Immediate Anesthesia Transfer of Care Note  Patient: Gordon Hayes  Procedure(s) Performed: TRANSESOPHAGEAL ECHOCARDIOGRAM CARDIOVERSION  Patient Location: Cath Lab  Anesthesia Type:MAC and General  Level of Consciousness: awake, alert , oriented, and patient cooperative  Airway & Oxygen Therapy: Patient Spontanous Breathing and Patient connected to nasal cannula oxygen  Post-op Assessment: Report given to RN and Post -op Vital signs reviewed and stable  Post vital signs: Reviewed and stable  Last Vitals:  Vitals Value Taken Time  BP    Temp 36.8 C 04/03/24 11:29  Pulse 48 04/03/24 11:29  Resp    SpO2 94% 04/03/24 11:29    Last Pain:  Vitals:   04/03/24 1129  TempSrc: Temporal  PainSc: 0-No pain         Complications: No notable events documented.

## 2024-04-03 NOTE — Anesthesia Preprocedure Evaluation (Signed)
 Anesthesia Evaluation  Patient identified by MRN, date of birth, ID band Patient awake    Reviewed: Allergy & Precautions, NPO status , Patient's Chart, lab work & pertinent test results  History of Anesthesia Complications Negative for: history of anesthetic complications  Airway Mallampati: II  TM Distance: >3 FB     Dental  (+) Upper Dentures, Dental Advisory Given   Pulmonary COPD, former smoker   breath sounds clear to auscultation       Cardiovascular hypertension, Pt. on medications and Pt. on home beta blockers + CAD, + Past MI and +CHF   Rhythm:Irregular  Left ventricle: The cavity size was mildly dilated. Wall    thickness was increased in a pattern of mild LVH. Systolic    function was normal. The estimated ejection fraction was in the    range of 55% to 60%. Wall motion was normal; there were no    regional wall motion abnormalities. Doppler parameters are    consistent with abnormal left ventricular relaxation (grade 1    diastolic dysfunction).     Neuro/Psych  Neuromuscular disease    GI/Hepatic negative GI ROS, Neg liver ROS,,,  Endo/Other  negative endocrine ROS    Renal/GU negative Renal ROS     Musculoskeletal  (+) Arthritis ,    Abdominal   Peds  Hematology  (+) Blood dyscrasia Lab Results      Component                Value               Date                      WBC                      6.2                 03/08/2024                HGB                      14.8                03/08/2024                HCT                      44.8                03/08/2024                MCV                      91.6                03/08/2024                PLT                      178                 03/08/2024              Anesthesia Other Findings   Reproductive/Obstetrics                              Anesthesia  Physical Anesthesia Plan  ASA: 2  Anesthesia Plan: MAC and  General   Post-op Pain Management: Minimal or no pain anticipated   Induction: Intravenous  PONV Risk Score and Plan: 2 and Propofol  infusion and Treatment may vary due to age or medical condition  Airway Management Planned: Nasal Cannula, Natural Airway and Simple Face Mask  Additional Equipment: None  Intra-op Plan:   Post-operative Plan:   Informed Consent: I have reviewed the patients History and Physical, chart, labs and discussed the procedure including the risks, benefits and alternatives for the proposed anesthesia with the patient or authorized representative who has indicated his/her understanding and acceptance.     Dental advisory given  Plan Discussed with: CRNA  Anesthesia Plan Comments:          Anesthesia Quick Evaluation

## 2024-04-04 ENCOUNTER — Encounter: Payer: Self-pay | Admitting: Emergency Medicine

## 2024-04-04 ENCOUNTER — Ambulatory Visit: Payer: Self-pay | Admitting: Emergency Medicine

## 2024-04-04 NOTE — Telephone Encounter (Signed)
 Please advise regarding HR. Thank you

## 2024-04-05 ENCOUNTER — Encounter (HOSPITAL_COMMUNITY): Payer: Self-pay | Admitting: Cardiology

## 2024-04-05 NOTE — Telephone Encounter (Signed)
 Pt wife called in to schedule a f/u appt. I dont see any opening prior to the day he is already scheduled to see Dr. Anner 05/01/24, unless someone cancels. Please advise as pt wife is adamant they need to be seen before that.

## 2024-04-05 NOTE — Anesthesia Postprocedure Evaluation (Signed)
 Anesthesia Post Note  Patient: Gordon Hayes  Procedure(s) Performed: TRANSESOPHAGEAL ECHOCARDIOGRAM CARDIOVERSION     Patient location during evaluation: Cath Lab Anesthesia Type: General Level of consciousness: awake and alert Pain management: pain level controlled Vital Signs Assessment: post-procedure vital signs reviewed and stable Respiratory status: spontaneous breathing, nonlabored ventilation and respiratory function stable Cardiovascular status: blood pressure returned to baseline and stable Postop Assessment: no apparent nausea or vomiting Anesthetic complications: no   No notable events documented.                 [name]

## 2024-04-10 NOTE — Progress Notes (Unsigned)
 [name] [address] [place]. [place] [address] [phone]       HOSPITAL FOLLOW UP NOTE  [name] Date of Birth: November 09, 1960 Medical Record Number: [id number]   Reason for Referral:  hospital stroke follow up    SUBJECTIVE:   CHIEF COMPLAINT:  No chief complaint on file.   HPI:   Gordon Hayes is a 63 y.o. who  has a past medical history of Back pain, lumbosacral, CAD S/P percutaneous coronary angioplasty (03/2015), Carpal tunnel syndrome, bilateral, COPD (chronic obstructive pulmonary disease) (HCC) (2013), DJD (degenerative joint disease) of knee, and STEMI (ST elevation myocardial infarction) (HCC) ( 03/31/2015).  Patient presented on 03/07/2024 with sudden onset right sided weakness, aphasia and headache. Symptoms resolved spontaneously and he was not treated with TNK. Afib noted on cardiac monitor. IV heparin  started. MRI showed multiple small strokes in left frontal lobe. He was transitioned to Eliquis . Rosuvastatin  10mg  continued. LDL 41. A1C 5.8. ETOH 32. No therapy recommendations. He was discharged home. Personally reviewed hospitalization pertinent progress notes, lab work and imaging.  Evaluated by Dr Jerri.   Since discharge,   TEE with cardioversion 04/03/2024. No thrombus. He reports feeling better following cardioversion. BP has been . HR .   Alcohol?   PERTINENT IMAGING/LABS  Code Stroke CT head No acute abnormality. ASPECTS 10.    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, moderate stenosis of right paraclinoid ICA, mild stenosis of right vertebral artery origin MRI few small foci of diffusion signal abnormality involving the left insular cortex and overlying left frontal lobe TEE: EF 30-35%  A1C Lab Results  Component Value Date   HGBA1C 5.8 (H) 03/07/2024    Lipid Panel     Component Value Date/Time   CHOL 100 03/08/2024 0427   CHOL 110  04/07/2023 1200   TRIG 29 03/08/2024 0427   HDL 53 03/08/2024 0427   HDL 54 04/07/2023 1200   CHOLHDL 1.9 03/08/2024 0427   VLDL 6 03/08/2024 0427   LDLCALC 41 03/08/2024 0427   LDLCALC 45 04/07/2023 1200   LABVLDL 11 04/07/2023 1200      ROS:   14 system review of systems performed and negative with exception of those listed in HPI  PMH:  Past Medical History:  Diagnosis Date   Back pain, lumbosacral    CAD S/P percutaneous coronary angioplasty 03/2015   OM2 99>0% w/ 2.75 mm x 16 mm (3.2 mm) Synergy DES; inferior OM2 branch 80%, med rx, pRCA 70%, mRCA 90% (s/p staged PCI w/ 2. 75 X 33 mm Xience Alpine DES);    Carpal tunnel syndrome, bilateral    COPD (chronic obstructive pulmonary disease) (HCC) 2013   DJD (degenerative joint disease) of knee    STEMI (ST elevation myocardial infarction) (HCC)  03/31/2015   Subtotal OM2 - PCI with DES. EF 45-50%; Staged PCI of RCA    PSH:  Past Surgical History:  Procedure Laterality Date   CARDIAC CATHETERIZATION N/A 03/31/2015   Procedure: Left Heart Cath and Coronary Angiography;  Surgeon: Alm LELON Clay, MD;  Location: [facility] INVASIVE CV LAB;  Service: Cardiovascular;  Subtotoal OM2, 85% RCA with diffuse upstream Dz. EF 45-50%   CARDIAC CATHETERIZATION N/A 03/31/2015   Procedure: Coronary Stent Intervention;  Surgeon: Alm LELON Clay, MD;  Location: [facility] INVASIVE CV LAB;: OM2 99% - Synergy DES 2.75 mm x 16 mm (3.2 mm).    CARDIAC CATHETERIZATION N/A  04/02/2015   Procedure: Coronary Stent Intervention;  Surgeon: Deatrice DELENA Cage, MD;  Location: [facility] INVASIVE CV LAB: STAGED PCI  of RCA  90% - Xience DES 2.75 mm x 33 mm   CARDIOVERSION N/A 04/03/2024   Procedure: CARDIOVERSION;  Surgeon: Jeffrie Oneil BROCKS, MD;  Location: [facility] INVASIVE CV LAB;  Service: Cardiovascular;  Laterality: N/A;   HERNIA REPAIR     right inguinal   POSTERIOR LAMINECTOMY / DECOMPRESSION LUMBAR SPINE  2007   TONSILLECTOMY     TRANSESOPHAGEAL ECHOCARDIOGRAM (CATH LAB) N/A 04/03/2024    Procedure: TRANSESOPHAGEAL ECHOCARDIOGRAM;  Surgeon: Jeffrie Oneil BROCKS, MD;  Location: [facility] INVASIVE CV LAB;  Service: Cardiovascular;  Laterality: N/A;   TRANSTHORACIC ECHOCARDIOGRAM  06/2015   EF 55-60%, mild LVH. Normal wall motion. Gr 1 DD.     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  I don't have my mind right to do it yet.  Substance and Sexual Activity   Alcohol use: Yes    Alcohol/week: 0.0 - 4.0 standard drinks of alcohol   Drug use: No   Sexual activity: Yes    Partners: Female  Other Topics Concern   Not on file  Social History Narrative   Denied application for additional life insurance policy due to recent diagnosis of COPD.   Social Drivers of Health   Tobacco Use: Medium Risk (04/04/2024)   Patient History    Smoking Tobacco Use: Former    Smokeless Tobacco Use: Former    Passive Exposure: Not on Stage Manager: Not on file  Food Insecurity: No Food Insecurity (03/08/2024)   Epic    Worried About Programme Researcher, Broadcasting/film/video in the Last Year: Never true    Ran Out of Food in the Last Year: Never true  Transportation Needs: No Transportation Needs (03/08/2024)   Epic    Lack of Transportation (Medical): No    Lack of Transportation (Non-Medical): No  Physical Activity: Not on file  Stress: Not on file  Social Connections: Not on file  Intimate Partner Violence: Not At Risk (03/08/2024)   Epic     Fear of Current or Ex-Partner: No    Emotionally Abused: No    Physically Abused: No    Sexually Abused: No  Depression (PHQ2-9): Low Risk (07/21/2023)   Depression (PHQ2-9)    PHQ-2 Score: 0  Alcohol Screen: Not on file  Housing: Low Risk (03/08/2024)   Epic    Unable to Pay for Housing in the Last Year: No    Number of Times Moved in the Last Year: 0    Homeless in the Last Year: No  Utilities: Not At Risk (03/08/2024)   Epic    Threatened with loss of utilities: No  Health Literacy: Not on file    Family History:  Family History  Problem Relation Age of Onset   Hearing loss Mother    Hypertension Mother    Arthritis Mother    Cancer Father 37       prostate, bladder   Heart disease Father    Stroke Father    Heart attack Father        2   Cancer Maternal Grandfather    Cancer Paternal Grandmother    Heart disease Paternal Grandfather    Colon cancer Neg Hx    Esophageal cancer Neg Hx    Rectal cancer Neg Hx    Stomach cancer Neg Hx     Medications:  Medications Ordered Prior to Encounter[1]  Allergies:  Allergies[2]    OBJECTIVE:  Physical Exam  There were no vitals filed for this visit. There is no height or weight on file to calculate BMI. No results found.     07/21/2023    8:14 AM  Depression screen PHQ 2/9  Decreased Interest 0  Down, Depressed, Hopeless 0  PHQ - 2 Score 0  Altered sleeping 0  Tired, decreased energy 0  Change in appetite 0  Feeling bad or failure about yourself  0  Trouble concentrating 0  Moving slowly or fidgety/restless 0  Suicidal thoughts 0  PHQ-9 Score 0   Difficult doing work/chores Not difficult at all     Data saved with a previous flowsheet row definition     General: well developed, well nourished, seated, in no evident distress Head: head normocephalic and atraumatic.   Neck: supple with no carotid or supraclavicular bruits Cardiovascular: regular rate and rhythm, no murmurs Musculoskeletal: no  deformity Skin:  no rash/petichiae Vascular:  Normal pulses all extremities   Neurologic Exam Mental Status: Awake and fully alert.  Fluent speech and language.  Oriented to place and time. Recent and remote memory intact. Attention span, concentration and fund of knowledge appropriate. Mood and affect appropriate.  Cranial Nerves: Fundoscopic exam reveals sharp disc margins. Pupils equal, briskly reactive to light. Extraocular movements full without nystagmus. Visual fields full to confrontation. Hearing intact. Facial sensation intact. Face, tongue, palate moves normally and symmetrically.  Motor: Normal bulk and tone. Normal strength in all tested extremity muscles Sensory.: intact to touch , pinprick , position and vibratory sensation.  Coordination: Rapid alternating movements normal in all extremities. Finger-to-nose and heel-to-shin performed accurately bilaterally. Gait and Station: Arises from chair without difficulty. Stance is normal. Gait demonstrates normal stride length and balance with ***. Tandem walk and heel toe ***.  Reflexes: 1+ and symmetric.    NIHSS  *** Modified Rankin  ***    ASSESSMENT: FARD BORUNDA is a 63 y.o. year old male presenting with sudden onset right sided weakness, aphasia and headache. Vascular risk factors include HTN, HLD, prediabetes, atrial fib, ETOH.SABRA      PLAN:  Stroke:  left frontal strokes, etiology: Likely embolic in the setting of newly diagnosed A-fib: Residual deficit: ***. Continue Eliquis  (apixaban ) daily and rosuvastatin  10mg  daily for secondary stroke prevention. Discussed secondary stroke prevention measures and importance of close PCP follow up for aggressive stroke risk factor management. I have gone over the pathophysiology of stroke, warning signs and symptoms, risk factors and their management in some detail with instructions to go to the closest emergency room for symptoms of concern. HTN: BP goal <130/90.  Stable on  carvedilol  6.25mg  BID. Continue to monitor per PCP HLD: LDL goal <70. Recent LDL 41. Continue rosuvastatin  10mg  daily per PCP.  DMII: A1c  goal<7.0. Recent A1c 5.8. Prediabetes. Continue to monitor closely per PCP. Well balanced diet and regular exercise advised.  Atrial fib: Continue Eliquis  and Cardizem  per PCP and cardiology.  ETOH: advised to drink in moderation, 1-2 drinks per day.    Follow up in *** or call earlier if needed   CC:  [facility] provider: Dr. Rosemarie PCP: Chandra Toribio POUR, MD    I spent *** minutes of face-to-face and non-face-to-face time with patient.  This included previsit chart review including review of recent hospitalization, lab review, study review, order entry, electronic health record documentation, patient education regarding recent stroke including etiology, secondary stroke prevention measures and importance of managing stroke risk factors, residual deficits and typical recovery time and answered all other questions to patient satisfaction   Greig Forbes, [facility]  [facility] [address] [place], [place] [address]  Phone [phone] Fax [phone] Note: This document was prepared with digital dictation and possible smart phrase technology. Any transcriptional errors that result from this process are unintentional.          [1]  Current Outpatient Medications on File Prior to Visit  Medication Sig Dispense Refill   acetaminophen  (TYLENOL ) 500 MG tablet Take 1,000 mg by mouth every 6 (six) hours as needed for mild pain (pain score 1-3) (arthritis pain).     apixaban  (ELIQUIS ) 5 MG TABS tablet Take 1 tablet (5 mg total) by mouth 2 (two) times daily. 180 tablet 3   bisoprolol  (ZEBETA ) 10 MG tablet Take 1 tablet (10 mg total) by mouth daily. 90 tablet 3   nitroGLYCERIN  (NITROSTAT ) 0.4 MG SL tablet DISSOLVE 1 TABLET UNDER THE TONGUE EVERY 5 MINUTES FOR UP TO 3 DOSES AS NEEDED FOR CHEST PAIN. IF NO RELIEF AFTER 3 DOSES, CALL 911  OR GO TO ER. 25 tablet 3   rosuvastatin  (CRESTOR ) 10 MG tablet Take 1 tablet (10 mg total) by mouth every evening. 90 tablet 3   No current facility-administered medications on file prior to visit.  [2]  Allergies Allergen Reactions   Penicillins Anaphylaxis   Atorvastatin  Other (See Comments)    myalgias

## 2024-04-10 NOTE — Patient Instructions (Signed)
 Below is our plan:  Stroke:left frontal strokes, etiology: Likely embolic in the setting of newly diagnosed A-fib: Residual deficit: none. Continue Eliquis  (apixaban ) daily and rosuvastatin  10mg  daily for secondary stroke prevention. Discussed secondary stroke prevention measures and importance of close PCP follow up for aggressive stroke risk factor management. I have gone over the pathophysiology of stroke, warning signs and symptoms, risk factors and their management in some detail with instructions to go to the closest emergency room for symptoms of concern. HTN: BP goal <130/90.  Stable on carvedilol  6.25mg  BID. Continue to monitor per PCP HLD: LDL goal <70. Recent LDL 41. Continue rosuvastatin  10mg  daily per PCP.  DMII: A1c goal<7.0. Recent A1c 5.8. Prediabetes. Continue to monitor closely per PCP. Well balanced diet and regular exercise advised.  Atrial fib: Continue Eliquis  and Cardizem  per PCP and cardiology.  ETOH: reports he may drink 1-2 per week prior to stroke. No longer drinking alcohol.   Goals:  1) Maintain strict control of hypertension with blood pressure goal below 130/90 2) Maintain good control of diabetes with hemoglobin A1c goal below 7%  3) Maintain good control of lipids with LDL cholesterol goal below 70 mg/dL.  4) Eat a healthy diet with plenty of whole grains, cereals, fruits and vegetables, exercise regularly and maintain ideal body weight   Resources: https://[email or web address].williams.biz/  Please make sure you are staying well hydrated. I recommend 50-60 ounces daily. Well balanced diet and regular exercise encouraged. Consistent sleep schedule with 6-8 hours recommended.   Please continue follow up with care team as directed.   Follow up with me as needed   You may receive a survey regarding today's visit. I encourage you to leave honest feed back as I do use this information to improve patient care. Thank  you for seeing me today!

## 2024-04-11 ENCOUNTER — Ambulatory Visit: Admitting: Family Medicine

## 2024-04-11 ENCOUNTER — Encounter: Payer: Self-pay | Admitting: Family Medicine

## 2024-04-11 VITALS — BP 138/82 | HR 56 | Resp 14 | Ht 70.0 in

## 2024-04-11 DIAGNOSIS — I639 Cerebral infarction, unspecified: Secondary | ICD-10-CM

## 2024-04-12 ENCOUNTER — Other Ambulatory Visit (HOSPITAL_COMMUNITY): Payer: Self-pay

## 2024-04-12 ENCOUNTER — Ambulatory Visit: Attending: Physician Assistant | Admitting: Physician Assistant

## 2024-04-12 ENCOUNTER — Encounter: Payer: Self-pay | Admitting: Physician Assistant

## 2024-04-12 VITALS — BP 124/79 | HR 51 | Ht 70.0 in | Wt 194.4 lb

## 2024-04-12 DIAGNOSIS — I4819 Other persistent atrial fibrillation: Secondary | ICD-10-CM

## 2024-04-12 DIAGNOSIS — I480 Paroxysmal atrial fibrillation: Secondary | ICD-10-CM

## 2024-04-12 DIAGNOSIS — Z79899 Other long term (current) drug therapy: Secondary | ICD-10-CM

## 2024-04-12 DIAGNOSIS — E785 Hyperlipidemia, unspecified: Secondary | ICD-10-CM

## 2024-04-12 DIAGNOSIS — I2510 Atherosclerotic heart disease of native coronary artery without angina pectoris: Secondary | ICD-10-CM

## 2024-04-12 DIAGNOSIS — Z8673 Personal history of transient ischemic attack (TIA), and cerebral infarction without residual deficits: Secondary | ICD-10-CM | POA: Diagnosis not present

## 2024-04-12 DIAGNOSIS — I5020 Unspecified systolic (congestive) heart failure: Secondary | ICD-10-CM | POA: Diagnosis not present

## 2024-04-12 DIAGNOSIS — I5032 Chronic diastolic (congestive) heart failure: Secondary | ICD-10-CM

## 2024-04-12 DIAGNOSIS — Z9861 Coronary angioplasty status: Secondary | ICD-10-CM

## 2024-04-12 MED ORDER — LOSARTAN POTASSIUM 25 MG PO TABS
25.0000 mg | ORAL_TABLET | Freq: Every day | ORAL | 3 refills | Status: AC
  Filled 2024-04-12: qty 30, 30d supply, fill #0
  Filled 2024-05-07: qty 90, 90d supply, fill #1

## 2024-04-12 MED ORDER — DAPAGLIFLOZIN PROPANEDIOL 10 MG PO TABS
10.0000 mg | ORAL_TABLET | Freq: Every day | ORAL | 3 refills | Status: AC
  Filled 2024-04-12: qty 30, 30d supply, fill #0
  Filled 2024-05-07: qty 90, 90d supply, fill #1

## 2024-04-12 NOTE — Patient Instructions (Signed)
 Medication Instructions:  START LOSARTAN  25 MG DAILY  START FARXIGA  10 MG DAILY  *If you need a refill on your cardiac medications before your next appointment, please call your pharmacy*  Lab Work: BMET IN 2 WEEKS If you have labs (blood work) drawn today and your tests are completely normal, you will receive your results only by: MyChart Message (if you have MyChart) OR A paper copy in the mail If you have any lab test that is abnormal or we need to change your treatment, we will call you to review the results.  Testing/Procedures:[address] IN 3 MONTHS Your physician has requested that you have an echocardiogram. Echocardiography is a painless test that uses sound waves to create images of your heart. It provides your doctor with information about the size and shape of your heart and how well your hearts chambers and valves are working. This procedure takes approximately one hour. There are no restrictions for this procedure. Please do NOT wear cologne, perfume, aftershave, or lotions (deodorant is allowed). Please arrive 15 minutes prior to your appointment time.  Please note: We ask at that you not bring children with you during ultrasound (echo/ vascular) testing. Due to room size and safety concerns, children are not allowed in the ultrasound rooms during exams. Our front office staff cannot provide observation of children in our lobby area while testing is being conducted. An adult accompanying a patient to their appointment will only be allowed in the ultrasound room at the discretion of the ultrasound technician under special circumstances. We apologize for any inconvenience.   Follow-Up: At [facility], you and your health needs are our priority.  As part of our continuing mission to provide you with exceptional heart care, our providers are all part of one team.  This team includes your primary Cardiologist (physician) and Advanced Practice Providers or APPs  (Physician Assistants and Nurse Practitioners) who all work together to provide you with the care you need, when you need it.  Your next appointment:   3 month(s) AFTER ECHOCARDIOGRAM  Provider:   Alm Clay, MD

## 2024-04-12 NOTE — Progress Notes (Unsigned)
 Cardiology Office Note   Date:  04/12/2024  ID:  Gordon Hayes, DOB [birth year], MRN [id number] PCP: Chandra Toribio POUR, MD  [facility] Providers Cardiologist:  Alm Clay, MD { Click to update primary MD,subspecialty MD or APP then REFRESH:1}    History of Present Illness Gordon Hayes is a 63 y.o. male was atrial fibrillation, CAD, chronic diastolic heart failure, hypertension and hyperlipidemia.  He was hospitalized in December 2016 with STEMI.  He underwent DES to left circumflex/OM 2 and a DES to RCA.  EF was 45 to 50% at time.  He also had 80% stenosis in the inferior branch of OM2 that was too small for PCI.  Echocardiogram in March 2017 showed EF improved to 55 to 60%, normal LV function, no regional wall motion abnormality, grade 1 DD, mild LVH, no significant valve issue.  He presented to the ED in November 2025 with trouble speaking, weakness in the right arm, right-sided headache, right-sided visual changes.  Symptom mostly resolved on ED arrival.  EKG showed atrial fibrillation with RVR which is new for him.  He was placed on Cardizem .  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  MRI showed small foci of diffuse signal abnormality in the left insular cortex and the left frontal lobe.  BMP 477.9.  Echocardiogram showed EF 40 to 45% with mildly decreased LV function, global hypokinesis, diastolic parameter could not be evaluated, normal RV, mild to moderate MR, moderate to severe annular calcification.  Patient had negative bubble study with no evidence of intra-atrial shunt.  Cardizem  was switched to bisoprolol  secondary to decreased EF.  He was started on Eliquis  by neurology service with plan for outpatient DCCV, sleep apnea study, ARB and possible MRA.  He was seen by Miriam Shams on 04/02/2024 at which time he was stable.  Heart rate was about 120 bpm  at the time.  He was able to undergo TEE DCCV on 04/03/2024.  TEE showed EF 30 to 35%, mild TR, no left atrial appendage or thrombus.  He underwent successful cardioversion for atrial fibrillation.  Since cardioversion, he is doing very well.  He has no lower extremity edema, orthopnea or PND.  I will add losartan  25 mg daily and Farxiga  10 mg daily for LV dysfunction.  He will need a basic metabolic panel in 2 weeks.  He can follow-up with Dr. Clay in 3 to 4 months.  Prior to his next follow-up, we will time a limited echocardiogram to reassess ejection fraction.  ROS: ***  Studies Reviewed      *** Risk Assessment/Calculations {Does this patient have ATRIAL FIBRILLATION?[phone]}         Physical Exam VS:  BP 124/79 (BP Location: Right Arm, Patient Position: Sitting, Cuff Size: Large)   Pulse (!) 51   Ht 5' 10 (1.778 m)   Wt 194 lb 6.4 oz (88.2 kg)   SpO2 93%   BMI 27.89 kg/m        Wt Readings from Last 3 Encounters:  04/12/24 194 lb 6.4 oz (88.2 kg)  04/03/24 190 lb (86.2 kg)  04/02/24 193 lb 9.6 oz (87.8 kg)    GEN: Well nourished, well developed in no acute distress NECK: No JVD; No carotid bruits CARDIAC: ***RRR, no murmurs, rubs, gallops RESPIRATORY:  Clear to auscultation without rales, wheezing or rhonchi  ABDOMEN: Soft, non-tender, non-distended EXTREMITIES:  No edema;  No deformity   ASSESSMENT AND PLAN ***    {Are you ordering a CV Procedure (e.g. stress test, cath, DCCV, TEE, etc)?   Press F2        :[id number]}  Dispo: ***  Signed, Scot Ford, PA

## 2024-04-25 LAB — BASIC METABOLIC PANEL WITH GFR
BUN/Creatinine Ratio: 14 (ref 10–24)
BUN: 14 mg/dL (ref 8–27)
CO2: 22 mmol/L (ref 20–29)
Calcium: 9.3 mg/dL (ref 8.6–10.2)
Chloride: 103 mmol/L (ref 96–106)
Creatinine, Ser: 0.98 mg/dL (ref 0.76–1.27)
Glucose: 78 mg/dL (ref 70–99)
Potassium: 4.7 mmol/L (ref 3.5–5.2)
Sodium: 139 mmol/L (ref 134–144)
eGFR: 87 mL/min/1.73

## 2024-05-01 ENCOUNTER — Ambulatory Visit: Admitting: Cardiology

## 2024-05-01 ENCOUNTER — Ambulatory Visit: Payer: Self-pay | Admitting: Physician Assistant

## 2024-05-04 ENCOUNTER — Ambulatory Visit: Admitting: Emergency Medicine

## 2024-05-07 ENCOUNTER — Other Ambulatory Visit (HOSPITAL_COMMUNITY): Payer: Self-pay

## 2024-05-14 ENCOUNTER — Telehealth: Payer: Self-pay

## 2024-05-14 NOTE — Telephone Encounter (Signed)
 Copied from CRM [id number]. Topic: Clinical - Lab/Test Results >> May 14, 2024 12:15 PM Zy'onna H wrote: Reason for CRM: The patients wife called in to return the phone call of Jason Falling, [place].   The patient has declined the flu shot at this time, and she stated he will get that at a later date at a local pharmacy.

## 2024-05-14 NOTE — Telephone Encounter (Signed)
 Called patient LVM if pt calls please advised about the Flu vaccine

## 2024-06-26 ENCOUNTER — Ambulatory Visit (HOSPITAL_COMMUNITY)

## 2024-07-16 ENCOUNTER — Ambulatory Visit: Admitting: Cardiology

## 2024-07-19 ENCOUNTER — Other Ambulatory Visit

## 2024-07-26 ENCOUNTER — Encounter: Admitting: Family Medicine
